# Patient Record
Sex: Female | Born: 1940
Health system: Southern US, Community
[De-identification: ages and names within clinical notes are randomized; demographics above are authoritative.]

## PROBLEM LIST (undated history)

## (undated) DIAGNOSIS — E785 Hyperlipidemia, unspecified: Secondary | ICD-10-CM

## (undated) DIAGNOSIS — F329 Major depressive disorder, single episode, unspecified: Secondary | ICD-10-CM

## (undated) DIAGNOSIS — F32A Depression, unspecified: Secondary | ICD-10-CM

## (undated) DIAGNOSIS — T7840XA Allergy, unspecified, initial encounter: Secondary | ICD-10-CM

## (undated) DIAGNOSIS — I10 Essential (primary) hypertension: Secondary | ICD-10-CM

## (undated) HISTORY — DX: Allergy, unspecified, initial encounter: T78.40XA

## (undated) HISTORY — PX: FRACTURE SURGERY: SHX138

## (undated) HISTORY — PX: TUBAL LIGATION: SHX77

## (undated) HISTORY — PX: DILATION AND CURETTAGE OF UTERUS: SHX78

## (undated) HISTORY — DX: Major depressive disorder, single episode, unspecified: F32.9

## (undated) HISTORY — DX: Depression, unspecified: F32.A

## (undated) HISTORY — PX: OTHER SURGICAL HISTORY: SHX169

---

## 1979-05-22 HISTORY — PX: OTHER SURGICAL HISTORY: SHX169

## 2005-07-06 ENCOUNTER — Ambulatory Visit: Payer: Self-pay | Admitting: Family Medicine

## 2005-07-20 ENCOUNTER — Ambulatory Visit: Payer: Self-pay | Admitting: Family Medicine

## 2005-07-21 HISTORY — PX: GASTROSCOPY: SHX1701

## 2007-03-15 LAB — HM COLONOSCOPY: HM Colonoscopy: NORMAL

## 2007-05-03 ENCOUNTER — Ambulatory Visit (HOSPITAL_COMMUNITY): Payer: Self-pay | Admitting: Psychiatry

## 2007-05-11 ENCOUNTER — Ambulatory Visit (HOSPITAL_COMMUNITY): Payer: Self-pay | Admitting: Psychiatry

## 2008-01-23 ENCOUNTER — Encounter: Payer: Self-pay | Admitting: Family Medicine

## 2008-02-28 ENCOUNTER — Ambulatory Visit (HOSPITAL_COMMUNITY): Payer: Self-pay | Admitting: Psychiatry

## 2008-03-19 ENCOUNTER — Ambulatory Visit (HOSPITAL_COMMUNITY): Payer: Self-pay | Admitting: Psychiatry

## 2008-03-26 ENCOUNTER — Ambulatory Visit (HOSPITAL_COMMUNITY): Payer: Self-pay | Admitting: Psychiatry

## 2008-03-28 ENCOUNTER — Ambulatory Visit (HOSPITAL_COMMUNITY): Payer: Self-pay | Admitting: Psychiatry

## 2008-04-03 ENCOUNTER — Encounter (INDEPENDENT_AMBULATORY_CARE_PROVIDER_SITE_OTHER): Payer: Self-pay | Admitting: *Deleted

## 2008-04-03 ENCOUNTER — Ambulatory Visit: Payer: Self-pay | Admitting: Family Medicine

## 2008-04-03 ENCOUNTER — Telehealth: Payer: Self-pay | Admitting: Family Medicine

## 2008-04-03 DIAGNOSIS — G43009 Migraine without aura, not intractable, without status migrainosus: Secondary | ICD-10-CM | POA: Insufficient documentation

## 2008-04-03 DIAGNOSIS — K5730 Diverticulosis of large intestine without perforation or abscess without bleeding: Secondary | ICD-10-CM | POA: Insufficient documentation

## 2008-04-03 DIAGNOSIS — K449 Diaphragmatic hernia without obstruction or gangrene: Secondary | ICD-10-CM | POA: Insufficient documentation

## 2008-04-03 DIAGNOSIS — E559 Vitamin D deficiency, unspecified: Secondary | ICD-10-CM | POA: Insufficient documentation

## 2008-04-03 DIAGNOSIS — N183 Chronic kidney disease, stage 3 unspecified: Secondary | ICD-10-CM | POA: Insufficient documentation

## 2008-04-03 DIAGNOSIS — M949 Disorder of cartilage, unspecified: Secondary | ICD-10-CM

## 2008-04-03 DIAGNOSIS — R609 Edema, unspecified: Secondary | ICD-10-CM | POA: Insufficient documentation

## 2008-04-03 DIAGNOSIS — E039 Hypothyroidism, unspecified: Secondary | ICD-10-CM | POA: Insufficient documentation

## 2008-04-03 DIAGNOSIS — M1990 Unspecified osteoarthritis, unspecified site: Secondary | ICD-10-CM | POA: Insufficient documentation

## 2008-04-03 DIAGNOSIS — K219 Gastro-esophageal reflux disease without esophagitis: Secondary | ICD-10-CM | POA: Insufficient documentation

## 2008-04-03 DIAGNOSIS — M899 Disorder of bone, unspecified: Secondary | ICD-10-CM | POA: Insufficient documentation

## 2008-04-03 DIAGNOSIS — A6000 Herpesviral infection of urogenital system, unspecified: Secondary | ICD-10-CM | POA: Insufficient documentation

## 2008-04-03 DIAGNOSIS — R110 Nausea: Secondary | ICD-10-CM | POA: Insufficient documentation

## 2008-04-03 DIAGNOSIS — R1013 Epigastric pain: Secondary | ICD-10-CM | POA: Insufficient documentation

## 2008-04-03 DIAGNOSIS — M810 Age-related osteoporosis without current pathological fracture: Secondary | ICD-10-CM | POA: Insufficient documentation

## 2008-04-03 DIAGNOSIS — I10 Essential (primary) hypertension: Secondary | ICD-10-CM | POA: Insufficient documentation

## 2008-04-04 LAB — CONVERTED CEMR LAB
Albumin: 4.4 g/dL (ref 3.5–5.2)
BUN: 11 mg/dL (ref 6–23)
CO2: 19 meq/L (ref 19–32)
Calcium: 9.4 mg/dL (ref 8.4–10.5)
Chloride: 106 meq/L (ref 96–112)
Glucose, Bld: 88 mg/dL (ref 70–99)
Lipase: 35 units/L (ref 0–75)
Potassium: 4.8 meq/L (ref 3.5–5.3)
Sodium: 140 meq/L (ref 135–145)
TSH: 2.252 microintl units/mL (ref 0.350–4.50)
Total Protein: 7.1 g/dL (ref 6.0–8.3)

## 2008-04-05 ENCOUNTER — Ambulatory Visit (HOSPITAL_COMMUNITY): Payer: Self-pay | Admitting: Psychiatry

## 2008-04-09 ENCOUNTER — Encounter: Admission: RE | Admit: 2008-04-09 | Discharge: 2008-04-09 | Payer: Self-pay | Admitting: Family Medicine

## 2008-04-15 ENCOUNTER — Encounter: Admission: RE | Admit: 2008-04-15 | Discharge: 2008-04-15 | Payer: Self-pay | Admitting: Family Medicine

## 2008-04-19 ENCOUNTER — Telehealth: Payer: Self-pay | Admitting: Family Medicine

## 2008-04-19 ENCOUNTER — Ambulatory Visit (HOSPITAL_COMMUNITY): Payer: Self-pay | Admitting: Psychiatry

## 2008-04-30 ENCOUNTER — Ambulatory Visit (HOSPITAL_COMMUNITY): Payer: Self-pay | Admitting: Psychiatry

## 2008-05-08 ENCOUNTER — Ambulatory Visit (HOSPITAL_COMMUNITY): Payer: Self-pay | Admitting: Psychiatry

## 2008-05-22 ENCOUNTER — Ambulatory Visit (HOSPITAL_COMMUNITY): Payer: Self-pay | Admitting: Psychiatry

## 2008-06-05 ENCOUNTER — Ambulatory Visit (HOSPITAL_COMMUNITY): Payer: Self-pay | Admitting: Psychiatry

## 2008-06-07 ENCOUNTER — Ambulatory Visit (HOSPITAL_COMMUNITY): Payer: Self-pay | Admitting: Psychiatry

## 2008-06-26 ENCOUNTER — Ambulatory Visit (HOSPITAL_COMMUNITY): Payer: Self-pay | Admitting: Psychiatry

## 2008-07-10 ENCOUNTER — Ambulatory Visit (HOSPITAL_COMMUNITY): Payer: Self-pay | Admitting: Psychiatry

## 2008-07-25 ENCOUNTER — Ambulatory Visit (HOSPITAL_COMMUNITY): Payer: Self-pay | Admitting: Psychiatry

## 2008-08-01 ENCOUNTER — Ambulatory Visit (HOSPITAL_COMMUNITY): Payer: Self-pay | Admitting: Psychiatry

## 2008-08-08 ENCOUNTER — Ambulatory Visit (HOSPITAL_COMMUNITY): Payer: Self-pay | Admitting: Psychiatry

## 2008-09-02 ENCOUNTER — Ambulatory Visit (HOSPITAL_COMMUNITY): Payer: Self-pay | Admitting: Psychiatry

## 2008-11-20 ENCOUNTER — Ambulatory Visit: Payer: Self-pay | Admitting: Family Medicine

## 2008-11-20 DIAGNOSIS — F341 Dysthymic disorder: Secondary | ICD-10-CM | POA: Insufficient documentation

## 2008-12-10 ENCOUNTER — Ambulatory Visit: Payer: Self-pay | Admitting: Family Medicine

## 2008-12-16 ENCOUNTER — Telehealth: Payer: Self-pay | Admitting: Family Medicine

## 2009-01-01 ENCOUNTER — Ambulatory Visit: Payer: Self-pay | Admitting: Family Medicine

## 2009-01-01 DIAGNOSIS — R252 Cramp and spasm: Secondary | ICD-10-CM | POA: Insufficient documentation

## 2009-01-02 ENCOUNTER — Telehealth (INDEPENDENT_AMBULATORY_CARE_PROVIDER_SITE_OTHER): Payer: Self-pay | Admitting: *Deleted

## 2009-01-02 LAB — CONVERTED CEMR LAB
AST: 19 units/L (ref 0–37)
Albumin: 4.5 g/dL (ref 3.5–5.2)
BUN: 15 mg/dL (ref 6–23)
Calcium: 9.7 mg/dL (ref 8.4–10.5)
Chloride: 109 meq/L (ref 96–112)
Folate: >20 ng/mL
Glucose, Bld: 97 mg/dL (ref 70–99)
HDL: 58 mg/dL (ref >39)
LDL Cholesterol: 150 mg/dL — ABNORMAL HIGH (ref 0–99)
Potassium: 4.5 meq/L (ref 3.5–5.3)
Sodium: 142 meq/L (ref 135–145)
TSH: 2.216 microintl units/mL (ref 0.350–4.500)
Total Protein: 7 g/dL (ref 6.0–8.3)
Triglycerides: 84 mg/dL (ref <150)
Vit D, 25-Hydroxy: 22 ng/mL — ABNORMAL LOW (ref 30–89)

## 2009-01-10 ENCOUNTER — Telehealth (INDEPENDENT_AMBULATORY_CARE_PROVIDER_SITE_OTHER): Payer: Self-pay | Admitting: *Deleted

## 2009-01-13 ENCOUNTER — Telehealth (INDEPENDENT_AMBULATORY_CARE_PROVIDER_SITE_OTHER): Payer: Self-pay | Admitting: *Deleted

## 2009-03-28 ENCOUNTER — Ambulatory Visit: Payer: Self-pay | Admitting: Family Medicine

## 2009-03-28 DIAGNOSIS — G252 Other specified forms of tremor: Secondary | ICD-10-CM

## 2009-03-28 DIAGNOSIS — E785 Hyperlipidemia, unspecified: Secondary | ICD-10-CM | POA: Insufficient documentation

## 2009-03-28 DIAGNOSIS — G250 Essential tremor: Secondary | ICD-10-CM | POA: Insufficient documentation

## 2009-04-01 LAB — CONVERTED CEMR LAB
Cholesterol, target level: 200 mg/dL
Cholesterol: 191 mg/dL (ref 0–200)
HDL goal, serum: 40 mg/dL
LDL Goal: 130 mg/dL
Magnesium: 2.3 mg/dL (ref 1.5–2.5)
Phosphorus: 2.5 mg/dL (ref 2.3–4.6)
Total CHOL/HDL Ratio: 3.4

## 2009-04-09 ENCOUNTER — Telehealth (INDEPENDENT_AMBULATORY_CARE_PROVIDER_SITE_OTHER): Payer: Self-pay | Admitting: *Deleted

## 2009-04-11 ENCOUNTER — Telehealth: Payer: Self-pay | Admitting: Family Medicine

## 2009-04-18 ENCOUNTER — Ambulatory Visit: Payer: Self-pay | Admitting: Family Medicine

## 2009-04-22 ENCOUNTER — Telehealth: Payer: Self-pay | Admitting: Family Medicine

## 2009-05-09 ENCOUNTER — Ambulatory Visit: Payer: Self-pay | Admitting: Family Medicine

## 2009-06-24 ENCOUNTER — Ambulatory Visit: Payer: Self-pay | Admitting: Family Medicine

## 2009-06-25 LAB — CONVERTED CEMR LAB
BUN: 10 mg/dL (ref 6–23)
Calcium: 9.6 mg/dL (ref 8.4–10.5)
Chloride: 106 meq/L (ref 96–112)
Creatinine, Ser: 1.11 mg/dL (ref 0.40–1.20)
Vit D, 25-Hydroxy: 25 ng/mL — ABNORMAL LOW (ref 30–89)

## 2009-08-28 ENCOUNTER — Encounter: Payer: Self-pay | Admitting: Family Medicine

## 2009-09-26 ENCOUNTER — Ambulatory Visit: Payer: Self-pay | Admitting: Family Medicine

## 2009-09-29 LAB — CONVERTED CEMR LAB
ALT: 16 units/L (ref 0–35)
AST: 18 units/L (ref 0–37)
Alkaline Phosphatase: 72 units/L (ref 39–117)
Bilirubin, Direct: 0.1 mg/dL (ref 0.0–0.3)
Cholesterol: 248 mg/dL — ABNORMAL HIGH (ref 0–200)
Indirect Bilirubin: 0.4 mg/dL (ref 0.0–0.9)
Total Protein: 6.5 g/dL (ref 6.0–8.3)
Triglycerides: 88 mg/dL (ref <150)

## 2010-03-19 ENCOUNTER — Encounter: Payer: Self-pay | Admitting: Family Medicine

## 2010-04-24 ENCOUNTER — Ambulatory Visit: Payer: Self-pay | Admitting: Family Medicine

## 2010-04-24 DIAGNOSIS — H8110 Benign paroxysmal vertigo, unspecified ear: Secondary | ICD-10-CM | POA: Insufficient documentation

## 2010-06-22 ENCOUNTER — Telehealth: Payer: Self-pay | Admitting: Family Medicine

## 2010-10-20 NOTE — Progress Notes (Signed)
Summary: Prior auth  Phone Note Call from Patient Call back at [facility] Phone [phone]   Summary of Call: Pt calls and states needs a prior auth on the acyclovir before insurance will pay for it. Please call for a prior auth on this so she can get her med Initial call taken by: Kathlene November LPN,  June 22, 2010 10:27 AM  Follow-up for Phone Call        Valcyclovir is what is on her med list.  Is it Cytogeneticist or acyclorvir.  Acyclovir is generic so usually that is the one they will pay for.  Follow-up by: Nani Gasser MD,  June 22, 2010 10:34 AM  Additional Follow-up for Phone Call Additional follow up Details #1::        Valcyclovir is what she normally gets but insurance doesnt want to cover it. Do you want to send the acyclovir if so i will let her know Additional Follow-up by: Kathlene November LPN,  June 22, 2010 10:41 AM    Additional Follow-up for Phone Call Additional follow up Details #2::    Ok, change med.  Follow-up by: Nani Gasser MD,  June 22, 2010 11:00 AM  Additional Follow-up for Phone Call Additional follow up Details #3:: Details for Additional Follow-up Action Taken: Pt notified of the med change Additional Follow-up by: Kathlene November LPN,  June 22, 2010 11:28 AM  New/Updated Medications: ACYCLOVIR 400 MG TABS (ACYCLOVIR) Take 1 tablet by mouth two times a day Prescriptions: ACYCLOVIR 400 MG TABS (ACYCLOVIR) Take 1 tablet by mouth two times a day  #60 x 11   Entered and Authorized by:   Nani Gasser MD   Signed by:   Nani Gasser MD on 06/22/2010   Method used:   [facility]        [facility] [phone]* (retail)       [address] [place], [place]  [address]       Ph: [phone] or [phone]       Fax: [phone]   RxID:   [id number]

## 2010-10-20 NOTE — Assessment & Plan Note (Signed)
Summary: 3 MO FU HTN, Anxiety, etc   Vital Signs:  Patient profile:   70 year old female Height:      58.75 inches Weight:      144 pounds BMI:     29.44 Pulse rate:   79 / minute BP sitting:   137 / 79  (left arm) Cuff size:   regular  Vitals Entered By: Kathlene November (September 26, 2009 9:16 AM) CC: check-up BP, Hypertension Management   Primary Care Provider:  Nani Gasser MD  CC:  check-up BP and Hypertension Management.  History of Present Illness: check-up BP.  Didn't take med this morning as was fasting.   Was really stressed and depressed at Christmas. Had stopped her seroquel  because was causing nightmares. Feels she is sleeping OK.  Still feels depressed during the day. Doesn't want to try abilify because her daughter didn't do well on it, but can't remember specific side effects.    Feels her treamor has gotten worse and would like to go up on her propranolol.  Says overall during the day not bad but still having symptoms at night. Says chaning to the XR hasnt' really helped her nightime sxs very much.    Hypertension History:      She denies headache, chest pain, palpitations, dyspnea with exertion, orthopnea, PND, peripheral edema, visual symptoms, neurologic problems, syncope, and side effects from treatment.  She notes no problems with any antihypertensive medication side effects.  Denies feeling bad on teh higher dose of propranolol. Marland Kitchen        Positive major cardiovascular risk factors include female age 18 years old or older, hyperlipidemia, and hypertension.  Negative major cardiovascular risk factors include no history of diabetes, negative family history for ischemic heart disease, and non-tobacco-user status.        Further assessment for target organ damage reveals no history of ASHD, stroke/TIA, or peripheral vascular disease.    Current Medications (verified): 1)  Valtrex 1 Gm  Tabs (Valacyclovir Hcl) .... Take 1 Tablet By Mouth Once A Day As Needed 2)   Topamax 100 Mg  Tabs (Topiramate) .... Take 1 Tablet By Mouth Once A Day At Bedtime 3)  Caltrate 600+d 600-400 Mg-Unit  Tabs (Calcium Carbonate-Vitamin D) .... Take 1 Tablet By Mouth Two Times A Day 4)  Zyrtec Allergy 10 Mg  Tabs (Cetirizine Hcl) .... Take Two By Mouth Daily 5)  B-2 Extra High Comp Hose Women   Misc (Elastic Bandages & Supports) .... Use As Directed 6)  Fluoxetine Hcl 40 Mg Caps (Fluoxetine Hcl) .... Take 1 Tablet By Mouth Once A Day 7)  Actonel 35 Mg Tabs (Risedronate Sodium) .... Take Oen Tablet By Mouth Once A Week 8)  Propranolol Hcl Cr 60 Mg Xr24h-Cap (Propranolol Hcl) .... Take 1 Tablet By Mouth Once A Day 9)  Simvastatin 20 Mg Tabs (Simvastatin) .... Take 1 Tablet By Mouth Once A Day At Bedtime  Allergies (verified): 1)  ! Codeine 2)  ! Darvocet 3)  ! Darvon 4)  ! Demerol 5)  ! Doxycycline 6)  ! * Guaifenesin 7)  ! * Metronidazole 8)  ! Morphine 9)  ! Pcn 10)  Seroquel Xr (Quetiapine Fumarate)  Comments:  Nurse/Medical Assistant: The patient's medications and allergies were reviewed with the patient and were updated in the Medication and Allergy Lists. Kathlene November (September 26, 2009 9:16 AM)  Physical Exam  General:  Well-developed,well-nourished,in no acute distress; alert,appropriate and cooperative throughout examination Head:  Normocephalic and atraumatic without obvious abnormalities. No apparent alopecia or balding. Eyes:  Wears glasses.  Lungs:  Normal respiratory effort, chest expands symmetrically. Lungs are clear to auscultation, no crackles or wheezes. Heart:  Normal rate and regular rhythm. S1 and S2 normal without gallop, murmur, click, rub or other extra sounds. Skin:  no rashes.   Psych:  Cognition and judgment appear intact. Alert and cooperative with normal attention span and concentration. No apparent delusions, illusions, hallucinations   Impression & Recommendations:  Problem # 1:  HYPERTENSION, BENIGN (ICD-401.1) Up a little today  but didn't take her medications. Due for cholesterol recheck.  Her updated medication list for this problem includes:    Propranolol Hcl Cr 80 Mg Xr24h-cap (Propranolol hcl) .Marland Kitchen... Take 1 tablet by mouth once a day  BP today: 137/79 Prior BP: 125/75 (06/24/2009)  Prior 10 Yr Risk Heart Disease: 13 % (04/01/2009)  Labs Reviewed: K+: 4.4 (06/24/2009) Creat: : 1.11 (06/24/2009)   Chol: 191 (03/28/2009)   HDL: 57 (03/28/2009)   LDL: 119 (03/28/2009)   TG: 75 (03/28/2009)  Problem # 2:  ANXIETY DEPRESSION (ICD-300.4) Assessment: Deteriorated Prefer not to use abilify because her daughter had side effects.  Will try risperdal since Tier 1 on her plan. Will start with really low dose since fluoxetine can increase the drug concentration of  the risperdal. Fu in one month. Call sooner if has any side effects.    Problem # 3:  FAMILIAL TREMOR (ICD-333.1) Assessment: Deteriorated Wants to try to increase her Propranolol. Feels her tremor is a little worse but has been more stressed and anxious over the Holidays.  If increase dose not helping or doesn't tolearate secondary to BP then will refer to Neurology for further treatment options.   Complete Medication List: 1)  Valtrex 1 Gm Tabs (Valacyclovir hcl) .... Take 1 tablet by mouth once a day as needed 2)  Topamax 100 Mg Tabs (Topiramate) .... Take 1 tablet by mouth once a day at bedtime 3)  Caltrate 600+d 600-400 Mg-unit Tabs (Calcium carbonate-vitamin d) .... Take 1 tablet by mouth two times a day 4)  Zyrtec Allergy 10 Mg Tabs (Cetirizine hcl) .... Take two by mouth daily 5)  B-2 Extra High Comp Hose Women Misc (Elastic bandages & supports) .... Use as directed 6)  Fluoxetine Hcl 40 Mg Caps (Fluoxetine hcl) .... Take 1 tablet by mouth once a day 7)  Actonel 35 Mg Tabs (Risedronate sodium) .... Take oen tablet by mouth once a week 8)  Propranolol Hcl Cr 80 Mg Xr24h-cap (Propranolol hcl) .... Take 1 tablet by mouth once a day 9)  Simvastatin 20 Mg  Tabs (Simvastatin) .... Take 1 tablet by mouth once a day at bedtime 10)  Risperidone 1 Mg Tabs (Risperidone) .... Take 1 tablet by mouth once a day at bedtime  Other Orders: T-Lipid Profile ([id number]) T-Hepatic Function [id number])  Hypertension Assessment/Plan:      The patient's hypertensive risk group is category B: At least one risk factor (excluding diabetes) with no target organ damage.  Her calculated 10 year risk of coronary heart disease is 9 %.  Today's blood pressure is 137/79.  Her blood pressure goal is < 140/90.  Prescriptions: RISPERIDONE 1 MG TABS (RISPERIDONE) Take 1 tablet by mouth once a day at bedtime  #30 x 0   Entered and Authorized by:   Nani Gasser MD   Signed by:   Nani Gasser MD on 09/26/2009   Method used:   [facility]  [place] [phone][facility] (retail)       [address] [place], [place]  [address]       Ph: [phone] or [phone]       Fax: [phone]   RxID:   [id number] PROPRANOLOL HCL CR 80 MG XR24H-CAP (PROPRANOLOL HCL) Take 1 tablet by mouth once a day  #30 x 2   Entered and Authorized by:   Nani Gasser MD   Signed by:   Nani Gasser MD on 09/26/2009   Method used:   [facility]        [facility] [phone]* (retail)       [address] [place], [place]  [address]       Ph: [phone] or [phone]       Fax: [phone]   RxID:   [id number] SIMVASTATIN 20 MG TABS (SIMVASTATIN) Take 1 tablet by mouth once a day at bedtime  #30 x 6   Entered and Authorized by:   Nani Gasser MD   Signed by:   Nani Gasser MD on 09/26/2009   Method used:   Electronically to        [facility] [phone]* (retail)       [address] [place], [place]  [address]       Ph: [phone] or [phone]       Fax: [phone]   RxID:   [id number] ABILIFY 10 MG TABS (ARIPIPRAZOLE) Take 1 tablet by mouth once a day at bedtime  #30 x 1   Entered and Authorized by:    Nani Gasser MD   Signed by:   Nani Gasser MD on 09/26/2009   Method used:   [facility]ronically to        [facility] [phone]* (retail)       [address] [place], [place]  [address]       Ph: [phone] or [phone]       Fax: [phone]   RxID:   [id number]   Flex Sig Next Due:  Not Indicated Hemoccult Next Due:  Not Indicated Bone Density Result Date:  04/09/2008 Bone Density Result:  abnormal Bone Density Next Due: 2 yr

## 2010-10-20 NOTE — Assessment & Plan Note (Signed)
Summary: BPV   Vital Signs:  Patient profile:   70 year old female Height:      58.75 inches Weight:      145 pounds Temp:     98.2 degrees F oral Pulse rate:   71 / minute BP sitting:   145 / 78  (left arm) Cuff size:   regular  Vitals Entered By: Kathlene November (April 24, 2010 3:26 PM) CC: wokeup this am with dizziness, nausea, when lays down feels like room is spinning   Primary Care Provider:  Nani Gasser MD  CC:  wokeup this am with dizziness, nausea, and when lays down feels like room is spinning.  History of Present Illness: wokeup this am with dizziness, nausea, when lays down feels like room is spinning.  did feel a little nauseated yesterday.  No fever.  No URI sxs. She is not sure if spinning is horizontal or vertical.  No vomiting. No worsenign or alleviating sxs.   Current Medications (verified): 1)  Valtrex 1 Gm  Tabs (Valacyclovir Hcl) .... Take 1 Tablet By Mouth Once A Day As Needed 2)  Topamax 100 Mg  Tabs (Topiramate) .... Take 1 Tablet By Mouth Once A Day At Bedtime 3)  Caltrate 600+d 600-400 Mg-Unit  Tabs (Calcium Carbonate-Vitamin D) .... Take 1 Tablet By Mouth Two Times A Day 4)  Zyrtec Allergy 10 Mg  Tabs (Cetirizine Hcl) .... Take Two By Mouth Daily 5)  B-2 Extra High Comp Hose Women   Misc (Elastic Bandages & Supports) .... Use As Directed 6)  Fluoxetine Hcl 40 Mg Caps (Fluoxetine Hcl) .... Take 1 Tablet By Mouth Once A Day 7)  Actonel 35 Mg Tabs (Risedronate Sodium) .... Take Oen Tablet By Mouth Once A Week 8)  Propranolol Hcl Cr 80 Mg Xr24h-Cap (Propranolol Hcl) .... Take 1 Tablet By Mouth Once A Day 9)  Simvastatin 20 Mg Tabs (Simvastatin) .... Take 1 Tablet By Mouth Once A Day At Bedtime 10)  Risperidone 1 Mg Tabs (Risperidone) .... Take 1 Tablet By Mouth Once A Day At Bedtime 11)  Cholestyramine 4 Gm Pack (Cholestyramine) .... One By Mouth Once Daily Mixed Wiht Glass of Water.  Allergies (verified): 1)  ! Codeine 2)  ! Darvocet 3)  !  Darvon 4)  ! Demerol 5)  ! Doxycycline 6)  ! * Guaifenesin 7)  ! * Metronidazole 8)  ! Morphine 9)  ! Pcn 10)  Seroquel Xr (Quetiapine Fumarate)  Comments:  Nurse/Medical Assistant: The patient's medications and allergies were reviewed with the patient and were updated in the Medication and Allergy Lists. Kathlene November (April 24, 2010 3:28 PM)  Physical Exam  General:  Well-developed,well-nourished,in no acute distress; alert,appropriate and cooperative throughout examination Head:  Normocephalic and atraumatic without obvious abnormalities. No apparent alopecia or balding. Eyes:  No corneal or conjunctival inflammation noted. EOMI. Perrla.  Ears:  External ear exam shows no significant lesions or deformities.  Otoscopic examination reveals clear canals, tympanic membranes are intact bilaterally without bulging, retraction, inflammation or discharge. Hearing is grossly normal bilaterally. Nose:  External nasal examination shows no deformity or inflammation. Nasal mucosa are pink and moist without lesions or exudates. Mouth:  Oral mucosa and oropharynx without lesions or exudates.  Teeth in good repair. Neck:  No deformities, masses, or tenderness noted. Lungs:  Normal respiratory effort, chest expands symmetrically. Lungs are clear to auscultation, no crackles or wheezes. Heart:  Normal rate and regular rhythm. S1 and S2 normal without gallop,  murmur, click, rub or other extra sounds. Neurologic:  alert & oriented X3, cranial nerves II-XII intact, and gait normal.  Dix-Hallpike + to the right.    Impression & Recommendations:  Problem # 1:  BENIGN POSITIONAL VERTIGO (ICD-386.11) Discussed dx.  Her updated medication list for this problem includes:    Zyrtec Allergy 10 Mg Tabs (Cetirizine hcl) .Marland Kitchen... Take two by mouth daily    Meclizine Hcl 25 Mg Tabs (Meclizine hcl) .Marland Kitchen... Take 1 tablet by mouth three times a day as needed for dizziness.    Promethazine Hcl 25 Mg Tabs (Promethazine  hcl) .Marland Kitchen... Take 1 tablet by mouth three times a day as needed nausea  Demonstrated maneuvers to self-treat vertigo. Patient to call to be seen if no improvement in 10-14 days, sooner if worse.   Complete Medication List: 1)  Valtrex 1 Gm Tabs (Valacyclovir hcl) .... Take 1 tablet by mouth once a day as needed 2)  Topamax 100 Mg Tabs (Topiramate) .... Take 1 tablet by mouth once a day at bedtime 3)  Caltrate 600+d 600-400 Mg-unit Tabs (Calcium carbonate-vitamin d) .... Take 1 tablet by mouth two times a day 4)  Zyrtec Allergy 10 Mg Tabs (Cetirizine hcl) .... Take two by mouth daily 5)  B-2 Extra High Comp Hose Women Misc (Elastic bandages & supports) .... Use as directed 6)  Fluoxetine Hcl 40 Mg Caps (Fluoxetine hcl) .... Take 1 tablet by mouth once a day 7)  Actonel 35 Mg Tabs (Risedronate sodium) .... Take oen tablet by mouth once a week 8)  Propranolol Hcl Cr 80 Mg Xr24h-cap (Propranolol hcl) .... Take 1 tablet by mouth once a day 9)  Simvastatin 20 Mg Tabs (Simvastatin) .... Take 1 tablet by mouth once a day at bedtime 10)  Risperidone 1 Mg Tabs (Risperidone) .... Take 1 tablet by mouth once a day at bedtime 11)  Cholestyramine 4 Gm Pack (Cholestyramine) .... One by mouth once daily mixed wiht glass of water. 12)  Meclizine Hcl 25 Mg Tabs (Meclizine hcl) .... Take 1 tablet by mouth three times a day as needed for dizziness. 13)  Promethazine Hcl 25 Mg Tabs (Promethazine hcl) .... Take 1 tablet by mouth three times a day as needed nausea  Patient Instructions: 1)  Can take meclizine for the vertigo and take the phenergan for nausea.  2)  do the exercises 10 times 3-4 x a day to improve your symptoms.  3)  If still having vertigo in 3 weeks then please follow-up.  Prescriptions: PROPRANOLOL HCL CR 80 MG XR24H-CAP (PROPRANOLOL HCL) Take 1 tablet by mouth once a day  #30 x 2   Entered and Authorized by:   Nani Gasser MD   Signed by:   Nani Gasser MD on 04/24/2010   Method  used:   [facility]ronically to        [facility] [phone]* (retail)       [address] [place], [place]  [address]       Ph: [phone] or [phone]       Fax: [phone]   RxID:   [id number] PROMETHAZINE HCL 25 MG TABS (PROMETHAZINE HCL) Take 1 tablet by mouth three times a day as needed nausea  #10 x 0   Entered and Authorized by:   Nani Gasser MD   Signed by:   Nani Gasser MD on 04/24/2010   Method used:   [facility]ronically to  [facility]  [place] [phone]* (retail)       [address] [place], [place]  [address]       Ph: [phone] or [phone]       Fax: [phone]   RxID:   [id number] MECLIZINE HCL 25 MG TABS (MECLIZINE HCL) Take 1 tablet by mouth three times a day as needed for dizziness.  #20 x 0   Entered and Authorized by:   Nani Gasser MD   Signed by:   Nani Gasser MD on 04/24/2010   Method used:   Electronically to        [facility] [phone]* (retail)       [address] [place], [place]  [address]       Ph: [phone] or [phone]       Fax: [phone]   RxID:   [id number]

## 2010-10-20 NOTE — Letter (Signed)
Summary: Patient Med & Problem List/[facility]  Patient Med & Problem List/[facility]   Imported By: Lanelle Bal 04/03/2010 12:39:48  _____________________________________________________________________  External Attachment:    Type:   Image     Comment:   External Document

## 2010-11-12 ENCOUNTER — Encounter: Payer: Self-pay | Admitting: Family Medicine

## 2010-12-08 NOTE — Letter (Signed)
Summary: [facility] [employer] [place]   Imported By: Maryln Gottron 12/02/2010 09:17:20  _____________________________________________________________________  External Attachment:    Type:   Image     Comment:   External Document

## 2010-12-22 ENCOUNTER — Encounter: Payer: Self-pay | Admitting: Family Medicine

## 2010-12-24 ENCOUNTER — Ambulatory Visit: Payer: Self-pay | Admitting: Family Medicine

## 2011-01-10 ENCOUNTER — Other Ambulatory Visit: Payer: Self-pay | Admitting: Family Medicine

## 2011-07-10 ENCOUNTER — Other Ambulatory Visit: Payer: Self-pay | Admitting: Family Medicine

## 2011-08-06 ENCOUNTER — Other Ambulatory Visit: Payer: Self-pay | Admitting: Family Medicine

## 2011-08-23 DIAGNOSIS — M25569 Pain in unspecified knee: Secondary | ICD-10-CM | POA: Insufficient documentation

## 2011-08-23 DIAGNOSIS — F419 Anxiety disorder, unspecified: Secondary | ICD-10-CM | POA: Insufficient documentation

## 2011-09-23 DIAGNOSIS — B009 Herpesviral infection, unspecified: Secondary | ICD-10-CM | POA: Insufficient documentation

## 2011-12-13 DIAGNOSIS — Z9049 Acquired absence of other specified parts of digestive tract: Secondary | ICD-10-CM | POA: Insufficient documentation

## 2011-12-13 DIAGNOSIS — R61 Generalized hyperhidrosis: Secondary | ICD-10-CM | POA: Insufficient documentation

## 2012-09-17 ENCOUNTER — Other Ambulatory Visit: Payer: Self-pay | Admitting: Family Medicine

## 2012-09-18 ENCOUNTER — Other Ambulatory Visit: Payer: Self-pay | Admitting: Family Medicine

## 2013-01-19 ENCOUNTER — Other Ambulatory Visit: Payer: Self-pay | Admitting: Family Medicine

## 2014-07-11 ENCOUNTER — Encounter: Payer: Self-pay | Admitting: Emergency Medicine

## 2014-07-11 ENCOUNTER — Emergency Department
Admission: EM | Admit: 2014-07-11 | Discharge: 2014-07-11 | Disposition: A | Discharge status: Home / Self Care | Payer: Medicare Other | Source: Home / Self Care | Attending: Emergency Medicine | Admitting: Emergency Medicine

## 2014-07-11 ENCOUNTER — Emergency Department (INDEPENDENT_AMBULATORY_CARE_PROVIDER_SITE_OTHER): Payer: Medicare Other

## 2014-07-11 DIAGNOSIS — M479 Spondylosis, unspecified: Secondary | ICD-10-CM

## 2014-07-11 DIAGNOSIS — R05 Cough: Secondary | ICD-10-CM

## 2014-07-11 DIAGNOSIS — R059 Cough, unspecified: Secondary | ICD-10-CM

## 2014-07-11 DIAGNOSIS — J208 Acute bronchitis due to other specified organisms: Secondary | ICD-10-CM

## 2014-07-11 HISTORY — DX: Hyperlipidemia, unspecified: E78.5

## 2014-07-11 HISTORY — DX: Essential (primary) hypertension: I10

## 2014-07-11 MED ORDER — AZITHROMYCIN 250 MG PO TABS: ORAL_TABLET | ORAL | Status: DC

## 2014-07-11 NOTE — ED Notes (Signed)
Reports 4 day history of congestion, cough, body aches and head ache; denies fever. Has not had Flu shot this season.

## 2014-07-11 NOTE — Discharge Instructions (Signed)

## 2014-07-11 NOTE — ED Provider Notes (Signed)
CSN: [id number]     Arrival date & time 07/11/14  1459 History   First MD Initiated Contact with Patient 07/11/14 1543     Chief Complaint  Patient presents with  . Cough  . Nasal Congestion  . Headache  . Sore Throat   (Consider location/radiation/quality/duration/timing/severity/associated sxs/prior Treatment) Patient is a 73 y.o. female presenting with cough, headaches, and pharyngitis. The history is provided by the patient. No language interpreter was used.  Cough Cough characteristics:  Productive Sputum characteristics:  Green Severity:  Moderate Onset quality:  Gradual Duration:  4 days Timing:  Constant Progression:  Worsening Chronicity:  New Smoker: no   Context: upper respiratory infection   Relieved by:  Nothing Worsened by:  Nothing tried Associated symptoms: headaches   Risk factors: no recent infection   Headache Associated symptoms: cough   Sore Throat Associated symptoms include headaches.    Past Medical History  Diagnosis Date  . Allergy   . Depression     long standing  . Hyperlipidemia   . Hypertension    Past Surgical History  Procedure Laterality Date  . Carpel tunnel release, both hands  1980's  . Trigger finger release, left finger    . Dilation and curettage of uterus    . Gastroscopy  11-06  . Tubal ligation      bilateral   Family History  Problem Relation Age of Onset  . Diabetes Mother   . Hyperlipidemia Mother   . Heart disease Mother 3521  . Diabetes Father   . Hyperlipidemia Father   . Tremor Father     hand- started in his 3150's  . Tremor Cousin    History  Substance Use Topics  . Smoking status: Never Smoker   . Smokeless tobacco: Not on file  . Alcohol Use: No   OB History   Grav Para Term Preterm Abortions TAB SAB Ect Mult Living                 Review of Systems  Respiratory: Positive for cough.   Neurological: Positive for headaches.  All other systems reviewed and are negative.   Allergies  Codeine;  Doxycycline; Guaifenesin; Meperidine hcl; Metronidazole; Morphine; and Penicillins  Home Medications   Prior to Admission medications   Medication Sig Start Date End Date Taking? Authorizing Provider  atorvastatin (LIPITOR) 40 MG tablet Take 40 mg by mouth daily.   Yes Historical Provider, MD  SUMAtriptan (IMITREX) 50 MG tablet Take 50 mg by mouth every 2 (two) hours as needed for migraine or headache. May repeat in 2 hours if headache persists or recurs.   Yes Historical Provider, MD  acyclovir (ZOVIRAX) 400 MG tablet TAKE 1 TABLET BY MOUTH TWO TIMES A DAY 09/18/12   Agapito Gamesatherine D Metheney, MD  Calcium Carbonate-Vitamin D (CALTRATE 600+D) 600-400 MG-UNIT per tablet Take 1 tablet by mouth 2 (two) times daily.      Historical Provider, MD  cetirizine (ZYRTEC) 10 MG tablet Take 10 mg by mouth 2 (two) times daily.      Historical Provider, MD  cholestyramine Lanetta Inch(QUESTRAN) 4 G packet Take by mouth.      Historical Provider, MD  FLUoxetine (PROZAC) 40 MG capsule Take 40 mg by mouth daily.      Historical Provider, MD  meclizine (ANTIVERT) 25 MG tablet Take 25 mg by mouth 3 (three) times daily as needed. For dizziness     Historical Provider, MD  NON FORMULARY B-2 Extra high comp hose women (elastic  bandages and support)     Historical Provider, MD  promethazine (PHENERGAN) 25 MG tablet Take 25 mg by mouth 3 (three) times daily as needed. For nausea     Historical Provider, MD  propranolol (INDERAL LA) 80 MG 24 hr capsule Take 80 mg by mouth daily.      Historical Provider, MD  risedronate (ACTONEL) 35 MG tablet Take 35 mg by mouth every 7 (seven) days. with water on empty stomach, nothing by mouth or lie down for next 30 minutes.     Historical Provider, MD  risperiDONE (RISPERDAL) 1 MG tablet Take 1 mg by mouth at bedtime.      Historical Provider, MD  sertraline (ZOLOFT) 100 MG tablet TAKE 1 TABLET EVERY DAY 07/10/11   Agapito Gamesatherine D Metheney, MD  simvastatin (ZOCOR) 20 MG tablet Take 20 mg by mouth at  bedtime.      Historical Provider, MD  topiramate (TOPAMAX) 100 MG tablet TAKE 1 TABLET AT BEDTIME 01/10/11   Agapito Gamesatherine D Metheney, MD   BP 142/83  Pulse 89  Temp(Src) 98.1 F (36.7 C) (Oral)  Resp 16  Ht 5\' 2"  (1.575 m)  Wt 159 lb (72.122 kg)  BMI 29.07 kg/m2  SpO2 97% Physical Exam  Nursing note and vitals reviewed. Constitutional: She is oriented to person, place, and time. She appears well-developed and well-nourished.  HENT:  Head: Normocephalic and atraumatic.  Eyes: Conjunctivae and EOM are normal. Pupils are equal, round, and reactive to light.  Neck: Normal range of motion.  Cardiovascular: Normal rate and regular rhythm.   Pulmonary/Chest: Effort normal and breath sounds normal. She has no wheezes. She exhibits no tenderness.  Harsh wet sounding cough  Abdominal: She exhibits no distension.  Musculoskeletal: Normal range of motion.  Neurological: She is alert and oriented to person, place, and time.  Skin: Skin is warm.  Psychiatric: She has a normal mood and affect.    ED Course  Procedures (including critical care time) Labs Review Labs Reviewed - No data to display  Imaging Review Dg Chest 2 View  07/11/2014   CLINICAL DATA:  Cough and congestion for 2 days.  EXAM: CHEST  2 VIEW  COMPARISON:  04/15/2008  FINDINGS: The lungs appear clear.  Cardiac and mediastinal contours normal.  No pleural effusion identified.  Mild thoracic spondylosis.  Bony demineralization noted.  IMPRESSION: 1. No acute findings. 2. Mild thoracic spondylosis. 3. Bony demineralization.   Electronically Signed   By: Herbie BaltimoreWalt  Liebkemann M.D.   On: 07/11/2014 16:12     MDM no pneumonia or fluid on chest xray.   Pt reports she usually takes zithromax when she gets like this   1. Cough   AVS Zithromax See Dr. Linford ArnoldMetheney for recheck in 1 week.   Pt counseled on sign and symptoms of worsening illness    Nancy AreasLeslie K Sofia, PA-C 07/11/14 1703

## 2014-07-12 NOTE — ED Provider Notes (Signed)
Medical history/examination/treatment/procedure(s) were performed by non-physician provider and as supervising physician I was immediately available for consultation/collaboration.  David Massey, MD 07/12/14 1246 

## 2014-07-17 ENCOUNTER — Encounter: Payer: Self-pay | Admitting: Emergency Medicine

## 2014-07-17 ENCOUNTER — Emergency Department (INDEPENDENT_AMBULATORY_CARE_PROVIDER_SITE_OTHER)
Admission: EM | Admit: 2014-07-17 | Discharge: 2014-07-17 | Disposition: A | Discharge status: Home / Self Care | Payer: Medicare Other | Source: Home / Self Care | Attending: Family Medicine | Admitting: Family Medicine

## 2014-07-17 DIAGNOSIS — R0982 Postnasal drip: Secondary | ICD-10-CM

## 2014-07-17 DIAGNOSIS — R05 Cough: Secondary | ICD-10-CM

## 2014-07-17 DIAGNOSIS — R058 Other specified cough: Secondary | ICD-10-CM

## 2014-07-17 MED ORDER — BENZONATATE 100 MG PO CAPS: 100.0000 mg | ORAL_CAPSULE | Freq: Three times a day (TID) | ORAL | Status: DC | PRN

## 2014-07-17 MED ORDER — IPRATROPIUM BROMIDE 0.06 % NA SOLN: 2.0000 | Freq: Four times a day (QID) | NASAL | Status: DC

## 2014-07-17 NOTE — Discharge Instructions (Signed)
Thank you for coming in today. Take Tessalon Perles as needed for cough. Use Atrovent nasal spray. Take Tylenol for pain or fever. Call or go to the emergency room if you get worse, have trouble breathing, have chest pains, or palpitations.     Cough, Adult  A cough is a reflex that helps clear your throat and airways. It can help heal the body or may be a reaction to an irritated airway. A cough may only last 2 or 3 weeks (acute) or may last more than 8 weeks (chronic).  CAUSES Acute cough:  Viral or bacterial infections. Chronic cough:  Infections.  Allergies.  Asthma.  Post-nasal drip.  Smoking.  Heartburn or acid reflux.  Some medicines.  Chronic lung problems (COPD).  Cancer. SYMPTOMS   Cough.  Fever.  Chest pain.  Increased breathing rate.  High-pitched whistling sound when breathing (wheezing).  Colored mucus that you cough up (sputum). TREATMENT   A bacterial cough may be treated with antibiotic medicine.  A viral cough must run its course and will not respond to antibiotics.  Your caregiver may recommend other treatments if you have a chronic cough. HOME CARE INSTRUCTIONS   Only take over-the-counter or prescription medicines for pain, discomfort, or fever as directed by your caregiver. Use cough suppressants only as directed by your caregiver.  Use a cold steam vaporizer or humidifier in your bedroom or home to help loosen secretions.  Sleep in a semi-upright position if your cough is worse at night.  Rest as needed.  Stop smoking if you smoke. SEEK IMMEDIATE MEDICAL CARE IF:   You have pus in your sputum.  Your cough starts to worsen.  You cannot control your cough with suppressants and are losing sleep.  You begin coughing up blood.  You have difficulty breathing.  You develop pain which is getting worse or is uncontrolled with medicine.  You have a fever. MAKE SURE YOU:   Understand these instructions.  Will watch your  condition.  Will get help right away if you are not doing well or get worse. Document Released: 03/05/2011 Document Revised: 11/29/2011 Document Reviewed: 03/05/2011 [facility] Patient Information 2015 [place], [place]. This information is not intended to replace advice given to you by your health care provider. Make sure you discuss any questions you have with your health care provider.

## 2014-07-17 NOTE — ED Provider Notes (Signed)
Nancy CasaBarbara F Wilson is a 73 y.o. female who presents to Urgent Care today for cough. Patient has cough sore throat and mild nausea. She's been symptomatic now for 10 days. She was seen on the second for the same complaint were a chest x-ray was normal. She was given azithromycin as this has worked in the past. She notes that her symptoms persist. No fevers or chills or trouble breathing.   Past Medical History  Diagnosis Date  . Allergy   . Depression     long standing  . Hyperlipidemia   . Hypertension    History  Substance Use Topics  . Smoking status: Never Smoker   . Smokeless tobacco: Not on file  . Alcohol Use: No   ROS as above Medications: No current facility-administered medications for this encounter.   Current Outpatient Prescriptions  Medication Sig Dispense Refill  . acyclovir (ZOVIRAX) 400 MG tablet TAKE 1 TABLET BY MOUTH TWO TIMES A DAY  60 tablet  0  . atorvastatin (LIPITOR) 40 MG tablet Take 40 mg by mouth daily.      . benzonatate (TESSALON) 100 MG capsule Take 1 capsule (100 mg total) by mouth 3 (three) times daily as needed for cough.  30 capsule  0  . Calcium Carbonate-Vitamin D (CALTRATE 600+D) 600-400 MG-UNIT per tablet Take 1 tablet by mouth 2 (two) times daily.        . cetirizine (ZYRTEC) 10 MG tablet Take 10 mg by mouth 2 (two) times daily.        . cholestyramine (QUESTRAN) 4 G packet Take by mouth.        Marland Kitchen. FLUoxetine (PROZAC) 40 MG capsule Take 40 mg by mouth daily.        Marland Kitchen. ipratropium (ATROVENT) 0.06 % nasal spray Place 2 sprays into both nostrils 4 (four) times daily.  15 mL  1  . meclizine (ANTIVERT) 25 MG tablet Take 25 mg by mouth 3 (three) times daily as needed. For dizziness       . NON FORMULARY B-2 Extra high comp hose women (elastic bandages and support)       . promethazine (PHENERGAN) 25 MG tablet Take 25 mg by mouth 3 (three) times daily as needed. For nausea       . propranolol (INDERAL LA) 80 MG 24 hr capsule Take 80 mg by mouth daily.         . risedronate (ACTONEL) 35 MG tablet Take 35 mg by mouth every 7 (seven) days. with water on empty stomach, nothing by mouth or lie down for next 30 minutes.       . risperiDONE (RISPERDAL) 1 MG tablet Take 1 mg by mouth at bedtime.        . sertraline (ZOLOFT) 100 MG tablet TAKE 1 TABLET EVERY DAY  30 tablet  2  . simvastatin (ZOCOR) 20 MG tablet Take 20 mg by mouth at bedtime.        . SUMAtriptan (IMITREX) 50 MG tablet Take 50 mg by mouth every 2 (two) hours as needed for migraine or headache. May repeat in 2 hours if headache persists or recurs.      . topiramate (TOPAMAX) 100 MG tablet TAKE 1 TABLET AT BEDTIME  30 tablet  1    Exam:  BP 124/81  Pulse 81  Temp(Src) 98.3 F (36.8 C) (Oral)  Ht 5' (1.524 m)  Wt 166 lb (75.297 kg)  BMI 32.42 kg/m2  SpO2 95% Gen: Well NAD HEENT: EOMI,  MMM posterior pharynx with cobblestoning. Normal tympanic membranes and nasal turbinates bilaterally. Lungs: Normal work of breathing. CTABL Heart: RRR no MRG Abd: NABS, Soft. Nondistended, Nontender Exts: Brisk capillary refill, warm and well perfused.   No results found for this or any previous visit (from the past 24 hour(s)). No results found.  Assessment and Plan: 73 y.o. female with postviral cough and postnasal drip. Treatment with Atrovent. Treat cough with Tessalon as patient is allergic to both codeine and morphine. Follow-up with primary care provider as needed.  Discussed warning signs or symptoms. Please see discharge instructions. Patient expresses understanding.     Rodolph BongEvan S Clyda Smyth, MD 07/17/14 [phone]

## 2014-07-17 NOTE — ED Notes (Signed)
Cough, sore throat, slight nausea x 10 days finished z-pak on Monday, not better

## 2014-08-01 ENCOUNTER — Emergency Department
Admission: EM | Admit: 2014-08-01 | Discharge: 2014-08-01 | Disposition: A | Discharge status: Home / Self Care | Payer: Medicare Other | Source: Home / Self Care | Attending: Family Medicine | Admitting: Family Medicine

## 2014-08-01 ENCOUNTER — Encounter: Payer: Self-pay | Admitting: Emergency Medicine

## 2014-08-01 ENCOUNTER — Emergency Department: Payer: Medicare Other

## 2014-08-01 DIAGNOSIS — R252 Cramp and spasm: Secondary | ICD-10-CM

## 2014-08-01 DIAGNOSIS — M722 Plantar fascial fibromatosis: Secondary | ICD-10-CM

## 2014-08-01 DIAGNOSIS — R609 Edema, unspecified: Secondary | ICD-10-CM

## 2014-08-01 DIAGNOSIS — M79605 Pain in left leg: Secondary | ICD-10-CM

## 2014-08-01 DIAGNOSIS — I8393 Asymptomatic varicose veins of bilateral lower extremities: Secondary | ICD-10-CM

## 2014-08-01 DIAGNOSIS — M79604 Pain in right leg: Secondary | ICD-10-CM

## 2014-08-01 MED ORDER — METAXALONE 800 MG PO TABS: ORAL_TABLET | ORAL | Status: DC

## 2014-08-01 NOTE — ED Notes (Signed)
Bi lateral foot pain, swelling, Right leg positive for blood clot, cramping at night, painful especially when walking.5/10 when sitting goes to 8 when walking

## 2014-08-01 NOTE — ED Provider Notes (Signed)
CSN: [id number]     Arrival date & time 08/01/14  1401 History   First MD Initiated Contact with Patient 08/01/14 1418     Chief Complaint  Patient presents with  . Leg Pain      HPI Comments: Patient states that she has had a long history of varicose veins.  On May 16, 2014, she noted increased pain in her right lower leg, and she proceeded to an ER for evaluation.  Lower extremity US was negative for DVT, and she was diagnosed with superficial venous thrombosis. She has never used support hose for her condition. Over the past 2 to 3 days, she has developed pain in both heels, worse when standing.  She recalls no trauma, and no increase in physical activities such as increased walking.  She also complains of 2 to 3 year history of nocturnal leg cramps. She feels well otherwise.  No chest pain or shortness of breath.  Patient is a 73 y.o. female presenting with leg pain. The history is provided by the patient.  Leg Pain Location:  Leg and foot Time since incident:  3 days Leg location:  L upper leg, R upper leg, L lower leg and R lower leg Foot location:  L foot and R foot Pain details:    Quality:  Aching   Radiates to:  Does not radiate   Severity:  Mild   Onset quality:  Gradual   Duration:  3 days   Timing:  Constant   Progression:  Unchanged Chronicity:  New Relieved by: rest and elevation. Worsened by:  Bearing weight Ineffective treatments:  None tried Associated symptoms: swelling   Associated symptoms: no back pain, no fever, no muscle weakness, no numbness and no tingling   Risk factors: obesity     Past Medical History  Diagnosis Date  . Allergy   . Depression     long standing  . Hyperlipidemia   . Hypertension    Past Surgical History  Procedure Laterality Date  . Carpel tunnel release, both hands  1980's  . Trigger finger release, left finger    . Dilation and curettage of uterus    . Gastroscopy  11-06  . Tubal ligation      bilateral   Family  History  Problem Relation Age of Onset  . Diabetes Mother   . Hyperlipidemia Mother   . Heart disease Mother [id number]  . Diabetes Father   . Hyperlipidemia Father   . Tremor Father     hand- started in his 5950's  . Tremor Cousin    History  Substance Use Topics  . Smoking status: Never Smoker   . Smokeless tobacco: Not on file  . Alcohol Use: No   OB History    No data available     Review of Systems  Constitutional: Negative for fever.  HENT: Negative.   Eyes: Negative.   Respiratory: Negative for shortness of breath and wheezing.   Cardiovascular: Negative for chest pain.  Gastrointestinal: Negative.   Genitourinary: Negative.   Musculoskeletal: Negative for back pain.  Skin: Negative.   All other systems reviewed and are negative.   Allergies  Codeine; Doxycycline; Guaifenesin; Meperidine hcl; Metronidazole; Morphine; and Penicillins  Home Medications   Prior to Admission medications   Medication Sig Start Date End Date Taking? Authorizing Provider  acyclovir (ZOVIRAX) 400 MG tablet TAKE 1 TABLET BY MOUTH TWO TIMES A DAY 09/18/12   Agapito Gamesatherine D Metheney, MD  atorvastatin (LIPITOR) 40 MG tablet  Take 40 mg by mouth daily.    Historical Provider, MD  benzonatate (TESSALON) 100 MG capsule Take 1 capsule (100 mg total) by mouth 3 (three) times daily as needed for cough. 07/17/14   Rodolph Bong, MD  Calcium Carbonate-Vitamin D (CALTRATE 600+D) 600-400 MG-UNIT per tablet Take 1 tablet by mouth 2 (two) times daily.      Historical Provider, MD  cetirizine (ZYRTEC) 10 MG tablet Take 10 mg by mouth 2 (two) times daily.      Historical Provider, MD  cholestyramine Lanetta Inch) 4 G packet Take by mouth.      Historical Provider, MD  FLUoxetine (PROZAC) 40 MG capsule Take 40 mg by mouth daily.      Historical Provider, MD  ipratropium (ATROVENT) 0.06 % nasal spray Place 2 sprays into both nostrils 4 (four) times daily. 07/17/14   Rodolph Bong, MD  meclizine (ANTIVERT) 25 MG tablet Take  25 mg by mouth 3 (three) times daily as needed. For dizziness     Historical Provider, MD  metaxalone (SKELAXIN) 800 MG tablet Take one tab by mouth at bedtime as needed for leg cramps 08/01/14   Lattie Haw, MD  NON FORMULARY B-2 Extra high comp hose women (elastic bandages and support)     Historical Provider, MD  promethazine (PHENERGAN) 25 MG tablet Take 25 mg by mouth 3 (three) times daily as needed. For nausea     Historical Provider, MD  propranolol (INDERAL LA) 80 MG 24 hr capsule Take 80 mg by mouth daily.      Historical Provider, MD  risedronate (ACTONEL) 35 MG tablet Take 35 mg by mouth every 7 (seven) days. with water on empty stomach, nothing by mouth or lie down for next 30 minutes.     Historical Provider, MD  risperiDONE (RISPERDAL) 1 MG tablet Take 1 mg by mouth at bedtime.      Historical Provider, MD  sertraline (ZOLOFT) 100 MG tablet TAKE 1 TABLET EVERY DAY 07/10/11   Agapito Games, MD  simvastatin (ZOCOR) 20 MG tablet Take 20 mg by mouth at bedtime.      Historical Provider, MD  SUMAtriptan (IMITREX) 50 MG tablet Take 50 mg by mouth every 2 (two) hours as needed for migraine or headache. May repeat in 2 hours if headache persists or recurs.    Historical Provider, MD  topiramate (TOPAMAX) 100 MG tablet TAKE 1 TABLET AT BEDTIME 01/10/11   Agapito Games, MD   BP 163/80 mmHg  Pulse 89  Temp(Src) 98.3 F (36.8 C) (Oral)  Ht 5' (1.524 m)  Wt 163 lb (73.936 kg)  BMI 31.83 kg/m2  SpO2 96% Physical Exam Nursing notes and Vital Signs reviewed. Appearance:  Patient appears stated age, and in no acute distress.  Patient is obese (BMI 31.8) Eyes:  Pupils are equal, round, and reactive to light and accomodation.  Extraocular movement is intact.  Conjunctivae are not inflamed   Neck:  Supple.  No adenopathy Lungs:  Clear to auscultation.  Breath sounds are equal.  Heart:  Regular rate and rhythm without murmurs, rubs, or gallops.  Abdomen:  Nontender without masses  or hepatosplenomegaly.  Bowel sounds are present.  No CVA or flank tenderness.  Extremities:  Trace edema.  Diffuse superficial varicosities both legs, somewhat worse on the right.  Varicosities are not tender to palpation.  No erythema or warmth.  Posterior calves are tender to palpation.  Homan's test negative. Both heels have distinct tenderness over  plantar surfaces. Skin:  No rash present. Pedal pulses present  ED Course  Procedures none  Labs Reviewed  BASIC METABOLIC PANEL    Imaging Review [place] Venous Img Lower Bilateral  08/01/2014   CLINICAL DATA:  Two day history of bilateral lower extremity pain  EXAM: BILATERAL LOWER EXTREMITY VENOUS DUPLEX ULTRASOUND  TECHNIQUE: Gray-scale sonography with graded compression, as well as color Doppler and duplex ultrasound were performed to evaluate the lower extremity deep venous systems from the level of the common femoral vein and including the common femoral, femoral, profunda femoral, popliteal and calf veins including the posterior tibial, peroneal and gastrocnemius veins when visible. The superficial great saphenous vein was also interrogated. Spectral Doppler was utilized to evaluate flow at rest and with distal augmentation maneuvers in the common femoral, femoral and popliteal veins.  COMPARISON:  None.  FINDINGS: RIGHT LOWER EXTREMITY  Common Femoral Vein: No evidence of thrombus. Normal compressibility, respiratory phasicity and response to augmentation.  Saphenofemoral Junction: No evidence of thrombus. Normal compressibility and flow on color Doppler imaging.  Profunda Femoral Vein: No evidence of thrombus. Normal compressibility and flow on color Doppler imaging.  Femoral Vein: No evidence of thrombus. Normal compressibility, respiratory phasicity and response to augmentation.  Popliteal Vein: No evidence of thrombus. Normal compressibility, respiratory phasicity and response to augmentation.  Calf Veins: No evidence of thrombus. Normal  compressibility and flow on color Doppler imaging.  Superficial Great Saphenous Vein: No evidence of thrombus. Normal compressibility and flow on color Doppler imaging.  Venous Reflux:  None.  Other Findings:  None.  LEFT LOWER EXTREMITY  Common Femoral Vein: No evidence of thrombus. Normal compressibility, respiratory phasicity and response to augmentation.  Saphenofemoral Junction: No evidence of thrombus. Normal compressibility and flow on color Doppler imaging.  Profunda Femoral Vein: No evidence of thrombus. Normal compressibility and flow on color Doppler imaging.  Femoral Vein: No evidence of thrombus. Normal compressibility, respiratory phasicity and response to augmentation.  Popliteal Vein: No evidence of thrombus. Normal compressibility, respiratory phasicity and response to augmentation.  Calf Veins: No evidence of thrombus. Normal compressibility and flow on color Doppler imaging.  Superficial Great Saphenous Vein: No evidence of thrombus. Normal compressibility and flow on color Doppler imaging.  Venous Reflux:  None.  Other Findings:  None.  IMPRESSION: No evidence of deep venous thrombosis in either lower extremity.   Electronically Signed   By: Bretta BangWilliam  Woodruff M.D.   On: 08/01/2014 15:51     MDM   1. Superficial varicosities, bilateral   2. Leg pain, bilateral;  No evidence DVT today.   3. Plantar fasciitis, bilateral   4. LEG CRAMPS   5. Edema    Check electrolytes with BMP Rx for TED hose. Trial of Skelaxin at bedtime for leg pain. Obtain "Tuli" heel cups.  Begin foot and leg exercises for plantar fasciitis.  Apply ice pack several times daily. Wear support hose daytime. Followup with Dr. Rodney Langtonhomas Thek[facility]kand[facility] ([facility] if not improving about two weeks.     Lattie HawStephen A Jamaira Sherk, MD 08/06/14 [phone]

## 2014-08-01 NOTE — Discharge Instructions (Signed)
Obtain "Tuli" heel cups.  Begin foot and leg exercises for plantar fasciitis.  Apply ice pack several times daily. Wear support hose daytime.   Compression Stockings Compression stockings are elastic stockings that "compress" your legs. This helps to increase blood flow, decrease swelling, and reduces the chance of getting blood clots in your lower legs. Compression stockings are used:  After surgery.  If you have a history of poor circulation.  If you are prone to blood clots.  If you have varicose veins.  If you sit or are bedridden for long periods of time. WEARING COMPRESSION STOCKINGS  Your compression stockings should be worn as instructed by your caregiver.  Wearing the correct stocking size is important. Your caregiver can help measure and fit you to the correct size.  When wearing your stockings, do not allow the stockings to bunch up. This is especially important around your toes or behind your knees. Keep the stockings as smooth as possible.  Do not roll the stockings downward and leave them rolled down. This can form a restrictive band around your legs and can decrease blood flow.  The stockings should be removed once a day for 1 hour or as instructed by your caregiver. When the stockings are taken off, inspect your legs and feet. Look for:  Open sores.  Red spots.  Puffy areas (swelling).  Anything that does not seem normal. IMPORTANT INFORMATION ABOUT COMPRESSION STOCKINGS  The compression stockings should be clean, dry, and in good condition before you put them on.  Do not put lotion on your legs or feet. This makes it harder to put the stockings on.  Change your stockings immediately if they become wet or soiled.  Do not wear stockings that are ripped or torn.  You may hand-wash or put your stockings in the washing machine. Use cold or warm water with mild detergent. Do not bleach your stockings. They may be air-dried or dried in the dryer on low  heat.  If you have pain or have a feeling of "pins and needles" in your feet or legs, you may be wearing stockings that are too tight. Call your caregiver right away. SEEK IMMEDIATE MEDICAL CARE IF:   You have numbness or tingling in your lower legs that does not get better quickly after the stockings are removed.  Your toes or feet become cold and blue.  You develop open sores or have red spots on your legs that do not go away. MAKE SURE YOU:   Understand these instructions.  Will watch your condition.  Will get help right away if you are not doing well or get worse. Document Released: 07/04/2009 Document Revised: 11/29/2011 Document Reviewed: 07/04/2009 [facility] Patient Information 2015 [place]Care, [place]. This information is not intended to replace advice given to you by your health care provider. Make sure you discuss any questions you have with your health care provider.   Plantar Fasciitis Plantar fasciitis is a common condition that causes foot pain. It is soreness (inflammation) of the band of tough fibrous tissue on the bottom of the foot that runs from the heel bone (calcaneus) to the ball of the foot. The cause of this soreness may be from excessive standing, poor fitting shoes, running on hard surfaces, being overweight, having an abnormal walk, or overuse (this is common in runners) of the painful foot or feet. It is also common in aerobic exercise dancers and ballet dancers. SYMPTOMS  Most people with plantar fasciitis complain of:  Severe pain in  the morning on the bottom of their foot especially when taking the first steps out of bed. This pain recedes after a few minutes of walking.  Severe pain is experienced also during walking following a long period of inactivity.  Pain is worse when walking barefoot or up stairs DIAGNOSIS   Your caregiver will diagnose this condition by examining and feeling your foot.  Special tests such as X-rays of your foot, are usually not  needed. PREVENTION   Consult a sports medicine professional before beginning a new exercise program.  Walking programs offer a good workout. With walking there is a lower chance of overuse injuries common to runners. There is less impact and less jarring of the joints.  Begin all new exercise programs slowly. If problems or pain develop, decrease the amount of time or distance until you are at a comfortable level.  Wear good shoes and replace them regularly.  Stretch your foot and the heel cords at the back of the ankle (Achilles tendon) both before and after exercise.  Run or exercise on even surfaces that are not hard. For example, asphalt is better than pavement.  Do not run barefoot on hard surfaces.  If using a treadmill, vary the incline.  Do not continue to workout if you have foot or joint problems. Seek professional help if they do not improve. HOME CARE INSTRUCTIONS   Avoid activities that cause you pain until you recover.  Use ice or cold packs on the problem or painful areas after working out.  Only take over-the-counter or prescription medicines for pain, discomfort, or fever as directed by your caregiver.  Soft shoe inserts or athletic shoes with air or gel sole cushions may be helpful.  If problems continue or become more severe, consult a sports medicine caregiver or your own health care provider. Cortisone is a potent anti-inflammatory medication that may be injected into the painful area. You can discuss this treatment with your caregiver. MAKE SURE YOU:   Understand these instructions.  Will watch your condition.  Will get help right away if you are not doing well or get worse. Document Released: 06/01/2001 Document Revised: 11/29/2011 Document Reviewed: 07/31/2008 [facility] Patient Information 2015 [place]ExitCare, [place]LLC. This information is not intended to replace advice given to you by your health care provider. Make sure you discuss any questions you have with  your health care provider.

## 2014-08-02 LAB — BASIC METABOLIC PANEL
BUN: 11 mg/dL (ref 6–23)
CO2: 30 mEq/L (ref 19–32)
Calcium: 9.7 mg/dL (ref 8.4–10.5)
Chloride: 102 mEq/L (ref 96–112)
Creat: 0.88 mg/dL (ref 0.50–1.10)
Glucose, Bld: 86 mg/dL (ref 70–99)
POTASSIUM: 4.6 meq/L (ref 3.5–5.3)
SODIUM: 142 meq/L (ref 135–145)

## 2014-08-09 ENCOUNTER — Ambulatory Visit (INDEPENDENT_AMBULATORY_CARE_PROVIDER_SITE_OTHER): Payer: Medicare Other

## 2014-08-09 ENCOUNTER — Ambulatory Visit (INDEPENDENT_AMBULATORY_CARE_PROVIDER_SITE_OTHER): Payer: Medicare Other | Admitting: Sports Medicine

## 2014-08-09 ENCOUNTER — Encounter: Payer: Self-pay | Admitting: Sports Medicine

## 2014-08-09 VITALS — BP 130/76 | HR 89 | Ht 60.0 in | Wt 168.0 lb

## 2014-08-09 DIAGNOSIS — M19071 Primary osteoarthritis, right ankle and foot: Secondary | ICD-10-CM

## 2014-08-09 DIAGNOSIS — M7731 Calcaneal spur, right foot: Secondary | ICD-10-CM

## 2014-08-09 DIAGNOSIS — M722 Plantar fascial fibromatosis: Secondary | ICD-10-CM

## 2014-08-09 DIAGNOSIS — M25572 Pain in left ankle and joints of left foot: Secondary | ICD-10-CM

## 2014-08-09 DIAGNOSIS — M19072 Primary osteoarthritis, left ankle and foot: Secondary | ICD-10-CM

## 2014-08-09 NOTE — Progress Notes (Signed)
Subjective:    I'm seeing this patient as a consultation for: Dr. Cathren HarshBeese, Dr. Linford ArnoldMetheney   CC: Bilateral foot pain  HPI: This is an extremely pleasant 73 year old female with a long history of pain at both ankle joints and on the plantar aspect of both calcaneus bones. Pain is moderate, persistent, not improved with oral anti-inflammatories. She has not been to our practice since mid 2012.   Past medical history, Surgical history, Family history not pertinant except as noted below, Social history, Allergies, and medications have been entered into the medical record, reviewed, and no changes needed.   Review of Systems: No headache, visual changes, nausea, vomiting, diarrhea, constipation, dizziness, abdominal pain, skin rash, fevers, chills, night sweats, weight loss, swollen lymph nodes, body aches, joint swelling, muscle aches, chest pain, shortness of breath, mood changes, visual or auditory hallucinations.   Objective:   General: Well Developed, well nourished, and in no acute distress.  Neuro/Psych: Alert and oriented x3, extra-ocular muscles intact, able to move all 4 extremities, sensation grossly intact. Skin: Warm and dry, no rashes noted.  Respiratory: Not using accessory muscles, speaking in full sentences, trachea midline.  Cardiovascular: Pulses palpable, no extremity edema. Abdomen: Does not appear distended. Bilateral feet: No visible erythema or swelling. Range of motion is full in all directions. Strength is 5/5 in all directions. No hallux valgus. Pes cavus No abnormal callus noted. No pain over the navicular prominence, or base of fifth metatarsal. Tender to palpation of the calcaneal insertion of plantar fascia. No pain at the Achilles insertion. No pain over the calcaneal bursa. No pain of the retrocalcaneal bursa. No tenderness to palpation over the tarsals, metatarsals, or phalanges. No hallux rigidus or limitus. No tenderness palpation over interphalangeal  joints. No pain with compression of the metatarsal heads. Neurovascularly intact distally. Bilateral Ankles: No visible erythema or swelling. Range of motion is full in all directions. Strength is 5/5 in all directions. Stable lateral and medial ligaments; squeeze test and kleiger test unremarkable; Talar dome nontender; tender to palpation at the lateral talocrural joint No pain at base of 5th MT; No tenderness over cuboid; No tenderness over N spot or navicular prominence No tenderness on posterior aspects of lateral and medial malleolus No sign of peroneal tendon subluxations; Negative tarsal tunnel tinel's Able to walk 4 steps.  Procedure: Real-time Ultrasound Guided Injection of left plantar fascia Device: GE Logiq E  Verbal informed consent obtained.  Time-out conducted.  Noted no overlying erythema, induration, or other signs of local infection.  Skin prepped in a sterile fashion.  Local anesthesia: Topical Ethyl chloride.  With sterile technique and under real time ultrasound guidance:  1 mL kenalog 40, 2 mL lidocaine injected easily at the calcaneal origin of the plantar fascia. Completed without difficulty  Pain immediately resolved suggesting accurate placement of the medication.  Advised to call if fevers/chills, erythema, induration, drainage, or persistent bleeding.  Images permanently stored and available for review in the ultrasound unit.  Impression: Technically successful ultrasound guided injection.  Procedure: Real-time Ultrasound Guided Injection of left talocrural joint Device: GE Logiq E  Verbal informed consent obtained.  Time-out conducted.  Noted no overlying erythema, induration, or other signs of local infection.  Skin prepped in a sterile fashion.  Local anesthesia: Topical Ethyl chloride.  With sterile technique and under real time ultrasound guidance:  1 mL kenalog 40, 2 mL lidocaine injected easily into the talocrural joint Completed without  difficulty  Pain immediately resolved suggesting accurate placement  of the medication.  Advised to call if fevers/chills, erythema, induration, drainage, or persistent bleeding.  Images permanently stored and available for review in the ultrasound unit.  Impression: Technically successful ultrasound guided injection.   Procedure: Real-time Ultrasound Guided Injection of right plantar fascia Device: GE Logiq E  Verbal informed consent obtained.  Time-out conducted.  Noted no overlying erythema, induration, or other signs of local infection.  Skin prepped in a sterile fashion.  Local anesthesia: Topical Ethyl chloride.  With sterile technique and under real time ultrasound guidance:  1 mL kenalog 40, 2 mL lidocaine injected easily at the calcaneal origin of the plantar fascia. Completed without difficulty  Pain immediately resolved suggesting accurate placement of the medication.  Advised to call if fevers/chills, erythema, induration, drainage, or persistent bleeding.  Images permanently stored and available for review in the ultrasound unit.  Impression: Technically successful ultrasound guided injection.  Procedure: Real-time Ultrasound Guided Injection of right talocrural joint Device: GE Logiq E  Verbal informed consent obtained.  Time-out conducted.  Noted no overlying erythema, induration, or other signs of local infection.  Skin prepped in a sterile fashion.  Local anesthesia: Topical Ethyl chloride.  With sterile technique and under real time ultrasound guidance:  1 mL kenalog 40, 2 mL lidocaine injected easily into the talocrural joint Completed without difficulty  Pain immediately resolved suggesting accurate placement of the medication.  Advised to call if fevers/chills, erythema, induration, drainage, or persistent bleeding.  Images permanently stored and available for review in the ultrasound unit.  Impression: Technically successful ultrasound guided  injection.   Impression and Recommendations:   This case required medical decision making of moderate complexity.

## 2014-08-09 NOTE — Assessment & Plan Note (Signed)
Talocrural joint injection on both sides. Ankle x-rays. Return to see me for custom orthotics.

## 2014-08-09 NOTE — Assessment & Plan Note (Signed)
Bilateral injection. Home rehabilitation exercises. Return for custom orthotics.

## 2014-08-13 ENCOUNTER — Encounter: Payer: Self-pay | Admitting: Family Medicine

## 2014-08-13 ENCOUNTER — Ambulatory Visit (INDEPENDENT_AMBULATORY_CARE_PROVIDER_SITE_OTHER): Payer: Medicare Other | Admitting: Family Medicine

## 2014-08-13 VITALS — BP 157/75 | HR 76 | Temp 98.7°F | Ht 60.0 in | Wt 168.0 lb

## 2014-08-13 DIAGNOSIS — R51 Headache: Secondary | ICD-10-CM

## 2014-08-13 DIAGNOSIS — I10 Essential (primary) hypertension: Secondary | ICD-10-CM

## 2014-08-13 DIAGNOSIS — E559 Vitamin D deficiency, unspecified: Secondary | ICD-10-CM

## 2014-08-13 DIAGNOSIS — R519 Headache, unspecified: Secondary | ICD-10-CM

## 2014-08-13 DIAGNOSIS — R251 Tremor, unspecified: Secondary | ICD-10-CM

## 2014-08-13 DIAGNOSIS — E039 Hypothyroidism, unspecified: Secondary | ICD-10-CM

## 2014-08-13 DIAGNOSIS — B354 Tinea corporis: Secondary | ICD-10-CM

## 2014-08-13 DIAGNOSIS — G252 Other specified forms of tremor: Secondary | ICD-10-CM

## 2014-08-13 DIAGNOSIS — G250 Essential tremor: Secondary | ICD-10-CM

## 2014-08-13 DIAGNOSIS — R296 Repeated falls: Secondary | ICD-10-CM

## 2014-08-13 DIAGNOSIS — Z23 Encounter for immunization: Secondary | ICD-10-CM

## 2014-08-13 DIAGNOSIS — F341 Dysthymic disorder: Secondary | ICD-10-CM

## 2014-08-13 MED ORDER — PROPRANOLOL HCL ER 120 MG PO CP24: 120.0000 mg | ORAL_CAPSULE | Freq: Every day | ORAL | Status: DC

## 2014-08-13 MED ORDER — NYSTATIN 100000 UNIT/GM EX POWD: CUTANEOUS | Status: DC

## 2014-08-13 MED ORDER — BUPROPION HCL ER (XL) 300 MG PO TB24: 300.0000 mg | ORAL_TABLET | Freq: Every day | ORAL | Status: DC

## 2014-08-13 NOTE — Assessment & Plan Note (Signed)
Well-controlled current regimen but she would like to add the Wellbutrin back. She was on 300 mg daily. New prescription sent to the pharmacy. She would also like to consider switching from Celexa back to fluoxetine. She feels like she has gained weight since being on the citalopram. There is an interaction between the citalopram and the atorvastatin which is most likely why she was switched to citalopram from fluoxetine. The she was very happy with the fluoxetine.

## 2014-08-13 NOTE — Assessment & Plan Note (Signed)
She is not currently taking medication for third thyroid. But this was on her old problem list. We will try to get up-to-date blood work from July to see if they checked her levels recently. She's also complained about some weight gain so I wonder if this could be contributing.

## 2014-08-13 NOTE — Patient Instructions (Signed)
Sent a new prescription for Wellbutrin to help with mood and energy during the daytime. Where going to work on setting up for scan of her head for your headaches that she been getting. Sent new prescription for antifungal powder to use under the breast area and in the groin creases. Please let us know if it's not improving over the next 2 weeks.

## 2014-08-13 NOTE — Assessment & Plan Note (Signed)
She does take a vitamin D supplement. I'm not sure if she is on replacement therapy or maintenance therapy at this point. Again we'll have to review this in her blood work that she had done from July.

## 2014-08-13 NOTE — Assessment & Plan Note (Signed)
Uncontrolled. Will increase her propranolol today. There consider that the brow could be causing some problems with energy level fatigue and headaches. If she feels worse on the increased dose then we may want to decrease it significantly and add something else. Follow-up in 4-6 weeks.

## 2014-08-13 NOTE — Progress Notes (Signed)
Subjective:    Patient ID: Nancy Wilson, female    DOB: 31-May-1941, 73 y.o.   MRN: [id number]  HPI here to reestablish care today. She was last seen in our office about 3 years ago.  Vitamin D deficiency-she does take a vitamin D supplement. Not sure when  Last checked.   Hyperlipidemia-she's currently taking atorvastatin.  Labs were in July and per patient were normal.   Anxiety/depression-she is no longer on sertraline she is currently on Celexa 40 mg. Takes it night and helps her sleep and with her mood. The only thing that she doesn't like about is that she feels it may have caused some weight gain.. She feels like she needs more energy during the daytime.  She takes care of her daughter with Jeral Pinch syndrome. Was on Welbutrin abut thought it was causing weight gain so stopped it but would like to restart it. It does help her energy level.   Hypertension-currently on propranolol. She notes that her blood pressure has actually been high for the last for 5 days. She has been checking it at home. She denies any recent changes in diet or salt intake etc.  Tremor-currently on propranolol.  BP has been elevatd for the last 4 days.  Denies any salt in her diet.  sleepin gwell.    Has been having daily headaches for about a year. Has been losing her balance more.  No snoring.   Rash under both breast. She says it has been there since the summer. She was given some prescription creams but they really don't seem to be helping. She did use baby powder for a little while and that did seem to help some.  Review of Systems  Constitutional: Positive for unexpected weight change.  Neurological: Positive for headaches.  Hematological: Bruises/bleeds easily.  Psychiatric/Behavioral: Positive for dysphoric mood. The patient is nervous/anxious.     BP 157/75 mmHg  Pulse 76  Temp(Src) 98.7 F (37.1 C)  Ht 5' (1.524 m)  Wt 168 lb (76.204 kg)  BMI 32.81 kg/m2  SpO2 97%    Allergies  Allergen  Reactions  . Codeine   . Doxycycline   . Guaifenesin   . Meperidine Hcl   . Metronidazole   . Morphine   . Penicillins     Past Medical History  Diagnosis Date  . Allergy   . Depression     long standing  . Hyperlipidemia   . Hypertension     Past Surgical History  Procedure Laterality Date  . Carpel tunnel release, both hands  1980's  . Trigger finger release, left finger    . Dilation and curettage of uterus    . Gastroscopy  11-06  . Tubal ligation      bilateral    History   Social History  . Marital Status: Married    Spouse Name: roger    Number of Children: N/A  . Years of Education: N/A   Occupational History  . Not on file.   Social History Main Topics  . Smoking status: Never Smoker   . Smokeless tobacco: Not on file  . Alcohol Use: No  . Drug Use: No  . Sexual Activity: Not on file   Other Topics Concern  . Not on file   Social History Narrative    Family History  Problem Relation Age of Onset  . Diabetes Mother   . Hyperlipidemia Mother   . Heart disease Mother 78  . Diabetes Father   .  Hyperlipidemia Father   . Tremor Father     hand- started in his 8050's  . Tremor Cousin     Outpatient Encounter Prescriptions as of 08/13/2014  Medication Sig  . atorvastatin (LIPITOR) 40 MG tablet Take 40 mg by mouth daily.  . cholecalciferol (VITAMIN D) 1000 UNITS tablet Take 2,000 Units by mouth daily.  . citalopram (CELEXA) 40 MG tablet Take 40 mg by mouth daily.  Marland Kitchen. nystatin (MYCOSTATIN/NYSTOP) 100000 UNIT/GM POWD Apply BID x 2-3 weeks.  . propranolol ER (INDERAL LA) 120 MG 24 hr capsule Take 1 capsule (120 mg total) by mouth daily.  . valACYclovir (VALTREX) 1000 MG tablet Take 1,000 mg by mouth 2 (two) times daily.  Marland Kitchen. buPROPion (WELLBUTRIN XL) 300 MG 24 hr tablet Take 1 tablet (300 mg total) by mouth daily.  . [DISCONTINUED] acyclovir (ZOVIRAX) 400 MG tablet TAKE 1 TABLET BY MOUTH TWO TIMES A DAY  . [DISCONTINUED] benzonatate (TESSALON) 100 MG  capsule Take 1 capsule (100 mg total) by mouth 3 (three) times daily as needed for cough.  . [DISCONTINUED] Calcium Carbonate-Vitamin D (CALTRATE 600+D) 600-400 MG-UNIT per tablet Take 1 tablet by mouth 2 (two) times daily.    . [DISCONTINUED] cetirizine (ZYRTEC) 10 MG tablet Take 10 mg by mouth 2 (two) times daily.    . [DISCONTINUED] cholestyramine (QUESTRAN) 4 G packet Take by mouth.    . [DISCONTINUED] FLUoxetine (PROZAC) 40 MG capsule Take 40 mg by mouth daily.    . [DISCONTINUED] ipratropium (ATROVENT) 0.06 % nasal spray Place 2 sprays into both nostrils 4 (four) times daily.  . [DISCONTINUED] meclizine (ANTIVERT) 25 MG tablet Take 25 mg by mouth 3 (three) times daily as needed. For dizziness   . [DISCONTINUED] metaxalone (SKELAXIN) 800 MG tablet Take one tab by mouth at bedtime as needed for leg cramps  . [DISCONTINUED] NON FORMULARY B-2 Extra high comp hose women (elastic bandages and support)   . [DISCONTINUED] promethazine (PHENERGAN) 25 MG tablet Take 25 mg by mouth 3 (three) times daily as needed. For nausea   . [DISCONTINUED] propranolol (INDERAL LA) 80 MG 24 hr capsule Take 80 mg by mouth daily.    . [DISCONTINUED] risedronate (ACTONEL) 35 MG tablet Take 35 mg by mouth every 7 (seven) days. with water on empty stomach, nothing by mouth or lie down for next 30 minutes.   . [DISCONTINUED] risperiDONE (RISPERDAL) 1 MG tablet Take 1 mg by mouth at bedtime.    . [DISCONTINUED] sertraline (ZOLOFT) 100 MG tablet TAKE 1 TABLET EVERY DAY  . [DISCONTINUED] simvastatin (ZOCOR) 20 MG tablet Take 20 mg by mouth at bedtime.    . [DISCONTINUED] SUMAtriptan (IMITREX) 50 MG tablet Take 50 mg by mouth every 2 (two) hours as needed for migraine or headache. May repeat in 2 hours if headache persists or recurs.  . [DISCONTINUED] topiramate (TOPAMAX) 100 MG tablet TAKE 1 TABLET AT BEDTIME          Objective:   Physical Exam  Constitutional: She is oriented to person, place, and time. She appears  well-developed and well-nourished.  HENT:  Head: Normocephalic and atraumatic.  Eyes: Conjunctivae and EOM are normal. Pupils are equal, round, and reactive to light.  Neck: Neck supple. No thyromegaly present.  Cardiovascular: Normal rate, regular rhythm and normal heart sounds.   Pulmonary/Chest: Effort normal and breath sounds normal.  Lymphadenopathy:    She has no cervical adenopathy.  Neurological: She is alert and oriented to person, place, and time. No cranial nerve  deficit.  Skin: Skin is warm and dry. Rash noted.  She does have a pink confluent rash under both breasts. No open wounds or drainage.  Psychiatric: She has a normal mood and affect. Her behavior is normal.          Assessment & Plan:   Chronic daily headaches for one year. Unclear etiology. She says they actually started before she was switched to citalopram and no other medications or new. She's never had this problem before. She does have a history of migraines but says these are completely different type of headaches. She is not sure weight what may be triggering them. She has been falling a little bit more frequently has been off balance. I would like to move forward with an MRI of the brain. 3 unusual to develop chronic headaches and a 73 year old especially if unlikely to be a medication side effect. Still consider propranolol could be a factor since it does cross the blood brain barrier. Though she has been on it for years.  Tinea under breast - will treat with nystatin powder. If not significantly improved in 2-3 weeks and please let me know.  Given flu vaccine today.

## 2014-08-13 NOTE — Assessment & Plan Note (Signed)
Well controlled on propranolol. 

## 2014-08-20 ENCOUNTER — Telehealth: Payer: Self-pay | Admitting: *Deleted

## 2014-08-20 NOTE — Telephone Encounter (Signed)
MRI approval [id number] expires 10/04/14. Radiology notified. Corliss SkainsJamie Loella Hickle, CMA

## 2014-08-24 ENCOUNTER — Ambulatory Visit (HOSPITAL_BASED_OUTPATIENT_CLINIC_OR_DEPARTMENT_OTHER)
Admission: RE | Admit: 2014-08-24 | Discharge: 2014-08-24 | Disposition: A | Discharge status: Home / Self Care | Payer: Medicare Other | Source: Ambulatory Visit | Attending: Family Medicine | Admitting: Family Medicine

## 2014-08-24 DIAGNOSIS — R519 Headache, unspecified: Secondary | ICD-10-CM

## 2014-08-24 DIAGNOSIS — R296 Repeated falls: Secondary | ICD-10-CM | POA: Diagnosis not present

## 2014-08-24 DIAGNOSIS — R2689 Other abnormalities of gait and mobility: Secondary | ICD-10-CM | POA: Insufficient documentation

## 2014-08-24 DIAGNOSIS — R251 Tremor, unspecified: Secondary | ICD-10-CM | POA: Diagnosis not present

## 2014-08-24 DIAGNOSIS — R51 Headache: Secondary | ICD-10-CM | POA: Diagnosis not present

## 2014-08-24 MED ORDER — GADOBENATE DIMEGLUMINE 529 MG/ML IV SOLN: 15.0000 mL | Freq: Once | INTRAVENOUS | Status: AC | PRN

## 2014-08-29 ENCOUNTER — Other Ambulatory Visit: Payer: Self-pay | Admitting: Family Medicine

## 2014-08-29 DIAGNOSIS — R9389 Abnormal findings on diagnostic imaging of other specified body structures: Secondary | ICD-10-CM

## 2014-09-04 ENCOUNTER — Telehealth: Payer: Self-pay | Admitting: *Deleted

## 2014-09-04 NOTE — Telephone Encounter (Signed)
CT approval [id number] valid for 45 calendar days. Radiology notified.

## 2014-09-09 ENCOUNTER — Other Ambulatory Visit: Payer: Medicare Other

## 2014-09-10 ENCOUNTER — Ambulatory Visit (INDEPENDENT_AMBULATORY_CARE_PROVIDER_SITE_OTHER): Payer: Medicare Other

## 2014-09-10 DIAGNOSIS — R938 Abnormal findings on diagnostic imaging of other specified body structures: Secondary | ICD-10-CM

## 2014-09-10 DIAGNOSIS — R9389 Abnormal findings on diagnostic imaging of other specified body structures: Secondary | ICD-10-CM

## 2014-09-12 ENCOUNTER — Emergency Department
Admission: EM | Admit: 2014-09-12 | Discharge: 2014-09-12 | Disposition: A | Discharge status: Home / Self Care | Payer: Medicare Other | Source: Home / Self Care | Attending: Emergency Medicine | Admitting: Emergency Medicine

## 2014-09-12 DIAGNOSIS — J029 Acute pharyngitis, unspecified: Secondary | ICD-10-CM

## 2014-09-12 DIAGNOSIS — J0100 Acute maxillary sinusitis, unspecified: Secondary | ICD-10-CM

## 2014-09-12 MED ORDER — AZITHROMYCIN 250 MG PO TABS: ORAL_TABLET | ORAL | Status: DC

## 2014-09-12 MED ORDER — FLUTICASONE PROPIONATE 50 MCG/ACT NA SUSP: NASAL | Status: DC

## 2014-09-12 NOTE — ED Notes (Signed)
C/o facial pain and pressure, sinus drainage, sore throat, red-watery eyes x 1 day

## 2014-09-13 NOTE — ED Provider Notes (Signed)
CSN: [id number]     Arrival date & time 09/12/14  1052 History   First MD Initiated Contact with Patient 09/12/14 1058     Chief Complaint  Patient presents with  . Facial Pain   (Consider location/radiation/quality/duration/timing/severity/associated sxs/prior Treatment) HPI SINUSITIS  Onset: 3-4 days Facial/sinus pressure with discolored nasal mucus.    Severity: moderate Tried OTC meds without significant relief.  Symptoms:  + Fever  + URI prodrome with nasal congestion + Minimal swollen neck glands + mild Sinus Headache + mild ear pressure  No Allergy symptoms No significant Sore Throat No eye symptoms     No significant Cough No chest pain No shortness of breath  No wheezing  No Abdominal Pain No Nausea No Vomiting No diarrhea  No Myalgias No focal neurologic symptoms No syncope No Rash  No Urinary symptoms          Past Medical History  Diagnosis Date  . Allergy   . Depression     long standing  . Hyperlipidemia   . Hypertension    Past Surgical History  Procedure Laterality Date  . Carpel tunnel release, both hands  1980's  . Trigger finger release, left finger    . Dilation and curettage of uterus    . Gastroscopy  11-06  . Tubal ligation      bilateral   Family History  Problem Relation Age of Onset  . Diabetes Mother   . Hyperlipidemia Mother   . Heart disease Mother 3521  . Diabetes Father   . Hyperlipidemia Father   . Tremor Father     hand- started in his 2650's  . Tremor Cousin   . Hypertension Mother   . Hypertension Father    History  Substance Use Topics  . Smoking status: Never Smoker   . Smokeless tobacco: Not on file  . Alcohol Use: No   OB History    No data available     Review of Systems  All other systems reviewed and are negative.   Allergies  Codeine; Doxycycline; Guaifenesin; Meperidine hcl; Metronidazole; Morphine; and Penicillins  Home Medications   Prior to Admission medications    Medication Sig Start Date End Date Taking? Authorizing Provider  atorvastatin (LIPITOR) 40 MG tablet Take 40 mg by mouth daily.   Yes Historical Provider, MD  buPROPion (WELLBUTRIN XL) 300 MG 24 hr tablet Take 1 tablet (300 mg total) by mouth daily. 08/13/14  Yes Agapito Gamesatherine D Metheney, MD  cholecalciferol (VITAMIN D) 1000 UNITS tablet Take 2,000 Units by mouth daily.   Yes Historical Provider, MD  citalopram (CELEXA) 40 MG tablet Take 40 mg by mouth daily. 06/09/14  Yes Historical Provider, MD  nystatin (MYCOSTATIN/NYSTOP) 100000 UNIT/GM POWD Apply BID x 2-3 weeks. 08/13/14  Yes Agapito Gamesatherine D Metheney, MD  propranolol ER (INDERAL LA) 120 MG 24 hr capsule Take 1 capsule (120 mg total) by mouth daily. 08/13/14  Yes Agapito Gamesatherine D Metheney, MD  valACYclovir (VALTREX) 1000 MG tablet Take 1,000 mg by mouth 2 (two) times daily.   Yes Historical Provider, MD  azithromycin (ZITHROMAX Z-PAK) 250 MG tablet Take 2 tablets on day one, then 1 tablet daily on days 2 through 5 09/12/14   Lajean Manesavid Massey, MD  fluticasone [facility](FLONASE) 50 MCG/ACT nasal spray 1 or 2 sprays each nostril twice a day 09/12/14   Lajean Manesavid Massey, MD   BP 170/84 mmHg  Pulse 88  Temp(Src) 98 F (36.7 C) (Oral)  Wt 160 lb (72.576 kg)  SpO2 98%  Physical Exam  Constitutional: She is oriented to person, place, and time. She appears well-developed and well-nourished. No distress.  HENT:  Head: Normocephalic and atraumatic.  Right Ear: Tympanic membrane, external ear and ear canal normal.  Left Ear: Tympanic membrane, external ear and ear canal normal.  Nose: Mucosal edema and rhinorrhea present. Right sinus exhibits maxillary sinus tenderness. Left sinus exhibits maxillary sinus tenderness.  Mouth/Throat: Oropharynx is clear and moist. No oral lesions. No oropharyngeal exudate.  Posterior pharynx, mildly red  Eyes: Right eye exhibits no discharge. Left eye exhibits no discharge. No scleral icterus.  Neck: Neck supple.  Cardiovascular: Normal rate,  regular rhythm and normal heart sounds.   Pulmonary/Chest: Effort normal and breath sounds normal. She has no wheezes. She has no rales.  Lymphadenopathy:    She has no cervical adenopathy.  Neurological: She is alert and oriented to person, place, and time.  Skin: Skin is warm and dry.  Nursing note and vitals reviewed.   ED Course  Procedures (including critical care time) Labs Review   MDM   1. Acute maxillary sinusitis, recurrence not specified   2. Acute pharyngitis, unspecified pharyngitis type    Zpak flonase  Follow-up with your primary care doctor in 5-7 days if not improving, or sooner if symptoms become worse. Precautions discussed. Red flags discussed. Questions invited and answered. Patient voiced understanding and agreement.     Lajean Manesavid Massey, MD 09/13/14 1341

## 2014-09-17 ENCOUNTER — Other Ambulatory Visit: Payer: Self-pay | Admitting: *Deleted

## 2014-09-17 DIAGNOSIS — N183 Chronic kidney disease, stage 3 unspecified: Secondary | ICD-10-CM

## 2014-09-17 DIAGNOSIS — R51 Headache: Principal | ICD-10-CM

## 2014-09-17 DIAGNOSIS — R519 Headache, unspecified: Secondary | ICD-10-CM

## 2014-09-18 ENCOUNTER — Ambulatory Visit (INDEPENDENT_AMBULATORY_CARE_PROVIDER_SITE_OTHER): Payer: Medicare Other | Admitting: Sports Medicine

## 2014-09-18 ENCOUNTER — Encounter: Payer: Self-pay | Admitting: Sports Medicine

## 2014-09-18 VITALS — BP 148/85 | HR 93 | Ht 60.0 in | Wt 159.0 lb

## 2014-09-18 DIAGNOSIS — M19072 Primary osteoarthritis, left ankle and foot: Secondary | ICD-10-CM

## 2014-09-18 DIAGNOSIS — M19071 Primary osteoarthritis, right ankle and foot: Secondary | ICD-10-CM

## 2014-09-18 DIAGNOSIS — M722 Plantar fascial fibromatosis: Secondary | ICD-10-CM

## 2014-09-18 LAB — CBC WITH DIFFERENTIAL/PLATELET
BASOS PCT: 0 % (ref 0–1)
Basophils Absolute: 0 10*3/uL (ref 0.0–0.1)
EOS ABS: 0.1 10*3/uL (ref 0.0–0.7)
EOS PCT: 2 % (ref 0–5)
HEMATOCRIT: 43.7 % (ref 36.0–46.0)
Hemoglobin: 14.2 g/dL (ref 12.0–15.0)
Lymphocytes Relative: 40 % (ref 12–46)
Lymphs Abs: 2.1 10*3/uL (ref 0.7–4.0)
MCH: 31.6 pg (ref 26.0–34.0)
MCHC: 32.5 g/dL (ref 30.0–36.0)
MCV: 97.3 fL (ref 78.0–100.0)
MPV: 9 fL (ref 8.6–12.4)
Monocytes Absolute: 0.6 10*3/uL (ref 0.1–1.0)
Monocytes Relative: 11 % (ref 3–12)
NEUTROS ABS: 2.5 10*3/uL (ref 1.7–7.7)
Neutrophils Relative %: 47 % (ref 43–77)
PLATELETS: 282 10*3/uL (ref 150–400)
RBC: 4.49 MIL/uL (ref 3.87–5.11)
RDW: 13.9 % (ref 11.5–15.5)
WBC: 5.3 10*3/uL (ref 4.0–10.5)

## 2014-09-18 LAB — COMPLETE METABOLIC PANEL WITH GFR
ALBUMIN: 4.1 g/dL (ref 3.5–5.2)
ALT: 30 U/L (ref 0–35)
AST: 25 U/L (ref 0–37)
Alkaline Phosphatase: 73 U/L (ref 39–117)
BUN: 18 mg/dL (ref 6–23)
CHLORIDE: 106 meq/L (ref 96–112)
CO2: 25 mEq/L (ref 19–32)
CREATININE: 1.17 mg/dL — AB (ref 0.50–1.10)
Calcium: 9.5 mg/dL (ref 8.4–10.5)
GFR, EST NON AFRICAN AMERICAN: 47 mL/min — AB
GFR, Est African American: 54 mL/min — ABNORMAL LOW
GLUCOSE: 94 mg/dL (ref 70–99)
POTASSIUM: 4.5 meq/L (ref 3.5–5.3)
SODIUM: 139 meq/L (ref 135–145)
Total Bilirubin: 0.6 mg/dL (ref 0.2–1.2)
Total Protein: 6.5 g/dL (ref 6.0–8.3)

## 2014-09-18 NOTE — Assessment & Plan Note (Signed)
Continues to be resolved. Custom orthotics as above.  Return in one month.

## 2014-09-18 NOTE — Progress Notes (Signed)

## 2014-09-18 NOTE — Assessment & Plan Note (Signed)
Custom orthotics as above. Doing well after injection.

## 2014-09-19 LAB — PATHOLOGIST SMEAR REVIEW

## 2014-09-24 ENCOUNTER — Ambulatory Visit (INDEPENDENT_AMBULATORY_CARE_PROVIDER_SITE_OTHER): Payer: Commercial Managed Care - HMO | Admitting: Family Medicine

## 2014-09-24 ENCOUNTER — Encounter: Payer: Self-pay | Admitting: Family Medicine

## 2014-09-24 VITALS — BP 138/82 | HR 85 | Ht 60.0 in | Wt 160.0 lb

## 2014-09-24 DIAGNOSIS — R05 Cough: Secondary | ICD-10-CM

## 2014-09-24 DIAGNOSIS — I8390 Asymptomatic varicose veins of unspecified lower extremity: Secondary | ICD-10-CM

## 2014-09-24 DIAGNOSIS — N183 Chronic kidney disease, stage 3 unspecified: Secondary | ICD-10-CM

## 2014-09-24 DIAGNOSIS — I10 Essential (primary) hypertension: Secondary | ICD-10-CM

## 2014-09-24 DIAGNOSIS — R059 Cough, unspecified: Secondary | ICD-10-CM

## 2014-09-24 DIAGNOSIS — R9389 Abnormal findings on diagnostic imaging of other specified body structures: Secondary | ICD-10-CM

## 2014-09-24 DIAGNOSIS — I868 Varicose veins of other specified sites: Secondary | ICD-10-CM

## 2014-09-24 DIAGNOSIS — R938 Abnormal findings on diagnostic imaging of other specified body structures: Secondary | ICD-10-CM

## 2014-09-24 MED ORDER — FLUOXETINE HCL 20 MG PO TABS: 20.0000 mg | ORAL_TABLET | Freq: Every day | ORAL | Status: DC

## 2014-09-24 MED ORDER — PROPRANOLOL HCL ER 80 MG PO CP24: 80.0000 mg | ORAL_CAPSULE | Freq: Every day | ORAL | Status: DC

## 2014-09-24 MED ORDER — AMBULATORY NON FORMULARY MEDICATION: Status: DC

## 2014-09-24 MED ORDER — BENZONATATE 200 MG PO CAPS: 200.0000 mg | ORAL_CAPSULE | Freq: Three times a day (TID) | ORAL | Status: DC | PRN

## 2014-09-24 NOTE — Progress Notes (Signed)
   Subjective:    Patient ID: Nancy Wilson, female    DOB: 23-May-1941, 74 y.o.   MRN: [id number]  HPI F/U mood. She is off the citalopram and her headaches are not any better.  She is just on Wellbutrin. She wants to go back on th fluoxetine.   Chronic headaches-MRI only showed some typical vascular change. No brain lesions etc. she does occasionally get her headaches originating from the occiput near her neck. Though occasionally they're just frontal in nature. MRI did should did show an abnormality of the C3 vertebra  Needs compression stocking. Hx fo DVT and varicosities. Would like a rx.    She had bronchitis about 2 weeks ago. She got bette but stil has a cough at night . More dry and ticking.  No fevers, chills or sweats. She's she says she is not taking any cough or cold medications. Her husband has had a cough as well.  Follow-up blood pressure-When I last saw her we did increase her propranolol to help with her tremor and her blood pressure. Unfortunately she says it made her feel worse and almost jerky. Review of Systems     Objective:   Physical Exam  Constitutional: She is oriented to person, place, and time. She appears well-developed and well-nourished.  HENT:  Head: Normocephalic and atraumatic.  Cardiovascular: Normal rate, regular rhythm and normal heart sounds.   Pulmonary/Chest: Effort normal and breath sounds normal.  Neurological: She is alert and oriented to person, place, and time.  Skin: Skin is warm and dry.  Fairly large workup is main of the lower extremities is bilateral including the lower legs and thighs.  Psychiatric: She has a normal mood and affect. Her behavior is normal.          Assessment & Plan:  Cough- likely post infection. Lungs are clear today. Gave reassurance. If she's not improving over the next 2 days and please let me know. Will call in tessalon perles. She would like ot have something for cough.   Varicosities of lower chin  these-she would like upper scription for compression stockings. Prescription provided for moderate grade, 20 mmHg compression stockings. She prefers to have them below the knee.  Hypertension-repeat blood pressure was a little bit better today. We'll into you to monitor. She did not tolerate the higher dose of propranolol so we would need to add another medication for better control of her blood pressure stays elevated.  Abnormal C3 vertebrae-it looks like there some type of process that has taken over the bone marrow. I would like to refer her to orthopedist to have a biopsy performed for further evaluation. i'M a  little leery about waiting 3 months for repeat scan.

## 2014-09-26 ENCOUNTER — Telehealth: Payer: Self-pay

## 2014-09-26 NOTE — Telephone Encounter (Signed)
Patient called wanting tessalon cough medication. I advised patient this was already sent in.

## 2014-10-03 ENCOUNTER — Telehealth: Payer: Self-pay

## 2014-10-03 NOTE — Telephone Encounter (Signed)
Nancy Wilson called and states she would like to see if we could switch her referral to Pill Micah NoelStephan G MD Orthopedic Surgeon Address: [address], [place], [place] [address] [phone].

## 2014-10-16 ENCOUNTER — Ambulatory Visit: Payer: Medicare Other | Admitting: Sports Medicine

## 2014-10-31 ENCOUNTER — Telehealth: Payer: Self-pay | Admitting: Emergency Medicine

## 2014-10-31 NOTE — Telephone Encounter (Signed)
Patient calls to report some BP elevations over past 36 hours: 136/98; 155/105; 144/91. Wants to know if you want to change her antihypertensive medications and/or doses? Called her and she is having a headache, taking her imitrex. I went over red flags and told her Dr.Metheney would review this and be in touch tomorrow, 11-01-14; in meantime she should go to Urgent Care or ER if concerns escalate.

## 2014-10-31 NOTE — Telephone Encounter (Signed)
BP was perfect a month ago.  SO if only high for 3 days lets monitor throught the weekend. Pain, lack of sleep and salt intake, and use of Advile or Aleve, bar and stress can all contribute to elevatee BP.  If still running higher by Monday then we can make a change.

## 2014-11-01 DIAGNOSIS — R0981 Nasal congestion: Secondary | ICD-10-CM | POA: Diagnosis not present

## 2014-11-01 DIAGNOSIS — R0602 Shortness of breath: Secondary | ICD-10-CM | POA: Diagnosis not present

## 2014-11-01 DIAGNOSIS — F329 Major depressive disorder, single episode, unspecified: Secondary | ICD-10-CM | POA: Diagnosis not present

## 2014-11-01 DIAGNOSIS — I63522 Cerebral infarction due to unspecified occlusion or stenosis of left anterior cerebral artery: Secondary | ICD-10-CM | POA: Diagnosis not present

## 2014-11-01 DIAGNOSIS — Z881 Allergy status to other antibiotic agents status: Secondary | ICD-10-CM | POA: Diagnosis not present

## 2014-11-01 DIAGNOSIS — R51 Headache: Secondary | ICD-10-CM | POA: Diagnosis not present

## 2014-11-01 DIAGNOSIS — E785 Hyperlipidemia, unspecified: Secondary | ICD-10-CM | POA: Diagnosis not present

## 2014-11-01 DIAGNOSIS — I10 Essential (primary) hypertension: Secondary | ICD-10-CM | POA: Diagnosis not present

## 2014-11-01 DIAGNOSIS — F419 Anxiety disorder, unspecified: Secondary | ICD-10-CM | POA: Diagnosis not present

## 2014-11-01 DIAGNOSIS — Z86718 Personal history of other venous thrombosis and embolism: Secondary | ICD-10-CM | POA: Diagnosis not present

## 2014-11-01 DIAGNOSIS — Z885 Allergy status to narcotic agent status: Secondary | ICD-10-CM | POA: Diagnosis not present

## 2014-11-01 NOTE — Telephone Encounter (Signed)
lvm informing pt of Dr. Shelah LewandowskyMetheney's recommendations. Advised that she rtn call to either myself or Marylene Landngela for f/u.Loralee PacasBarkley, Jakirah Zaun [place]

## 2014-11-14 DIAGNOSIS — Z0000 Encounter for general adult medical examination without abnormal findings: Secondary | ICD-10-CM | POA: Diagnosis not present

## 2014-11-14 DIAGNOSIS — M542 Cervicalgia: Secondary | ICD-10-CM | POA: Diagnosis not present

## 2014-11-18 ENCOUNTER — Telehealth: Payer: Self-pay | Admitting: *Deleted

## 2014-11-18 DIAGNOSIS — R9389 Abnormal findings on diagnostic imaging of other specified body structures: Secondary | ICD-10-CM

## 2014-11-18 NOTE — Telephone Encounter (Signed)
Called and informed pt that Dr. Linford ArnoldMetheney feels that it would be ok for her to move forward with the ortho's recommendations. She stated that he never checked her neck and would like to get a second opi

## 2014-11-26 ENCOUNTER — Ambulatory Visit (INDEPENDENT_AMBULATORY_CARE_PROVIDER_SITE_OTHER): Payer: Commercial Managed Care - HMO | Admitting: Family Medicine

## 2014-11-26 ENCOUNTER — Encounter: Payer: Self-pay | Admitting: Family Medicine

## 2014-11-26 ENCOUNTER — Telehealth: Payer: Self-pay | Admitting: Family Medicine

## 2014-11-26 VITALS — BP 149/79 | HR 110 | Ht 60.0 in | Wt 154.0 lb

## 2014-11-26 DIAGNOSIS — I10 Essential (primary) hypertension: Secondary | ICD-10-CM | POA: Diagnosis not present

## 2014-11-26 DIAGNOSIS — G43009 Migraine without aura, not intractable, without status migrainosus: Secondary | ICD-10-CM | POA: Diagnosis not present

## 2014-11-26 DIAGNOSIS — N183 Chronic kidney disease, stage 3 (moderate): Secondary | ICD-10-CM | POA: Diagnosis not present

## 2014-11-26 DIAGNOSIS — L570 Actinic keratosis: Secondary | ICD-10-CM

## 2014-11-26 DIAGNOSIS — Z79899 Other long term (current) drug therapy: Secondary | ICD-10-CM | POA: Diagnosis not present

## 2014-11-26 MED ORDER — AMLODIPINE BESYLATE 5 MG PO TABS: 5.0000 mg | ORAL_TABLET | Freq: Every day | ORAL | Status: DC

## 2014-11-26 MED ORDER — OMEPRAZOLE 40 MG PO CPDR: 40.0000 mg | DELAYED_RELEASE_CAPSULE | Freq: Every day | ORAL | Status: DC

## 2014-11-26 NOTE — Progress Notes (Signed)
   Subjective:    Patient ID: Nancy Wilson, female    DOB: [birth year], 74 y.o.   MRN: [id number]  HPI Hypertension- Pt denies chest pain, SOB, dizziness, or heart palpitations.  Taking meds as directed w/o problems.  Denies medication side effects.  Home BPs running 150s at home since went to the ED about 2 weeks ago for high BP.   Migraine - her insurance won't pay for the Imitrex. She will need something else instead.  Follow-up abnormal C2 on MRI/CT scan. I have referred her to the orthopedist for further evaluation. He evidently felt like the changes were benign and not worrisome. Just has some achiness in her neck. The report had recommended a repeat MRI in 3 months which would be in about 2 weeks.    Review of Systems     Objective:   Physical Exam  Constitutional: She is oriented to person, place, and time. She appears well-developed and well-nourished.  HENT:  Head: Normocephalic and atraumatic.  Cardiovascular: Normal rate, regular rhythm and normal heart sounds.   Pulmonary/Chest: Effort normal and breath sounds normal.  Neurological: She is alert and oriented to person, place, and time.  Skin: Skin is warm and dry.  Psychiatric: She has a normal mood and affect. Her behavior is normal.          Assessment & Plan:  HTN - uncontrolled. Will add amlodipine 5mg  Qd ot her regimen. F/U in 1 month to recheck . We'll monitor for lightheadedness dizziness or lower Schimke swelling.  Migraine - will switch to a butalbital/Tylenol compound. They're likely not covering Imitrex based on her age.  Abnormal C2 on MRI and CT-we'll try to get orthopedist notes to see what they recommended.

## 2014-11-26 NOTE — Telephone Encounter (Signed)
Patient called advised she was just seen today and wants to know if she can have a referral to see a dermatologist in [place] off [address]. I adv pt that I will send a phone note. Thanks

## 2014-11-27 LAB — BASIC METABOLIC PANEL
BUN: 17 mg/dL (ref 6–23)
CALCIUM: 9.4 mg/dL (ref 8.4–10.5)
CHLORIDE: 106 meq/L (ref 96–112)
CO2: 20 meq/L (ref 19–32)
Creat: 1.1 mg/dL (ref 0.50–1.10)
Glucose, Bld: 90 mg/dL (ref 70–99)
POTASSIUM: 4.7 meq/L (ref 3.5–5.3)
Sodium: 141 mEq/L (ref 135–145)

## 2014-11-27 LAB — TSH: TSH: 1.727 u[IU]/mL (ref 0.350–4.500)

## 2014-11-27 MED ORDER — BUTALBITAL-APAP-CAFFEINE 50-325-40 MG PO CAPS: 1.0000 | ORAL_CAPSULE | ORAL | Status: DC | PRN

## 2014-11-27 NOTE — Progress Notes (Signed)
Quick Note:  All labs are normal. ______ 

## 2014-11-28 ENCOUNTER — Other Ambulatory Visit: Payer: Self-pay | Admitting: Family Medicine

## 2014-11-28 ENCOUNTER — Other Ambulatory Visit: Payer: Self-pay

## 2014-11-28 MED ORDER — ATORVASTATIN CALCIUM 40 MG PO TABS: 40.0000 mg | ORAL_TABLET | Freq: Every day | ORAL | Status: DC

## 2014-11-28 MED ORDER — PROPRANOLOL HCL ER 80 MG PO CP24: 80.0000 mg | ORAL_CAPSULE | Freq: Every day | ORAL | Status: DC

## 2014-12-17 ENCOUNTER — Telehealth: Payer: Self-pay | Admitting: *Deleted

## 2014-12-17 ENCOUNTER — Encounter: Payer: Self-pay | Admitting: Family Medicine

## 2014-12-17 ENCOUNTER — Ambulatory Visit (INDEPENDENT_AMBULATORY_CARE_PROVIDER_SITE_OTHER): Payer: Commercial Managed Care - HMO | Admitting: Family Medicine

## 2014-12-17 VITALS — BP 130/82 | HR 79 | Ht 60.0 in | Wt 153.0 lb

## 2014-12-17 DIAGNOSIS — M8580 Other specified disorders of bone density and structure, unspecified site: Secondary | ICD-10-CM

## 2014-12-17 DIAGNOSIS — Z1231 Encounter for screening mammogram for malignant neoplasm of breast: Secondary | ICD-10-CM | POA: Diagnosis not present

## 2014-12-17 DIAGNOSIS — E039 Hypothyroidism, unspecified: Secondary | ICD-10-CM

## 2014-12-17 DIAGNOSIS — I10 Essential (primary) hypertension: Secondary | ICD-10-CM

## 2014-12-17 MED ORDER — PROPRANOLOL HCL ER 80 MG PO CP24: 80.0000 mg | ORAL_CAPSULE | Freq: Every day | ORAL | Status: DC

## 2014-12-17 MED ORDER — OMEPRAZOLE 40 MG PO CPDR: 40.0000 mg | DELAYED_RELEASE_CAPSULE | Freq: Every day | ORAL | Status: DC

## 2014-12-17 MED ORDER — BUPROPION HCL ER (XL) 300 MG PO TB24: 300.0000 mg | ORAL_TABLET | Freq: Every day | ORAL | Status: DC

## 2014-12-17 MED ORDER — AMLODIPINE BESYLATE 5 MG PO TABS: 5.0000 mg | ORAL_TABLET | Freq: Every day | ORAL | Status: DC

## 2014-12-17 MED ORDER — SUMATRIPTAN SUCCINATE 100 MG PO TABS: 100.0000 mg | ORAL_TABLET | ORAL | Status: DC | PRN

## 2014-12-17 NOTE — Telephone Encounter (Signed)
OK, new script sent to mail-order

## 2014-12-17 NOTE — Telephone Encounter (Signed)
Pt called & wanted you to know that she contacted her ins company and they informed her that they will pay for the imitrex.

## 2014-12-17 NOTE — Progress Notes (Signed)
   Subjective:    Patient ID: Nancy Wilson, female    DOB: May 26, 1941, 74 y.o.   MRN: [id number]  HPI Hypertension- She started the amlodpine about 4 weeks ago. She has been tolerateing it well. No LE swelling. Pt denies chest pain, SOB, dizziness, or heart palpitations.  Taking meds as directed w/o problems.  Denies medication side effects.  Home BPs running in the 120-130s and mostly 80s on the bottom.    Review of Systems     Objective:   Physical Exam  Constitutional: She is oriented to person, place, and time. She appears well-developed and well-nourished.  HENT:  Head: Normocephalic and atraumatic.  Cardiovascular: Normal rate, regular rhythm and normal heart sounds.   Pulmonary/Chest: Effort normal and breath sounds normal.  Neurological: She is alert and oriented to person, place, and time.  Skin: Skin is warm and dry.  Psychiatric: She has a normal mood and affect. Her behavior is normal.          Assessment & Plan:  HTN - well controlled at home.  Looks great on repeat here.  Continue current regimen. F/U in 4 months.    Osteopenia-due to repeat bone density. Also due for screening mammogram. Both were ordered today.  Hypothyroidism-TSH checked 3 weeks ago looked fantastic.

## 2014-12-19 ENCOUNTER — Telehealth: Payer: Self-pay | Admitting: Family Medicine

## 2014-12-19 NOTE — Telephone Encounter (Signed)
Yes, that is correct 

## 2014-12-19 NOTE — Telephone Encounter (Signed)
Received directions verification for Sumatriptan 100mg  tablet Rx. Spoke with Mindi JunkerMarsha, Pharmacist, and verbalized the following directions: Directions verification: 1 tablet as needed at onset of migraine, may repeat in 2 hours if migraine persist. Maximum of 2 tabs daily.   No further questions.

## 2014-12-25 ENCOUNTER — Telehealth: Payer: Self-pay | Admitting: *Deleted

## 2014-12-25 ENCOUNTER — Ambulatory Visit (INDEPENDENT_AMBULATORY_CARE_PROVIDER_SITE_OTHER): Payer: Commercial Managed Care - HMO

## 2014-12-25 DIAGNOSIS — M810 Age-related osteoporosis without current pathological fracture: Secondary | ICD-10-CM

## 2014-12-25 DIAGNOSIS — Z1231 Encounter for screening mammogram for malignant neoplasm of breast: Secondary | ICD-10-CM | POA: Diagnosis not present

## 2014-12-25 DIAGNOSIS — Z13820 Encounter for screening for osteoporosis: Secondary | ICD-10-CM | POA: Diagnosis not present

## 2014-12-25 DIAGNOSIS — R937 Abnormal findings on diagnostic imaging of other parts of musculoskeletal system: Secondary | ICD-10-CM

## 2014-12-25 DIAGNOSIS — Z780 Asymptomatic menopausal state: Secondary | ICD-10-CM | POA: Diagnosis not present

## 2014-12-25 DIAGNOSIS — M8580 Other specified disorders of bone density and structure, unspecified site: Secondary | ICD-10-CM

## 2014-12-25 NOTE — Telephone Encounter (Signed)
Pt called today wanting to know if you had talked to the radiologist regarding the need for a cervical mri.  I looked back at her ct & saw the recommendation but she said that you had some concerns about that.  I told her that you were out of the office until Tues and she was ok with waiting until then.

## 2014-12-30 NOTE — Telephone Encounter (Signed)
I think we should go ahead and schedule the MRI.

## 2014-12-31 ENCOUNTER — Other Ambulatory Visit: Payer: Self-pay | Admitting: *Deleted

## 2014-12-31 ENCOUNTER — Other Ambulatory Visit: Payer: Self-pay | Admitting: Family Medicine

## 2014-12-31 DIAGNOSIS — R937 Abnormal findings on diagnostic imaging of other parts of musculoskeletal system: Secondary | ICD-10-CM

## 2014-12-31 MED ORDER — ALENDRONATE SODIUM 70 MG PO TABS: 70.0000 mg | ORAL_TABLET | ORAL | Status: DC

## 2014-12-31 NOTE — Telephone Encounter (Signed)
LMOM notifying pt to go through with the MRI.  We will work on the [place].

## 2015-01-07 DIAGNOSIS — L820 Inflamed seborrheic keratosis: Secondary | ICD-10-CM | POA: Diagnosis not present

## 2015-01-07 DIAGNOSIS — L719 Rosacea, unspecified: Secondary | ICD-10-CM | POA: Diagnosis not present

## 2015-01-13 ENCOUNTER — Telehealth: Payer: Self-pay

## 2015-01-13 ENCOUNTER — Other Ambulatory Visit: Payer: Commercial Managed Care - HMO

## 2015-01-13 DIAGNOSIS — I10 Essential (primary) hypertension: Secondary | ICD-10-CM

## 2015-01-15 DIAGNOSIS — I10 Essential (primary) hypertension: Secondary | ICD-10-CM | POA: Diagnosis not present

## 2015-01-16 LAB — BASIC METABOLIC PANEL WITH GFR
BUN: 18 mg/dL (ref 6–23)
CALCIUM: 9.5 mg/dL (ref 8.4–10.5)
CHLORIDE: 105 meq/L (ref 96–112)
CO2: 26 meq/L (ref 19–32)
CREATININE: 1.03 mg/dL (ref 0.50–1.10)
GFR, EST AFRICAN AMERICAN: 62 mL/min
GFR, EST NON AFRICAN AMERICAN: 54 mL/min — AB
GLUCOSE: 111 mg/dL — AB (ref 70–99)
POTASSIUM: 4.9 meq/L (ref 3.5–5.3)
Sodium: 141 mEq/L (ref 135–145)

## 2015-01-16 NOTE — Telephone Encounter (Signed)
Quick Note:  All labs are normal. ______ 

## 2015-01-18 ENCOUNTER — Ambulatory Visit (HOSPITAL_BASED_OUTPATIENT_CLINIC_OR_DEPARTMENT_OTHER)
Admission: RE | Admit: 2015-01-18 | Discharge: 2015-01-18 | Disposition: A | Discharge status: Home / Self Care | Payer: Commercial Managed Care - HMO | Source: Ambulatory Visit | Attending: Family Medicine | Admitting: Family Medicine

## 2015-01-18 DIAGNOSIS — M5020 Other cervical disc displacement, unspecified cervical region: Secondary | ICD-10-CM | POA: Diagnosis not present

## 2015-01-18 DIAGNOSIS — R937 Abnormal findings on diagnostic imaging of other parts of musculoskeletal system: Secondary | ICD-10-CM | POA: Insufficient documentation

## 2015-01-18 MED ORDER — GADOBENATE DIMEGLUMINE 529 MG/ML IV SOLN: 14.0000 mL | Freq: Once | INTRAVENOUS | Status: AC | PRN

## 2015-01-30 ENCOUNTER — Other Ambulatory Visit: Payer: Self-pay | Admitting: Family Medicine

## 2015-01-30 NOTE — Telephone Encounter (Signed)
Received a refill request from [facility] for Acyclovir 400mg  and Promethazine 25mg . Called Pt to see if she is still taking these Rx's, she states she takes them as needed. Advised since it has been a while for either Rx to be written I would route to PCP for approval/denial. Verbalized understanding.

## 2015-01-31 MED ORDER — ACYCLOVIR 400 MG PO TABS: 400.0000 mg | ORAL_TABLET | Freq: Every day | ORAL | Status: DC

## 2015-01-31 MED ORDER — PROMETHAZINE HCL 25 MG PO TABS: 25.0000 mg | ORAL_TABLET | Freq: Three times a day (TID) | ORAL | Status: DC | PRN

## 2015-02-04 ENCOUNTER — Other Ambulatory Visit: Payer: Self-pay | Admitting: Family Medicine

## 2015-02-04 ENCOUNTER — Telehealth: Payer: Self-pay | Admitting: Family Medicine

## 2015-02-04 MED ORDER — PROPRANOLOL HCL 40 MG PO TABS: 40.0000 mg | ORAL_TABLET | Freq: Two times a day (BID) | ORAL | Status: DC

## 2015-02-04 NOTE — Progress Notes (Signed)
Changed Propranolol ER to inderal 40mg  BID for cost savings per recommendation by pharmacy. Sent to [facility].

## 2015-02-04 NOTE — Telephone Encounter (Addendum)
Received fax for prior authorization on Promethazine HCL sent through cover my meds and received an approval good until 09/20/2015. -CF

## 2015-02-12 ENCOUNTER — Other Ambulatory Visit: Payer: Self-pay | Admitting: Family Medicine

## 2015-02-13 ENCOUNTER — Other Ambulatory Visit: Payer: Self-pay | Admitting: Family Medicine

## 2015-02-14 ENCOUNTER — Other Ambulatory Visit: Payer: Self-pay

## 2015-02-14 MED ORDER — BUPROPION HCL ER (XL) 300 MG PO TB24: 300.0000 mg | ORAL_TABLET | Freq: Every day | ORAL | Status: DC

## 2015-02-19 ENCOUNTER — Other Ambulatory Visit: Payer: Self-pay | Admitting: Family Medicine

## 2015-02-19 MED ORDER — ACYCLOVIR 400 MG PO TABS: 400.0000 mg | ORAL_TABLET | Freq: Every day | ORAL | Status: DC

## 2015-03-11 ENCOUNTER — Other Ambulatory Visit: Payer: Self-pay | Admitting: Family Medicine

## 2015-03-16 ENCOUNTER — Other Ambulatory Visit: Payer: Self-pay | Admitting: Family Medicine

## 2015-04-18 ENCOUNTER — Encounter: Payer: Self-pay | Admitting: Family Medicine

## 2015-04-18 ENCOUNTER — Ambulatory Visit (INDEPENDENT_AMBULATORY_CARE_PROVIDER_SITE_OTHER): Payer: Commercial Managed Care - HMO | Admitting: Family Medicine

## 2015-04-18 VITALS — BP 148/80 | HR 82 | Ht 60.0 in | Wt 151.0 lb

## 2015-04-18 DIAGNOSIS — R937 Abnormal findings on diagnostic imaging of other parts of musculoskeletal system: Secondary | ICD-10-CM

## 2015-04-18 DIAGNOSIS — Z23 Encounter for immunization: Secondary | ICD-10-CM | POA: Diagnosis not present

## 2015-04-18 DIAGNOSIS — F341 Dysthymic disorder: Secondary | ICD-10-CM

## 2015-04-18 DIAGNOSIS — E785 Hyperlipidemia, unspecified: Secondary | ICD-10-CM | POA: Diagnosis not present

## 2015-04-18 MED ORDER — AMBULATORY NON FORMULARY MEDICATION: Status: DC

## 2015-04-18 MED ORDER — PROPRANOLOL HCL 40 MG PO TABS: 40.0000 mg | ORAL_TABLET | Freq: Two times a day (BID) | ORAL | Status: DC

## 2015-04-18 MED ORDER — ATORVASTATIN CALCIUM 40 MG PO TABS: 40.0000 mg | ORAL_TABLET | Freq: Every day | ORAL | Status: DC

## 2015-04-18 MED ORDER — ACYCLOVIR 400 MG PO TABS: 400.0000 mg | ORAL_TABLET | Freq: Every day | ORAL | Status: DC

## 2015-04-18 MED ORDER — BUPROPION HCL ER (XL) 300 MG PO TB24: 300.0000 mg | ORAL_TABLET | Freq: Every day | ORAL | Status: DC

## 2015-04-18 NOTE — Progress Notes (Signed)
   Subjective:    Patient ID: Nancy Wilson, female    DOB: 1940/12/25, 74 y.o.   MRN: [id number]  HPI Hypertension- Pt denies chest pain, SOB, dizziness, or heart palpitations.  Taking meds as directed w/o problems.  Denies medication side effects.   Home BPs running 125-136/78-88.    Abnormal C3 vertebrae. She's been completely asymptomatic. No neck pain or fevers or chills. No abnormal weight loss.  Anxiety /depression Wants to see if we can change to wellbutrin. Price has gone up.  She really likes it though because it does give her some energy and really doesn't want to discontinue it.. She takes the citalopram more for mood. We did check into insurance coverage to see if the regular release Wellbutrin was cheaper but it is the same tier 3 as the extended release version.   Review of Systems     Objective:   Physical Exam  Constitutional: She is oriented to person, place, and time. She appears well-developed and well-nourished.  HENT:  Head: Normocephalic and atraumatic.  Cardiovascular: Normal rate, regular rhythm and normal heart sounds.   Pulmonary/Chest: Effort normal and breath sounds normal.  Neurological: She is alert and oriented to person, place, and time.  Skin: Skin is warm and dry.  Psychiatric: She has a normal mood and affect. Her behavior is normal.          Assessment & Plan:  Hypertension-well controlled based on home blood pressures but a little bit elevated here..    Abnormal C3 vertebra-due for repeat scan at the end of October early November, for 6 month follow-up. We can do this with CT or MRI. I would prefer MRI to reduce radiation exposure.. I do strongly recommend that she get repeat scanning since we did not decide to do a bone scan. Calcium and alkaline phosphatase levels have been normal so I think Paget's is less likely.  Anxiety/depression-continue citalopram. If she uses mail order further Wellbutrin she can get a 90 day supply for $72.  Expanders really not a good replacement for this but we could always wean it and just use the citalopram by itself. She really didn't want to do that. So we can send it to mail order.  Due for tetanus shot.  Given rx since covered under Medicare Part D. Dur for shingles as well.

## 2015-04-29 ENCOUNTER — Ambulatory Visit: Payer: Commercial Managed Care - HMO | Admitting: Family Medicine

## 2015-05-02 DIAGNOSIS — Z23 Encounter for immunization: Secondary | ICD-10-CM | POA: Diagnosis not present

## 2015-05-02 DIAGNOSIS — E785 Hyperlipidemia, unspecified: Secondary | ICD-10-CM | POA: Diagnosis not present

## 2015-05-03 LAB — COMPLETE METABOLIC PANEL WITH GFR
ALK PHOS: 90 U/L (ref 33–130)
ALT: 14 U/L (ref 6–29)
AST: 20 U/L (ref 10–35)
Albumin: 4 g/dL (ref 3.6–5.1)
BILIRUBIN TOTAL: 0.6 mg/dL (ref 0.2–1.2)
BUN: 15 mg/dL (ref 7–25)
CO2: 24 mmol/L (ref 20–31)
CREATININE: 1.18 mg/dL — AB (ref 0.60–0.93)
Calcium: 9.3 mg/dL (ref 8.6–10.4)
Chloride: 107 mmol/L (ref 98–110)
GFR, EST NON AFRICAN AMERICAN: 46 mL/min — AB (ref >=60)
GFR, Est African American: 53 mL/min — ABNORMAL LOW (ref >=60)
GLUCOSE: 100 mg/dL — AB (ref 65–99)
Potassium: 4.9 mmol/L (ref 3.5–5.3)
Sodium: 144 mmol/L (ref 135–146)
TOTAL PROTEIN: 6.4 g/dL (ref 6.1–8.1)

## 2015-05-03 LAB — LIPID PANEL
CHOL/HDL RATIO: 3.8 ratio (ref <=5.0)
Cholesterol: 228 mg/dL — ABNORMAL HIGH (ref 125–200)
HDL: 60 mg/dL (ref >=46)
LDL CALC: 147 mg/dL — AB (ref <130)
TRIGLYCERIDES: 106 mg/dL (ref <150)
VLDL: 21 mg/dL (ref <30)

## 2015-05-08 ENCOUNTER — Ambulatory Visit: Payer: Commercial Managed Care - HMO | Admitting: Family Medicine

## 2015-07-07 ENCOUNTER — Encounter: Payer: Self-pay | Admitting: *Deleted

## 2015-07-07 ENCOUNTER — Emergency Department (INDEPENDENT_AMBULATORY_CARE_PROVIDER_SITE_OTHER): Payer: Commercial Managed Care - HMO

## 2015-07-07 ENCOUNTER — Emergency Department
Admission: EM | Admit: 2015-07-07 | Discharge: 2015-07-07 | Disposition: A | Discharge status: Home / Self Care | Payer: Commercial Managed Care - HMO | Source: Home / Self Care | Attending: Family Medicine | Admitting: Family Medicine

## 2015-07-07 DIAGNOSIS — M545 Low back pain, unspecified: Secondary | ICD-10-CM

## 2015-07-07 DIAGNOSIS — M47896 Other spondylosis, lumbar region: Secondary | ICD-10-CM | POA: Diagnosis not present

## 2015-07-07 DIAGNOSIS — R10A1 Flank pain, right side: Secondary | ICD-10-CM

## 2015-07-07 DIAGNOSIS — M25551 Pain in right hip: Secondary | ICD-10-CM

## 2015-07-07 DIAGNOSIS — R109 Unspecified abdominal pain: Secondary | ICD-10-CM

## 2015-07-07 DIAGNOSIS — M47816 Spondylosis without myelopathy or radiculopathy, lumbar region: Secondary | ICD-10-CM | POA: Diagnosis not present

## 2015-07-07 LAB — POCT URINALYSIS DIP (MANUAL ENTRY)
Bilirubin, UA: NEGATIVE
Blood, UA: NEGATIVE
Glucose, UA: NEGATIVE
Ketones, POC UA: NEGATIVE
Nitrite, UA: NEGATIVE
Protein Ur, POC: NEGATIVE
Spec Grav, UA: 1.01 (ref 1.005–1.03)
Urobilinogen, UA: 0.2 (ref 0–1)
pH, UA: 7 (ref 5–8)

## 2015-07-07 MED ORDER — HYDROCODONE-ACETAMINOPHEN 5-325 MG PO TABS: 1.0000 | ORAL_TABLET | Freq: Four times a day (QID) | ORAL | Status: DC | PRN

## 2015-07-07 NOTE — ED Notes (Signed)
Pt c/o left lower back pain without injury x 2 weeks. Pain is worse with bending over. Denies urinary symptoms.

## 2015-07-07 NOTE — Discharge Instructions (Signed)
Norco (hydrocodone-acetaminophen) is a narcotic pain medication, do not combine these medications with others containing tylenol. While taking, do not drink alcohol, drive, or perform any other activities that requires focus while taking these medications.

## 2015-07-07 NOTE — ED Provider Notes (Signed)
CSN: [id number]     Arrival date & time 07/07/15  1053 History   First MD Initiated Contact with Patient 07/07/15 1118     Chief Complaint  Patient presents with  . Back Pain   (Consider location/radiation/quality/duration/timing/severity/associated sxs/prior Treatment) HPI Pt is a 74yo female [facility] to [facility] with c/o gradually worsening Right lower back pain and Right flank pain for 2 weeks. Pain is aching and sharp. Worse in certain positions including prolonged sitting. States she woke up with the pain.  She has been taking 800mg  Ibuprofen with temporary relief.  Denies known injury including fall or heavy lifting. Denies change in bowel or bladder habits. Denies back surgeries. Pain does not radiate into leg.   Past Medical History  Diagnosis Date  . Allergy   . Depression     long standing  . Hyperlipidemia   . Hypertension    Past Surgical History  Procedure Laterality Date  . Carpel tunnel release, both hands  1980's  . Trigger finger release, left finger    . Dilation and curettage of uterus    . Gastroscopy  11-06  . Tubal ligation      bilateral   Family History  Problem Relation Age of Onset  . Diabetes Mother   . Hyperlipidemia Mother   . Heart disease Mother 7221  . Diabetes Father   . Hyperlipidemia Father   . Tremor Father     hand- started in his 3550's  . Tremor Cousin   . Hypertension Mother   . Hypertension Father    Social History  Substance Use Topics  . Smoking status: Never Smoker   . Smokeless tobacco: None  . Alcohol Use: No   OB History    No data available     Review of Systems  Constitutional: Negative for fever and chills.  Gastrointestinal: Negative for nausea, vomiting and abdominal pain.  Genitourinary: Positive for flank pain (Right). Negative for dysuria, urgency, frequency, hematuria, decreased urine volume and pelvic pain.  Musculoskeletal: Positive for myalgias, back pain and arthralgias.  Skin: Negative for rash and wound.     Allergies  Codeine; Doxycycline; Guaifenesin; Meperidine hcl; Metronidazole; Morphine; Penicillins; and Propoxyphene  Home Medications   Prior to Admission medications   Medication Sig Start Date End Date Taking? Authorizing Provider  acyclovir (ZOVIRAX) 400 MG tablet Take 1 tablet (400 mg total) by mouth at bedtime. 04/18/15   Agapito Gamesatherine D Metheney, MD  AMBULATORY NON FORMULARY MEDICATION Medication Name: Tdap and Shingles vaccine IM once. 04/18/15   Agapito Gamesatherine D Metheney, MD  AMBULATORY NON FORMULARY MEDICATION Medication Name: moderate grade compression stocking, panty hose style . Around 20mm Hg bilateral. 04/18/15   Agapito Gamesatherine D Metheney, MD  AMBULATORY NON FORMULARY MEDICATION Support hose - size appropriate - Diagnosis Edema 04/18/15   Agapito Gamesatherine D Metheney, MD  amLODipine (NORVASC) 5 MG tablet Take 1 tablet (5 mg total) by mouth daily. 12/17/14   Agapito Gamesatherine D Metheney, MD  atorvastatin (LIPITOR) 40 MG tablet Take 1 tablet (40 mg total) by mouth daily. 04/18/15   Agapito Gamesatherine D Metheney, MD  buPROPion (WELLBUTRIN XL) 300 MG 24 hr tablet Take 1 tablet (300 mg total) by mouth daily. 04/18/15   Agapito Gamesatherine D Metheney, MD  cholecalciferol (VITAMIN D) 1000 UNITS tablet Take 2,000 Units by mouth daily.    Historical Provider, MD  citalopram (CELEXA) 40 MG tablet Take 40 mg by mouth daily. 12/07/14   Historical Provider, MD  HYDROcodone-acetaminophen (NORCO/VICODIN) 5-325 MG tablet Take 1 tablet by  mouth every 6 (six) hours as needed for moderate pain or severe pain. 07/07/15   Junius Finner, PA-C  omeprazole (PRILOSEC) 40 MG capsule Take 1 capsule (40 mg total) by mouth daily. 12/17/14   Agapito Games, MD  promethazine (PHENERGAN) 25 MG tablet Take 1 tablet (25 mg total) by mouth every 8 (eight) hours as needed for nausea or vomiting. 01/31/15   Agapito Games, MD  propranolol (INDERAL) 40 MG tablet Take 1 tablet (40 mg total) by mouth 2 (two) times daily. 04/18/15   Agapito Games, MD   SUMAtriptan (IMITREX) 100 MG tablet Take 1 tablet (100 mg total) by mouth every 2 (two) hours as needed for migraine. May repeat in 2 hours if headache persists or recurs. 12/17/14   Agapito Games, MD   Meds Ordered and Administered this Visit  Medications - No data to display  BP 161/97 mmHg  Pulse 86  Temp(Src) 98.6 F (37 C) (Oral)  Resp 16  Wt 153 lb (69.4 kg)  SpO2 99% No data found.   Physical Exam  Constitutional: She is oriented to person, place, and time. She appears well-developed and well-nourished.  HENT:  Head: Normocephalic and atraumatic.  Eyes: EOM are normal.  Neck: Normal range of motion.  Cardiovascular: Normal rate.   Pulmonary/Chest: Effort normal.  Abdominal: Soft. She exhibits no distension and no mass. There is tenderness. There is no rebound and no guarding.  Mild Right flank tenderness  Musculoskeletal: Normal range of motion. She exhibits tenderness.  No midline spinal tenderness. Tenderness to Right lumbar muscles, right upper buttock, Right flank and Right hip.  Full ROM upper and lower extremities bilaterally. Normal gait  Neurological: She is alert and oriented to person, place, and time.  Skin: Skin is warm and dry. No rash noted. No erythema.  Psychiatric: She has a normal mood and affect. Her behavior is normal.  Nursing note and vitals reviewed.   ED Course  Procedures (including critical care time)  Labs Review Labs Reviewed  POCT URINALYSIS DIP (MANUAL ENTRY) - Abnormal; Notable for the following:    Leukocytes, UA small (1+) (*)    All other components within normal limits    Imaging Review Dg Lumbar Spine Complete  07/07/2015  CLINICAL DATA:  Low back pain and right hip pain for 2 weeks. No known injury. EXAM: LUMBAR SPINE - COMPLETE 4+ VIEW COMPARISON:  CT scan of the abdomen dated 04/15/2008 FINDINGS: There is no fracture or subluxation or disc space narrowing. There is slight facet arthritis at L4-5 and L5-S1. Small  chronic calcified disc bulge at L5-S1. IMPRESSION: Slight degenerative facet arthritis in the lower lumbar spine. No acute abnormality. Electronically Signed   By: Francene Boyers M.D.   On: 07/07/2015 12:41   Dg Hip Unilat With Pelvis 2-3 Views Right  07/07/2015  CLINICAL DATA:  Low back pain and right hip pain for 2 weeks. EXAM: DG HIP (WITH OR WITHOUT PELVIS) 2-3V RIGHT COMPARISON:  None. FINDINGS: There is no evidence of hip fracture or dislocation. Slight degenerative changes of the superior lateral aspect of the acetabuli, more on the left than the right. IMPRESSION: No acute abnormality. Electronically Signed   By: Francene Boyers M.D.   On: 07/07/2015 12:37        MDM   1. Right-sided low back pain without sciatica   2. Right flank pain     Pt c/o Right lower back and flank pain. Tenderness to Right lumbar muscles and  Right hip.  No rash or ecchymosis. No red flag symptoms or known injury.  Plain films: slight degenerative facet arthritis in lower lumbar spine, otherwise normal films. Offered pt to try Norco for severe pain. Encouraged to eat something when taking to reduce risk of nausea and vomiting. Pt may continue ibuprofen. F/u with PCP in 1 week if not improving. Pt states she has f/u on Nov 4 for headaches but advised pt if back pain not improving, she may want to consider scheduling a separate f/u visit for her back pain. Patient verbalized understanding and agreement with treatment plan.  Junius Finner, PA-C 07/07/15 1329

## 2015-07-16 ENCOUNTER — Telehealth: Payer: Self-pay | Admitting: Family Medicine

## 2015-07-16 DIAGNOSIS — L570 Actinic keratosis: Secondary | ICD-10-CM

## 2015-07-16 NOTE — Telephone Encounter (Signed)
Pt called and states she is a patient at [facility] in [place] and her referral has expired and she needs a new referral put in and it submitted to [employer]. Please put in Comments [facility]

## 2015-07-16 NOTE — Telephone Encounter (Signed)
Referral sent 

## 2015-07-25 ENCOUNTER — Ambulatory Visit: Payer: Commercial Managed Care - HMO | Admitting: Family Medicine

## 2015-08-18 ENCOUNTER — Ambulatory Visit: Payer: Commercial Managed Care - HMO | Admitting: Family Medicine

## 2015-08-21 ENCOUNTER — Ambulatory Visit: Payer: Commercial Managed Care - HMO | Admitting: Family Medicine

## 2015-10-22 ENCOUNTER — Telehealth: Payer: Self-pay | Admitting: Family Medicine

## 2015-10-22 DIAGNOSIS — R9389 Abnormal findings on diagnostic imaging of other specified body structures: Secondary | ICD-10-CM

## 2015-10-22 NOTE — Telephone Encounter (Signed)
Referral placed.

## 2015-10-22 NOTE — Telephone Encounter (Signed)
Needs a referral put in to see Dr. Corinna Capra -- Ortho on Monday

## 2015-11-17 ENCOUNTER — Other Ambulatory Visit: Payer: Self-pay | Admitting: *Deleted

## 2015-11-17 DIAGNOSIS — M65331 Trigger finger, right middle finger: Secondary | ICD-10-CM | POA: Diagnosis not present

## 2015-11-17 DIAGNOSIS — M25512 Pain in left shoulder: Secondary | ICD-10-CM | POA: Diagnosis not present

## 2015-11-17 DIAGNOSIS — E119 Type 2 diabetes mellitus without complications: Secondary | ICD-10-CM

## 2015-11-17 DIAGNOSIS — Z0100 Encounter for examination of eyes and vision without abnormal findings: Principal | ICD-10-CM

## 2015-11-17 DIAGNOSIS — L570 Actinic keratosis: Secondary | ICD-10-CM

## 2015-11-17 DIAGNOSIS — M79641 Pain in right hand: Secondary | ICD-10-CM | POA: Diagnosis not present

## 2015-12-01 DIAGNOSIS — M65331 Trigger finger, right middle finger: Secondary | ICD-10-CM | POA: Insufficient documentation

## 2015-12-04 DIAGNOSIS — G43909 Migraine, unspecified, not intractable, without status migrainosus: Secondary | ICD-10-CM | POA: Diagnosis not present

## 2015-12-04 DIAGNOSIS — K219 Gastro-esophageal reflux disease without esophagitis: Secondary | ICD-10-CM | POA: Diagnosis not present

## 2015-12-04 DIAGNOSIS — F329 Major depressive disorder, single episode, unspecified: Secondary | ICD-10-CM | POA: Diagnosis not present

## 2015-12-04 DIAGNOSIS — M65331 Trigger finger, right middle finger: Secondary | ICD-10-CM | POA: Diagnosis not present

## 2015-12-04 DIAGNOSIS — E785 Hyperlipidemia, unspecified: Secondary | ICD-10-CM | POA: Diagnosis not present

## 2015-12-04 DIAGNOSIS — Z888 Allergy status to other drugs, medicaments and biological substances status: Secondary | ICD-10-CM | POA: Diagnosis not present

## 2015-12-04 DIAGNOSIS — I10 Essential (primary) hypertension: Secondary | ICD-10-CM | POA: Diagnosis not present

## 2015-12-04 DIAGNOSIS — F419 Anxiety disorder, unspecified: Secondary | ICD-10-CM | POA: Diagnosis not present

## 2015-12-04 DIAGNOSIS — M1990 Unspecified osteoarthritis, unspecified site: Secondary | ICD-10-CM | POA: Diagnosis not present

## 2015-12-18 ENCOUNTER — Ambulatory Visit: Payer: Commercial Managed Care - HMO | Admitting: Family Medicine

## 2015-12-19 ENCOUNTER — Ambulatory Visit: Payer: Commercial Managed Care - HMO | Admitting: Family Medicine

## 2015-12-29 ENCOUNTER — Ambulatory Visit: Payer: Commercial Managed Care - HMO | Admitting: Family Medicine

## 2016-01-19 ENCOUNTER — Telehealth: Payer: Self-pay

## 2016-01-19 DIAGNOSIS — H538 Other visual disturbances: Secondary | ICD-10-CM

## 2016-01-19 NOTE — Telephone Encounter (Signed)
Patient would like a 2 nd opinion about her blurred vision.

## 2016-01-31 ENCOUNTER — Other Ambulatory Visit: Payer: Self-pay | Admitting: Family Medicine

## 2016-02-03 ENCOUNTER — Telehealth: Payer: Self-pay | Admitting: Family Medicine

## 2016-02-03 NOTE — Telephone Encounter (Signed)
I called and spoke to patient to let her know she would need to make an appointment to get her medication refills. Patient stated she would call back and schedule after she looks at her calendar. - CF

## 2016-02-25 ENCOUNTER — Other Ambulatory Visit: Payer: Self-pay | Admitting: Family Medicine

## 2016-02-25 DIAGNOSIS — Z1231 Encounter for screening mammogram for malignant neoplasm of breast: Secondary | ICD-10-CM

## 2016-03-03 ENCOUNTER — Telehealth: Payer: Self-pay | Admitting: Family Medicine

## 2016-03-03 DIAGNOSIS — H2513 Age-related nuclear cataract, bilateral: Secondary | ICD-10-CM | POA: Diagnosis not present

## 2016-03-03 DIAGNOSIS — H02834 Dermatochalasis of left upper eyelid: Secondary | ICD-10-CM | POA: Diagnosis not present

## 2016-03-03 DIAGNOSIS — Z135 Encounter for screening for eye and ear disorders: Secondary | ICD-10-CM | POA: Diagnosis not present

## 2016-03-03 DIAGNOSIS — H52203 Unspecified astigmatism, bilateral: Secondary | ICD-10-CM | POA: Diagnosis not present

## 2016-03-03 DIAGNOSIS — H02831 Dermatochalasis of right upper eyelid: Secondary | ICD-10-CM | POA: Diagnosis not present

## 2016-03-03 NOTE — Telephone Encounter (Signed)
Patient is needing a referral for [facility] fax# [phone]/ Phone [phone]

## 2016-03-04 NOTE — Telephone Encounter (Signed)
Called and informed pt that her referral was approved thru 05/15/16. She stated that her appt is on 05/19/16 . I spoke with Arline AspCindy and informed her of this and she will contact [employer] to get this approved for that date. I told pt that when she has her surgery Dr. Lonia SkinnerHagerman's office with get that prior auth thru her ins and if she should need any f/u visits to call and we would send a new referral for this. Pt voiced understanding.Laureen Ochs.Yashica Sterbenz, Viann Shoveonya Lynetta

## 2016-03-04 NOTE — Telephone Encounter (Signed)
Ok to place referral.

## 2016-03-10 ENCOUNTER — Ambulatory Visit: Payer: Commercial Managed Care - HMO

## 2016-03-17 ENCOUNTER — Ambulatory Visit: Payer: Commercial Managed Care - HMO

## 2016-03-31 ENCOUNTER — Ambulatory Visit: Payer: Commercial Managed Care - HMO

## 2016-04-07 ENCOUNTER — Ambulatory Visit: Payer: Commercial Managed Care - HMO

## 2016-04-14 ENCOUNTER — Ambulatory Visit (INDEPENDENT_AMBULATORY_CARE_PROVIDER_SITE_OTHER): Payer: Commercial Managed Care - HMO

## 2016-04-14 DIAGNOSIS — Z1231 Encounter for screening mammogram for malignant neoplasm of breast: Secondary | ICD-10-CM | POA: Diagnosis not present

## 2016-05-19 DIAGNOSIS — H25813 Combined forms of age-related cataract, bilateral: Secondary | ICD-10-CM | POA: Diagnosis not present

## 2016-05-19 DIAGNOSIS — H52203 Unspecified astigmatism, bilateral: Secondary | ICD-10-CM | POA: Diagnosis not present

## 2016-05-19 DIAGNOSIS — H02831 Dermatochalasis of right upper eyelid: Secondary | ICD-10-CM | POA: Diagnosis not present

## 2016-05-19 DIAGNOSIS — H02834 Dermatochalasis of left upper eyelid: Secondary | ICD-10-CM | POA: Diagnosis not present

## 2016-05-20 ENCOUNTER — Encounter: Payer: Self-pay | Admitting: Family Medicine

## 2016-05-20 DIAGNOSIS — H43812 Vitreous degeneration, left eye: Secondary | ICD-10-CM | POA: Insufficient documentation

## 2016-05-28 ENCOUNTER — Telehealth: Payer: Self-pay | Admitting: Family Medicine

## 2016-05-28 DIAGNOSIS — M7732 Calcaneal spur, left foot: Secondary | ICD-10-CM

## 2016-05-28 DIAGNOSIS — L989 Disorder of the skin and subcutaneous tissue, unspecified: Secondary | ICD-10-CM

## 2016-05-28 NOTE — Telephone Encounter (Signed)
Referrals placed 

## 2016-05-28 NOTE — Telephone Encounter (Signed)
Ok to  place both referrals. 

## 2016-05-28 NOTE — Telephone Encounter (Signed)
Pt called.  She wants referral to podiatrist/[facility] - she has an appointment scheduled for Sept 28th. She also wants a referral to Dermatologist/Robert Migliardi for places on her face.

## 2016-06-15 DIAGNOSIS — L719 Rosacea, unspecified: Secondary | ICD-10-CM | POA: Diagnosis not present

## 2016-06-17 DIAGNOSIS — M7752 Other enthesopathy of left foot: Secondary | ICD-10-CM | POA: Diagnosis not present

## 2016-06-17 DIAGNOSIS — M24571 Contracture, right ankle: Secondary | ICD-10-CM | POA: Diagnosis not present

## 2016-06-17 DIAGNOSIS — M24572 Contracture, left ankle: Secondary | ICD-10-CM | POA: Diagnosis not present

## 2016-06-17 DIAGNOSIS — M722 Plantar fascial fibromatosis: Secondary | ICD-10-CM | POA: Diagnosis not present

## 2016-06-17 DIAGNOSIS — M7732 Calcaneal spur, left foot: Secondary | ICD-10-CM | POA: Diagnosis not present

## 2016-07-30 DIAGNOSIS — Z23 Encounter for immunization: Secondary | ICD-10-CM | POA: Diagnosis not present

## 2016-08-02 ENCOUNTER — Telehealth: Payer: Self-pay

## 2016-08-02 DIAGNOSIS — M79672 Pain in left foot: Secondary | ICD-10-CM

## 2016-08-02 DIAGNOSIS — M79671 Pain in right foot: Secondary | ICD-10-CM

## 2016-08-02 NOTE — Telephone Encounter (Signed)
Patient requests a referral to Podiatry.

## 2016-08-23 DIAGNOSIS — I10 Essential (primary) hypertension: Secondary | ICD-10-CM | POA: Diagnosis not present

## 2016-08-23 DIAGNOSIS — M2041 Other hammer toe(s) (acquired), right foot: Secondary | ICD-10-CM | POA: Diagnosis not present

## 2016-08-23 DIAGNOSIS — M24571 Contracture, right ankle: Secondary | ICD-10-CM | POA: Diagnosis not present

## 2016-08-23 DIAGNOSIS — M7742 Metatarsalgia, left foot: Secondary | ICD-10-CM | POA: Diagnosis not present

## 2016-08-23 DIAGNOSIS — M7741 Metatarsalgia, right foot: Secondary | ICD-10-CM | POA: Diagnosis not present

## 2016-08-23 DIAGNOSIS — M24572 Contracture, left ankle: Secondary | ICD-10-CM | POA: Diagnosis not present

## 2016-08-23 DIAGNOSIS — M2042 Other hammer toe(s) (acquired), left foot: Secondary | ICD-10-CM | POA: Diagnosis not present

## 2016-10-08 ENCOUNTER — Other Ambulatory Visit: Payer: Self-pay

## 2016-10-08 MED ORDER — ACYCLOVIR 400 MG PO TABS: 400.0000 mg | ORAL_TABLET | Freq: Every day | ORAL | 0 refills | Status: DC

## 2016-10-21 ENCOUNTER — Ambulatory Visit: Payer: Commercial Managed Care - HMO | Admitting: Family Medicine

## 2016-11-09 ENCOUNTER — Ambulatory Visit: Payer: Commercial Managed Care - HMO | Admitting: Family Medicine

## 2016-11-09 ENCOUNTER — Ambulatory Visit: Payer: Commercial Managed Care - HMO

## 2016-11-18 ENCOUNTER — Other Ambulatory Visit: Payer: Self-pay

## 2016-11-18 MED ORDER — PROPRANOLOL HCL 40 MG PO TABS: 40.0000 mg | ORAL_TABLET | Freq: Two times a day (BID) | ORAL | 0 refills | Status: DC

## 2016-11-18 MED ORDER — ACYCLOVIR 400 MG PO TABS: 400.0000 mg | ORAL_TABLET | Freq: Every day | ORAL | 0 refills | Status: DC

## 2016-11-23 ENCOUNTER — Ambulatory Visit: Payer: Commercial Managed Care - HMO | Admitting: Family Medicine

## 2016-11-25 ENCOUNTER — Ambulatory Visit: Payer: Commercial Managed Care - HMO | Admitting: Family Medicine

## 2016-11-26 ENCOUNTER — Ambulatory Visit: Payer: Commercial Managed Care - HMO | Admitting: Family Medicine

## 2016-12-02 ENCOUNTER — Ambulatory Visit (INDEPENDENT_AMBULATORY_CARE_PROVIDER_SITE_OTHER): Payer: Medicare HMO | Admitting: Family Medicine

## 2016-12-02 ENCOUNTER — Encounter: Payer: Self-pay | Admitting: Family Medicine

## 2016-12-02 VITALS — BP 160/82 | HR 79 | Ht 60.0 in | Wt 154.0 lb

## 2016-12-02 DIAGNOSIS — I10 Essential (primary) hypertension: Secondary | ICD-10-CM

## 2016-12-02 DIAGNOSIS — F341 Dysthymic disorder: Secondary | ICD-10-CM | POA: Diagnosis not present

## 2016-12-02 DIAGNOSIS — G43009 Migraine without aura, not intractable, without status migrainosus: Secondary | ICD-10-CM

## 2016-12-02 DIAGNOSIS — R252 Cramp and spasm: Secondary | ICD-10-CM | POA: Diagnosis not present

## 2016-12-02 MED ORDER — ELETRIPTAN HYDROBROMIDE 20 MG PO TABS: 20.0000 mg | ORAL_TABLET | ORAL | 1 refills | Status: DC | PRN

## 2016-12-02 MED ORDER — AMLODIPINE BESYLATE 10 MG PO TABS: 10.0000 mg | ORAL_TABLET | Freq: Every day | ORAL | 1 refills | Status: DC

## 2016-12-02 NOTE — Patient Instructions (Signed)
I am increasing your amlodipine to 10mg .

## 2016-12-02 NOTE — Progress Notes (Signed)
Subjective:    CC: Headaches  HPI:   Nancy Wilson comes in today complaining of more frequent headaches. She says that she's noticed that her blood pressures also been running a little higher. She says some days were at higher it will tend to trigger a headache. Sometimes the headaches can be severe to the point that she actually vomits. She also has a history of migraines.  Hypertension- Pt denies chest pain, SOB, dizziness, or heart palpitations.  Taking meds as directed w/o problems.  Denies medication side effects.    Anxiety/depression-check she feels like she is doing really well on the Wellbutrin and would like to continue it at this time.  She also complains of excess muscle cramping. It tends to affect her hands as well as her legs. She's is been going on for probably a year or more. She said she tried taking over-the-counter potassium and actually aggravated her symptoms.  Past medical history, Surgical history, Family history not pertinant except as noted below, Social history, Allergies, and medications have been entered into the medical record, reviewed, and corrections made.   Review of Systems: No fevers, chills, night sweats, weight loss, chest pain, or shortness of breath.   Objective:    General: Well Developed, well nourished, and in no acute distress.  Neuro: Alert and oriented x3, extra-ocular muscles intact, sensation grossly intact.  HEENT: Normocephalic, atraumatic  Skin: Warm and dry, no rashes. Cardiac: Regular rate and rhythm, no murmurs rubs or gallops, no lower extremity edema.  Respiratory: Clear to auscultation bilaterally. Not using accessory muscles, speaking in full sentences.   Impression and Recommendations:   Migraine headaches-I do feel like some of her headaches have been triggered by her elevated blood pressures. We discussed working diligently to lower her blood pressure and get that under better control first. She also reports that the Imitrex does  not work well for her so we will try Relpax instead.  Muscle cramping - will check thyroid, CK and magnesium and electrolytes.    HTN - uncontrolled.  Increase the amlodipine to 10 mg.   Anxiety/Depression - continue with Wellbutrin doing well. Up in 6 months.

## 2016-12-03 LAB — COMPLETE METABOLIC PANEL WITH GFR
ALT: 16 U/L (ref 6–29)
AST: 21 U/L (ref 10–35)
Albumin: 4.3 g/dL (ref 3.6–5.1)
Alkaline Phosphatase: 92 U/L (ref 33–130)
BUN: 19 mg/dL (ref 7–25)
CALCIUM: 9.5 mg/dL (ref 8.6–10.4)
CHLORIDE: 103 mmol/L (ref 98–110)
CO2: 22 mmol/L (ref 20–31)
Creat: 1.08 mg/dL — ABNORMAL HIGH (ref 0.60–0.93)
GFR, EST NON AFRICAN AMERICAN: 50 mL/min — AB (ref >=60)
GFR, Est African American: 58 mL/min — ABNORMAL LOW (ref >=60)
Glucose, Bld: 92 mg/dL (ref 65–99)
POTASSIUM: 4.7 mmol/L (ref 3.5–5.3)
SODIUM: 140 mmol/L (ref 135–146)
Total Bilirubin: 0.6 mg/dL (ref 0.2–1.2)
Total Protein: 6.7 g/dL (ref 6.1–8.1)

## 2016-12-03 LAB — LIPID PANEL W/REFLEX DIRECT LDL
CHOL/HDL RATIO: 6.5 ratio — AB (ref <5.0)
CHOLESTEROL: 337 mg/dL — AB (ref <200)
HDL: 52 mg/dL (ref >50)
LDL-Cholesterol: 254 mg/dL — ABNORMAL HIGH
NON-HDL CHOLESTEROL (CALC): 285 mg/dL — AB (ref <130)
TRIGLYCERIDES: 148 mg/dL (ref <150)

## 2016-12-03 LAB — CK: CK TOTAL: 87 U/L (ref 7–177)

## 2016-12-03 LAB — TSH: TSH: 2.24 mIU/L

## 2016-12-03 LAB — MAGNESIUM: Magnesium: 2.2 mg/dL (ref 1.5–2.5)

## 2016-12-06 ENCOUNTER — Other Ambulatory Visit: Payer: Self-pay | Admitting: Family Medicine

## 2016-12-06 ENCOUNTER — Telehealth: Payer: Self-pay

## 2016-12-06 NOTE — Telephone Encounter (Signed)
Ok for 90 tab with 3 RF

## 2016-12-06 NOTE — Telephone Encounter (Signed)
Pt stated that she has started taking her lipitor again.  She needs her new script sent through [facility].  Last ordered 04/18/15.  Please advise.

## 2016-12-07 ENCOUNTER — Other Ambulatory Visit: Payer: Self-pay

## 2016-12-07 MED ORDER — ATORVASTATIN CALCIUM 40 MG PO TABS: 40.0000 mg | ORAL_TABLET | Freq: Every day | ORAL | 3 refills | Status: DC

## 2016-12-07 NOTE — Telephone Encounter (Signed)
Sent!

## 2017-03-07 ENCOUNTER — Other Ambulatory Visit: Payer: Self-pay

## 2017-03-07 MED ORDER — ACYCLOVIR 400 MG PO TABS: 400.0000 mg | ORAL_TABLET | Freq: Every day | ORAL | 0 refills | Status: DC

## 2017-03-07 MED ORDER — PROPRANOLOL HCL 40 MG PO TABS: 40.0000 mg | ORAL_TABLET | Freq: Two times a day (BID) | ORAL | 0 refills | Status: DC

## 2017-03-22 ENCOUNTER — Other Ambulatory Visit: Payer: Self-pay | Admitting: Family Medicine

## 2017-04-18 DIAGNOSIS — G250 Essential tremor: Secondary | ICD-10-CM | POA: Insufficient documentation

## 2017-04-24 ENCOUNTER — Emergency Department (INDEPENDENT_AMBULATORY_CARE_PROVIDER_SITE_OTHER): Payer: Medicare HMO

## 2017-04-24 ENCOUNTER — Encounter: Payer: Self-pay | Admitting: Emergency Medicine

## 2017-04-24 ENCOUNTER — Emergency Department
Admission: EM | Admit: 2017-04-24 | Discharge: 2017-04-24 | Disposition: A | Discharge status: Home / Self Care | Payer: Medicare HMO | Source: Home / Self Care | Attending: Family Medicine | Admitting: Family Medicine

## 2017-04-24 DIAGNOSIS — M7989 Other specified soft tissue disorders: Secondary | ICD-10-CM

## 2017-04-24 DIAGNOSIS — M79661 Pain in right lower leg: Secondary | ICD-10-CM

## 2017-04-24 NOTE — ED Provider Notes (Signed)
[facility]    CSN: [id number] Arrival date & time: 04/24/17  1359     History   Chief Complaint Chief Complaint  Patient presents with  . Joint Swelling    HPI Nancy Wilson is a 76 y.o. female.   Patient complains of pain/swelling in her right lower leg and ankle for about one week.  She recalls no injury or recent change in activities.   The history is provided by the patient.  Leg Pain  Location:  Leg and ankle Time since incident:  1 week Injury: no   Leg location:  R lower leg Ankle location:  R ankle Pain details:    Quality:  Aching   Radiates to:  Does not radiate   Severity:  Mild   Onset quality:  Gradual   Duration:  1 week   Timing:  Constant   Progression:  Unchanged Chronicity:  New Prior injury to area:  No Relieved by:  None tried Worsened by:  Exercise and bearing weight Ineffective treatments:  None tried Associated symptoms: decreased ROM and swelling   Associated symptoms: no back pain, no fatigue, no fever, no itching, no muscle weakness, no numbness, no stiffness and no tingling     Past Medical History:  Diagnosis Date  . Allergy   . Depression    long standing  . Hyperlipidemia   . Hypertension     Patient Active Problem List   Diagnosis Date Noted  . Posterior vitreous detachment of left eye 05/20/2016  . Bilateral plantar fasciitis 08/09/2014  . Osteoarthritis of both ankles 08/09/2014  . BENIGN POSITIONAL VERTIGO 04/24/2010  . HYPERLIPIDEMIA 03/28/2009  . FAMILIAL TREMOR 03/28/2009  . ANXIETY DEPRESSION 11/20/2008  . HERPES GENITALIS 04/03/2008  . Hypothyroidism 04/03/2008  . Vitamin D deficiency 04/03/2008  . HYPERTENSION, BENIGN 04/03/2008  . GERD 04/03/2008  . HIATAL HERNIA 04/03/2008  . DIVERTICULOSIS OF COLON 04/03/2008  . CHRONIC KIDNEY DISEASE STAGE III (MODERATE) 04/03/2008  . OSTEOARTHRITIS 04/03/2008  . OSTEOPENIA 04/03/2008  . EDEMA 04/03/2008  . Migraine headache without aura 04/03/2008     Past Surgical History:  Procedure Laterality Date  . carpel tunnel release, both hands  1980's  . DILATION AND CURETTAGE OF UTERUS    . GASTROSCOPY  11-06  . trigger finger release, left finger    . TUBAL LIGATION     bilateral    OB History    No data available       Home Medications    Prior to Admission medications   Medication Sig Start Date End Date Taking? Authorizing Provider  acyclovir (ZOVIRAX) 400 MG tablet Take 1 tablet (400 mg total) by mouth at bedtime. 03/07/17   Agapito Games, MD  amLODipine (NORVASC) 10 MG tablet Take 1 tablet (10 mg total) by mouth daily. 12/02/16   Agapito Games, MD  atorvastatin (LIPITOR) 40 MG tablet Take 1 tablet (40 mg total) by mouth daily. 12/07/16   Agapito Games, MD  cholecalciferol (VITAMIN D) 1000 UNITS tablet Take 2,000 Units by mouth daily.    [provider]  eletriptan (RELPAX) 20 MG tablet Take 1 tablet (20 mg total) by mouth as needed for migraine or headache. May repeat in 2 hours if headache persists or recurs. 12/02/16   Agapito Games, MD  promethazine (PHENERGAN) 25 MG tablet Take 1 tablet (25 mg total) by mouth every 8 (eight) hours as needed for nausea or vomiting. 01/31/15   Agapito Games, MD  propranolol (INDERAL) 40 MG tablet Take 1 tablet (40 mg total) by mouth 2 (two) times daily. 03/07/17   Agapito GamesMetheney, Catherine D, MD  propranolol (INDERAL) 40 MG tablet Take 1 tablet (40 mg total) by mouth 2 (two) times daily. Due for follow up visit 03/22/17   Agapito GamesMetheney, Catherine D, MD    Family History Family History  Problem Relation Age of Onset  . Diabetes Mother   . Hyperlipidemia Mother   . Heart disease Mother 2221  . Hypertension Mother   . Diabetes Father   . Hyperlipidemia Father   . Tremor Father        hand- started in his 8050's  . Hypertension Father   . Tremor Cousin     Social History Social History  Substance Use Topics  . Smoking status: Never Smoker  . Smokeless  tobacco: Never Used  . Alcohol use No     Allergies   Codeine; Doxycycline; Guaifenesin; Meperidine hcl; Metronidazole; Morphine; Penicillins; and Propoxyphene   Review of Systems Review of Systems  Constitutional: Negative for fatigue and fever.  Respiratory: Negative for cough, chest tightness, shortness of breath, wheezing and stridor.   Musculoskeletal: Negative for back pain and stiffness.  Skin: Negative for itching.  All other systems reviewed and are negative.    Physical Exam Triage Vital Signs ED Triage Vitals  Enc Vitals Group     BP 04/24/17 1428 136/80     Pulse Rate 04/24/17 1428 76     Resp --      Temp 04/24/17 1428 98.4 F (36.9 C)     Temp Source 04/24/17 1428 Oral     SpO2 04/24/17 1428 98 %     Weight 04/24/17 1429 160 lb (72.6 kg)     Height --      Head Circumference --      Peak Flow --      Pain Score 04/24/17 1429 0     Pain Loc --      Pain Edu? --      Excl. in GC? --    No data found.   Updated Vital Signs BP 136/80 (BP Location: Right Arm)   Pulse 76   Temp 98.4 F (36.9 C) (Oral)   Wt 160 lb (72.6 kg)   SpO2 98%   BMI 31.25 kg/m   Visual Acuity Right Eye Distance:   Left Eye Distance:   Bilateral Distance:    Right Eye Near:   Left Eye Near:    Bilateral Near:     Physical Exam  Constitutional: She appears well-developed and well-nourished. No distress.  HENT:  Head: Normocephalic.  Nose: Nose normal.  Eyes: Pupils are equal, round, and reactive to light. Conjunctivae are normal.  Neck: Neck supple.  Cardiovascular: Normal heart sounds.   Pulses:      Dorsalis pedis pulses are 2+ on the right side.  Pulmonary/Chest: Breath sounds normal.  Abdominal: There is no tenderness.  Musculoskeletal:       Legs: Distinct tenderness to palpation right lower leg bilateral anterior compartments.  Pain elicited during resisted dorsiflexion of right foot while palpating anterior leg.  No posterior calf tenderness.    Lymphadenopathy:    She has no cervical adenopathy.  Neurological: She is alert.  Skin: Skin is warm and dry. Capillary refill takes 2 to 3 seconds. No rash noted.  Nursing note and vitals reviewed.    UC Treatments / Results  Labs (all labs ordered are listed, but only abnormal  results are displayed) Labs Reviewed - No data to display  EKG  EKG Interpretation None       Radiology Dg Tibia/fibula Right  Result Date: 04/24/2017 CLINICAL DATA:  Right lower leg swelling.  No known injury. EXAM: RIGHT TIBIA AND FIBULA - 2 VIEW COMPARISON:  None. FINDINGS: There is no evidence of fracture or other focal bone lesions. Soft tissues are unremarkable. IMPRESSION: Negative. Electronically Signed   By: Bary RichardStan  Maynard M.D.   On: 04/24/2017 15:52    Procedures Procedures (including critical care time)  Medications Ordered in UC Medications - No data to display   Initial Impression / Assessment and Plan / UC Course  I have reviewed the triage vital signs and the nursing notes.  Pertinent labs & imaging results that were available during my care of the patient were reviewed by me and considered in my medical decision making (see chart for details).    Suspect shin splints. Recommend well-fitting shoe arch supports.  Begin range of motion and stretching exercises as tolerated. Followup with Dr. Rodney Langtonhomas Thekkekandam or Dr. Clementeen GrahamEvan Corey ([facility]) if not improving about two       Final Clinical Impressions(s) / UC Diagnoses   Final diagnoses:  Pain and swelling of right lower leg    New Prescriptions New Prescriptions   No medications on file     Lattie HawBeese, Courtland Coppa A, MD 05/06/17 1556

## 2017-04-24 NOTE — Discharge Instructions (Signed)
Recommend well-fitting shoe arch supports.  Begin range of motion and stretching exercises as tolerated.

## 2017-04-24 NOTE — ED Triage Notes (Signed)
Pt c/o right leg and ankle swelling x1 week. States she saw her pcp for this last week and she decreased her amlodipine to see if this was related. Pt states swelling has worsened and only in right leg./

## 2017-05-30 DIAGNOSIS — N3281 Overactive bladder: Secondary | ICD-10-CM | POA: Insufficient documentation

## 2017-06-01 ENCOUNTER — Other Ambulatory Visit: Payer: Self-pay | Admitting: Family Medicine

## 2017-09-26 ENCOUNTER — Emergency Department
Admission: EM | Admit: 2017-09-26 | Discharge: 2017-09-26 | Disposition: A | Discharge status: Home / Self Care | Payer: Medicare HMO | Source: Home / Self Care | Attending: Family Medicine | Admitting: Family Medicine

## 2017-09-26 ENCOUNTER — Other Ambulatory Visit: Payer: Self-pay

## 2017-09-26 ENCOUNTER — Emergency Department (INDEPENDENT_AMBULATORY_CARE_PROVIDER_SITE_OTHER): Payer: Medicare HMO

## 2017-09-26 ENCOUNTER — Encounter: Payer: Self-pay | Admitting: *Deleted

## 2017-09-26 DIAGNOSIS — M25532 Pain in left wrist: Secondary | ICD-10-CM

## 2017-09-26 DIAGNOSIS — S52532A Colles' fracture of left radius, initial encounter for closed fracture: Secondary | ICD-10-CM | POA: Diagnosis not present

## 2017-09-26 NOTE — ED Provider Notes (Signed)
[facility]    CSN: [id number] Arrival date & time: 09/26/17  1337     History   Chief Complaint Chief Complaint  Patient presents with  . Wrist Pain    HPI Nancy Wilson is a 77 y.o. female.   HPI Nancy Wilson is a 77 y.o. female presenting to UC with c/o Left wrist pain and swelling that started last night after she tripped on some cement, falling on outstretched hands.  Pain is aching and sore, 7/10, worse with trying to grasp objects.  Pain is managed by OTC Tylenol.  She is also c/o Left elbow bruise but minimal pain. No other injuries from the fall.  She is not on blood thinners.  She is Right hand dominant.  He of fracture in same wrist in 2016.  Per pt it needed to be reduced but no hardware was needed to keep it in place. No lasting side effects from prior fracture.    Past Medical History:  Diagnosis Date  . Allergy   . Depression    long standing  . Hyperlipidemia   . Hypertension     Patient Active Problem List   Diagnosis Date Noted  . Posterior vitreous detachment of left eye 05/20/2016  . Bilateral plantar fasciitis 08/09/2014  . Osteoarthritis of both ankles 08/09/2014  . BENIGN POSITIONAL VERTIGO 04/24/2010  . HYPERLIPIDEMIA 03/28/2009  . FAMILIAL TREMOR 03/28/2009  . ANXIETY DEPRESSION 11/20/2008  . HERPES GENITALIS 04/03/2008  . Hypothyroidism 04/03/2008  . Vitamin D deficiency 04/03/2008  . HYPERTENSION, BENIGN 04/03/2008  . GERD 04/03/2008  . HIATAL HERNIA 04/03/2008  . DIVERTICULOSIS OF COLON 04/03/2008  . CHRONIC KIDNEY DISEASE STAGE III (MODERATE) 04/03/2008  . OSTEOARTHRITIS 04/03/2008  . OSTEOPENIA 04/03/2008  . EDEMA 04/03/2008  . Migraine headache without aura 04/03/2008    Past Surgical History:  Procedure Laterality Date  . carpel tunnel release, both hands  1980's  . DILATION AND CURETTAGE OF UTERUS    . GASTROSCOPY  11-06  . trigger finger release, left finger    . TUBAL LIGATION     bilateral    OB History     No data available       Home Medications    Prior to Admission medications   Medication Sig Start Date End Date Taking? Authorizing Provider  acyclovir (ZOVIRAX) 400 MG tablet TAKE 1 TABLET (400 MG TOTAL) BY MOUTH AT BEDTIME. 06/01/17   Agapito Games, MD  amLODipine (NORVASC) 10 MG tablet Take 1 tablet (10 mg total) by mouth daily. 12/02/16   Agapito Games, MD  atorvastatin (LIPITOR) 40 MG tablet Take 1 tablet (40 mg total) by mouth daily. 12/07/16   Agapito Games, MD  cholecalciferol (VITAMIN D) 1000 UNITS tablet Take 2,000 Units by mouth daily.    [provider]  eletriptan (RELPAX) 20 MG tablet Take 1 tablet (20 mg total) by mouth as needed for migraine or headache. May repeat in 2 hours if headache persists or recurs. 12/02/16   Agapito Games, MD  promethazine (PHENERGAN) 25 MG tablet Take 1 tablet (25 mg total) by mouth every 8 (eight) hours as needed for nausea or vomiting. 01/31/15   Agapito Games, MD  propranolol (INDERAL) 40 MG tablet Take 1 tablet (40 mg total) by mouth 2 (two) times daily. Due for follow up visit 03/22/17   Agapito Games, MD    Family History Family History  Problem Relation Age of Onset  . Diabetes  Mother   . Hyperlipidemia Mother   . Heart disease Mother 6721  . Hypertension Mother   . Diabetes Father   . Hyperlipidemia Father   . Tremor Father        hand- started in his 5550's  . Hypertension Father   . Tremor Cousin     Social History Social History   Tobacco Use  . Smoking status: Never Smoker  . Smokeless tobacco: Never Used  Substance Use Topics  . Alcohol use: No    Alcohol/week: 0.0 oz  . Drug use: No     Allergies   Codeine; Doxycycline; Guaifenesin; Meperidine hcl; Metronidazole; Morphine; Penicillins; and Propoxyphene   Review of Systems Review of Systems  Musculoskeletal: Positive for arthralgias, joint swelling and myalgias. Negative for back pain, gait problem, neck pain  and neck stiffness.  Skin: Positive for color change. Negative for rash and wound.  Neurological: Positive for weakness. Negative for numbness.     Physical Exam Triage Vital Signs ED Triage Vitals [09/26/17 1422]  Enc Vitals Group     BP (!) 143/84     Pulse Rate 73     Resp 14     Temp      Temp src      SpO2 98 %     Weight 155 lb (70.3 kg)     Height      Head Circumference      Peak Flow      Pain Score 7     Pain Loc      Pain Edu?      Excl. in GC?    No data found.  Updated Vital Signs BP (!) 143/84 (BP Location: Right Arm)   Pulse 73   Resp 14   Wt 155 lb (70.3 kg)   SpO2 98%   BMI 30.27 kg/m   Visual Acuity Right Eye Distance:   Left Eye Distance:   Bilateral Distance:    Right Eye Near:   Left Eye Near:    Bilateral Near:     Physical Exam  Constitutional: She is oriented to person, place, and time. She appears well-developed and well-nourished. No distress.  HENT:  Head: Normocephalic and atraumatic.  Eyes: EOM are normal.  Neck: Normal range of motion.  Cardiovascular: Normal rate.  Pulmonary/Chest: Effort normal.  Musculoskeletal: Normal range of motion. She exhibits edema and tenderness.  Left wrist and hand: mild edema. Full ROM, tenderness along radial aspect of wrist. No snuffbox tenderness. 4/5 grip strength compared to Right. Left shoulder and elbow: no bony tenderness. Full ROM.   Neurological: She is alert and oriented to person, place, and time.  Skin: Skin is warm and dry. Capillary refill takes less than 2 seconds. She is not diaphoretic.  Left wrist: mild ecchymosis to volar aspect of wrist. Skin in tact. Left elbow: dime-sized area of ecchymosis proximal to olecranon.   Psychiatric: She has a normal mood and affect. Her behavior is normal.  Nursing note and vitals reviewed.    UC Treatments / Results  Labs (all labs ordered are listed, but only abnormal results are displayed) Labs Reviewed - No data to display  EKG  EKG  Interpretation None       Radiology Dg Wrist Complete Left  Result Date: 09/26/2017 CLINICAL DATA:  Left wrist pain since a fall the night of 09/25/2017. Initial encounter. EXAM: LEFT WRIST - COMPLETE 3+ VIEW COMPARISON:  None. FINDINGS: There is soft tissue swelling about the wrist. Subtle  cortical defect is seen at the base of the radial styloid compatible with a nondisplaced fracture. Bones are osteopenic. First CMC and scaphoid trapezium trapezoid joint osteoarthritis is noted. IMPRESSION: Findings worrisome for a nondisplaced fracture at the base of the radial styloid. Associated soft tissue swelling is noted. Electronically Signed   By: Drusilla Kanner M.D.   On: 09/26/2017 15:16    Procedures Splint Application Date/Time: 09/26/2017 3:59 PM Performed by: Lurene Shadow, PA-C Authorized by: Lattie Haw, MD   Consent:    Consent obtained:  Verbal   Consent given by:  Patient   Risks discussed:  Discoloration, numbness, pain and swelling   Alternatives discussed:  Delayed treatment and no treatment Pre-procedure details:    Sensation:  Normal   Skin color:  Pink, dry Procedure details:    Laterality:  Left   Location:  Wrist   Wrist:  L wrist   Cast type:  Short arm   Splint type:  Thumb spica   Supplies:  Elastic bandage, Ortho-Glass and cotton padding Post-procedure details:    Pain:  Unchanged   Sensation:  Normal   Skin color:  Pink and dry   Patient tolerance of procedure:  Tolerated well, no immediate complications   (including critical care time)  Medications Ordered in UC Medications - No data to display   Initial Impression / Assessment and Plan / UC Course  I have reviewed the triage vital signs and the nursing notes.  Pertinent labs & imaging results that were available during my care of the patient were reviewed by me and considered in my medical decision making (see chart for details).     Custom thumb-spica splint applied. Home care  instructions provided. F/u with Sports Medicine in 1 week for recheck of symptoms and likely splint change.   Final Clinical Impressions(s) / UC Diagnoses   Final diagnoses:  Closed Colles' fracture of left radius, initial encounter    ED Discharge Orders    None       Controlled Substance Prescriptions [place] Controlled Substance Registry consulted? Not Applicable   Lurene Shadow, PA-C 09/26/17 1600

## 2017-09-26 NOTE — ED Triage Notes (Signed)
Patient reports falling on cement last night. C/o left wrist and left upper arm pain. Bruise noted above left elbow. Previous fracture to left wrist in 2016. Taking tylenol.

## 2017-09-29 ENCOUNTER — Telehealth: Payer: Self-pay | Admitting: *Deleted

## 2017-09-29 NOTE — Telephone Encounter (Signed)
Callback: Patient reports she is having some pain and taking tylenol. She has a f/u apt with sports med in 5 days.

## 2018-05-12 ENCOUNTER — Emergency Department: Payer: Medicare HMO

## 2018-05-12 ENCOUNTER — Encounter: Payer: Self-pay | Admitting: *Deleted

## 2018-05-12 ENCOUNTER — Emergency Department
Admission: EM | Admit: 2018-05-12 | Discharge: 2018-05-12 | Disposition: A | Discharge status: Home / Self Care | Payer: Medicare HMO | Source: Home / Self Care | Attending: Family Medicine | Admitting: Family Medicine

## 2018-05-12 ENCOUNTER — Emergency Department (INDEPENDENT_AMBULATORY_CARE_PROVIDER_SITE_OTHER): Payer: Medicare HMO

## 2018-05-12 DIAGNOSIS — W010XXA Fall on same level from slipping, tripping and stumbling without subsequent striking against object, initial encounter: Secondary | ICD-10-CM | POA: Diagnosis not present

## 2018-05-12 DIAGNOSIS — M25561 Pain in right knee: Secondary | ICD-10-CM | POA: Diagnosis not present

## 2018-05-12 DIAGNOSIS — S8991XA Unspecified injury of right lower leg, initial encounter: Secondary | ICD-10-CM | POA: Diagnosis not present

## 2018-05-12 MED ORDER — KETOROLAC TROMETHAMINE 30 MG/ML IJ SOLN
30.0000 mg | Freq: Once | INTRAMUSCULAR | Status: AC
  Administered 2018-05-12: 30 mg via INTRAMUSCULAR

## 2018-05-12 NOTE — ED Provider Notes (Signed)
[facility]    CSN: [id number] Arrival date & time: 05/12/18  1515     History   Chief Complaint Chief Complaint  Patient presents with  . Knee Injury  . Fall    HPI Nancy Wilson is a 77 y.o. female.   HPI  Nancy Wilson is a 77 y.o. female presenting to UC with c/o Right knee pain after trip and fall at home earlier today. She landed on hardwood. Denies hitting or head or other injuries. Knee pain is moderate to severe on lateral aspect, worse with weightbearing and walking.  She has not taken any medication because she would like a shot from here.  No hx of prior Right knee injury.   Past Medical History:  Diagnosis Date  . Allergy   . Depression    long standing  . Hyperlipidemia   . Hypertension     Patient Active Problem List   Diagnosis Date Noted  . Posterior vitreous detachment of left eye 05/20/2016  . Bilateral plantar fasciitis 08/09/2014  . Osteoarthritis of both ankles 08/09/2014  . BENIGN POSITIONAL VERTIGO 04/24/2010  . HYPERLIPIDEMIA 03/28/2009  . FAMILIAL TREMOR 03/28/2009  . ANXIETY DEPRESSION 11/20/2008  . HERPES GENITALIS 04/03/2008  . Hypothyroidism 04/03/2008  . Vitamin D deficiency 04/03/2008  . HYPERTENSION, BENIGN 04/03/2008  . GERD 04/03/2008  . HIATAL HERNIA 04/03/2008  . DIVERTICULOSIS OF COLON 04/03/2008  . CHRONIC KIDNEY DISEASE STAGE III (MODERATE) 04/03/2008  . OSTEOARTHRITIS 04/03/2008  . OSTEOPENIA 04/03/2008  . EDEMA 04/03/2008  . Migraine headache without aura 04/03/2008    Past Surgical History:  Procedure Laterality Date  . carpel tunnel release, both hands  1980's  . DILATION AND CURETTAGE OF UTERUS    . GASTROSCOPY  11-06  . trigger finger release, left finger    . TUBAL LIGATION     bilateral    OB History   None      Home Medications    Prior to Admission medications   Medication Sig Start Date End Date Taking? Authorizing Provider  acyclovir (ZOVIRAX) 400 MG tablet TAKE 1 TABLET (400  MG TOTAL) BY MOUTH AT BEDTIME. 06/01/17  Yes Agapito GamesMetheney, Catherine D, MD  amLODipine (NORVASC) 10 MG tablet Take 1 tablet (10 mg total) by mouth daily. 12/02/16  Yes Agapito GamesMetheney, Catherine D, MD  atorvastatin (LIPITOR) 40 MG tablet Take 1 tablet (40 mg total) by mouth daily. 12/07/16  Yes Agapito GamesMetheney, Catherine D, MD  buPROPion (WELLBUTRIN XL) 300 MG 24 hr tablet Take 300 mg by mouth daily.   Yes [provider]  oxybutynin (DITROPAN-XL) 10 MG 24 hr tablet Take 10 mg by mouth at bedtime.   Yes [provider]  propranolol (INDERAL) 40 MG tablet Take 1 tablet (40 mg total) by mouth 2 (two) times daily. Due for follow up visit 03/22/17  Yes Agapito GamesMetheney, Catherine D, MD  sertraline (ZOLOFT) 25 MG tablet Take 25 mg by mouth daily.   Yes [provider]  cholecalciferol (VITAMIN D) 1000 UNITS tablet Take 2,000 Units by mouth daily.    [provider]  eletriptan (RELPAX) 20 MG tablet Take 1 tablet (20 mg total) by mouth as needed for migraine or headache. May repeat in 2 hours if headache persists or recurs. 12/02/16   Agapito GamesMetheney, Catherine D, MD  promethazine (PHENERGAN) 25 MG tablet Take 1 tablet (25 mg total) by mouth every 8 (eight) hours as needed for nausea or vomiting. 01/31/15   Agapito GamesMetheney, Catherine D, MD  Family History Family History  Problem Relation Age of Onset  . Diabetes Mother   . Hyperlipidemia Mother   . Heart disease Mother 51  . Hypertension Mother   . Diabetes Father   . Hyperlipidemia Father   . Tremor Father        hand- started in his 35's  . Hypertension Father   . Tremor Cousin     Social History Social History   Tobacco Use  . Smoking status: Never Smoker  . Smokeless tobacco: Never Used  Substance Use Topics  . Alcohol use: No    Alcohol/week: 0.0 standard drinks  . Drug use: No     Allergies   Codeine; Doxycycline; Guaifenesin; Meperidine hcl; Metronidazole; Morphine; Penicillins; and Propoxyphene   Review of Systems Review of  Systems  Musculoskeletal: Positive for arthralgias and joint swelling. Negative for myalgias.  Skin: Negative for color change and wound.     Physical Exam Triage Vital Signs ED Triage Vitals [05/12/18 1547]  Enc Vitals Group     BP (!) 175/86     Pulse Rate 87     Resp      Temp      Temp src      SpO2 97 %     Weight      Height      Head Circumference      Peak Flow      Pain Score 9     Pain Loc      Pain Edu?      Excl. in GC?    No data found.  Updated Vital Signs BP (!) 175/86 (BP Location: Left Arm)   Pulse 87   SpO2 97%   Visual Acuity Right Eye Distance:   Left Eye Distance:   Bilateral Distance:    Right Eye Near:   Left Eye Near:    Bilateral Near:     Physical Exam  Constitutional: She is oriented to person, place, and time. She appears well-developed and well-nourished.  HENT:  Head: Normocephalic and atraumatic.  Eyes: EOM are normal.  Neck: Normal range of motion.  Cardiovascular: Normal rate.  Pulmonary/Chest: Effort normal.  Musculoskeletal: She exhibits edema and tenderness.  Right knee: mild edema. No obvious deformity. Slight decreased flexion due to pain. Tenderness to lateral aspect.  Calf is soft, non-tender.   Neurological: She is alert and oriented to person, place, and time.  Skin: Skin is warm and dry.  Right knee: skin in tact. No ecchymosis or erythema.   Psychiatric: She has a normal mood and affect. Her behavior is normal.  Nursing note and vitals reviewed.    UC Treatments / Results  Labs (all labs ordered are listed, but only abnormal results are displayed) Labs Reviewed - No data to display  EKG None  Radiology Dg Knee Complete 4 Views Right  Result Date: 05/12/2018 CLINICAL DATA:  Fall today.  Lateral knee pain. EXAM: RIGHT KNEE - COMPLETE 4+ VIEW COMPARISON:  None. FINDINGS: Knee is located. Mild degenerative changes are noted. Prepatellar soft tissue swelling is evident. Soft tissues are otherwise  unremarkable. There is no significant effusion. No acute fractures are present. IMPRESSION: 1. Prepatellar soft tissue swelling without underlying fracture. Electronically Signed   By: Marin Roberts M.D.   On: 05/12/2018 16:53    Procedures Procedures (including critical care time)  Medications Ordered in UC Medications  ketorolac (TORADOL) 30 MG/ML injection 30 mg (30 mg Intramuscular Given 05/12/18 1550)    Initial  Impression / Assessment and Plan / UC Course  I have reviewed the triage vital signs and the nursing notes.  Pertinent labs & imaging results that were available during my care of the patient were reviewed by me and considered in my medical decision making (see chart for details).     Discussed imaging with pt. Will treat as a sprain  Knee sleeve provided for comfort Encouraged f/u with PCP or Sports Medicine next week if needed.  Final Clinical Impressions(s) / UC Diagnoses   Final diagnoses:  Right knee injury, initial encounter  Fall from slip, trip, or stumble, initial encounter     Discharge Instructions      You may use the knee sleeve for added comfort and support. Apply a cool compress 2-3 times daily for the next few days. If not improving, please follow up with Family Medicine or Sports Medicine in 1 week.     ED Prescriptions    None     Controlled Substance Prescriptions [place] Controlled Substance Registry consulted? Not Applicable   Rolla Plate 05/12/18 3086

## 2018-05-12 NOTE — ED Triage Notes (Signed)
Patient reports losing her balance and falling this afternoon, landing on her right knee. C/o lateral knee pain. No meds or ice used at home.

## 2018-05-12 NOTE — Discharge Instructions (Signed)
°  You may use the knee sleeve for added comfort and support. Apply a cool compress 2-3 times daily for the next few days. If not improving, please follow up with Family Medicine or Sports Medicine in 1 week.

## 2018-06-20 NOTE — Progress Notes (Signed)
Subjective:   Nancy Wilson is a 77 y.o. female who presents for an Initial Medicare Annual Wellness Visit.  Review of Systems    No ROS.  Medicare Wellness Visit. Additional risk factors are reflected in the social history.  Cardiac Risk Factors include: dyslipidemia;hypertension;sedentary lifestyle;advanced age (>61men, >57 women) Sleep patterns: tries to get 8 hours a night. Wakes up 4 times during night to void.Wakes up feels rested.    Home Safety/Smoke Alarms: Feels safe in home. Smoke alarms in place.  Living environment; Lives in 1 story home with husband and downs syndrome daughter who is 30. Steps have handrails in place. Shower is a walk in shower with grab rails in place.    Female:   Pap-  Aged out     Mammo-  Has appointment in November     Dexa scan- utd       CCS- patient reports 10 years ago     Objective:    There were no vitals filed for this visit. There is no height or weight on file to calculate BMI.  Advanced Directives 07/03/2018  Does Patient Have a Medical Advance Directive? Yes  Type of Estate agent of [place];Living will  Does patient want to make changes to medical advance directive? Yes (MAU/Ambulatory/Procedural Areas - Information given)  Copy of Healthcare Power of Attorney in Chart? No - copy requested  Would patient like information on creating a medical advance directive? No - Patient declined    Current Medications (verified) Outpatient Encounter Medications as of 07/03/2018  Medication Sig  . acyclovir (ZOVIRAX) 400 MG tablet TAKE 1 TABLET (400 MG TOTAL) BY MOUTH AT BEDTIME.  Marland Kitchen amLODipine (NORVASC) 10 MG tablet Take 1 tablet (10 mg total) by mouth daily.  Marland Kitchen atorvastatin (LIPITOR) 40 MG tablet Take 1 tablet (40 mg total) by mouth daily.  Marland Kitchen buPROPion (WELLBUTRIN XL) 300 MG 24 hr tablet Take 300 mg by mouth daily.  . cholecalciferol (VITAMIN D) 1000 UNITS tablet Take 2,000 Units by mouth daily.  Marland Kitchen oxybutynin  (DITROPAN-XL) 10 MG 24 hr tablet Take 10 mg by mouth at bedtime.  . promethazine (PHENERGAN) 25 MG tablet Take 1 tablet (25 mg total) by mouth every 8 (eight) hours as needed for nausea or vomiting.  . propranolol (INDERAL) 40 MG tablet Take 1 tablet (40 mg total) by mouth 2 (two) times daily. Due for follow up visit  . sertraline (ZOLOFT) 25 MG tablet Take 25 mg by mouth daily.  Marland Kitchen eletriptan (RELPAX) 20 MG tablet Take 1 tablet (20 mg total) by mouth as needed for migraine or headache. May repeat in 2 hours if headache persists or recurs. (Patient not taking: Reported on 07/03/2018)   No facility-administered encounter medications on file as of 07/03/2018.     Allergies (verified) Codeine; Doxycycline; Guaifenesin; Meperidine hcl; Metronidazole; Morphine; Penicillins; and Propoxyphene   History: Past Medical History:  Diagnosis Date  . Allergy   . Depression    long standing  . Hyperlipidemia   . Hypertension    Past Surgical History:  Procedure Laterality Date  . carpel tunnel release, both hands  1980's  . DILATION AND CURETTAGE OF UTERUS    . GASTROSCOPY  11-06  . trigger finger release, left finger    . TUBAL LIGATION     bilateral   Family History  Problem Relation Age of Onset  . Diabetes Mother   . Hyperlipidemia Mother   . Heart disease Mother 46  . Hypertension  Mother   . Diabetes Father   . Hyperlipidemia Father   . Tremor Father        hand- started in his 65's  . Hypertension Father   . Tremor Cousin    Social History   Socioeconomic History  . Marital status: Married    Spouse name: Geographical information systems officer  . Number of children: 2  . Years of education: 9th  . Highest education level: Not on file  Occupational History  . Occupation: Housewife  Social Needs  . Financial resource strain: Not hard at all  . Food insecurity:    Worry: Never true    Inability: Never true  . Transportation needs:    Medical: No    Non-medical: No  Tobacco Use  . Smoking status:  Never Smoker  . Smokeless tobacco: Never Used  Substance and Sexual Activity  . Alcohol use: No    Alcohol/week: 0.0 standard drinks  . Drug use: No  . Sexual activity: Not Currently  Lifestyle  . Physical activity:    Days per week: 0 days    Minutes per session: 0 min  . Stress: Only a little  Relationships  . Social connections:    Talks on phone: More than three times a week    Gets together: Once a week    Attends religious service: Never    Active member of club or organization: No    Attends meetings of clubs or organizations: Never    Relationship status: Married  Other Topics Concern  . Not on file  Social History Narrative   No caffeine use.    Tobacco Counseling Counseling given: Not Answered   Clinical Intake:  Pre-visit preparation completed: Yes  Pain : No/denies pain     Nutritional Risks: None Diabetes: No  How often do you need to have someone help you when you read instructions, pamphlets, or other written materials from your doctor or pharmacy?: 1 - Never What is the last grade level you completed in school?: 9th  Interpreter Needed?: No  Information entered by :: Nancy Sicks, LPN   Activities of Daily Living In your present state of health, do you have any difficulty performing the following activities: 07/03/2018  Hearing? N  Vision? Y  Difficulty concentrating or making decisions? N  Walking or climbing stairs? N  Dressing or bathing? N  Doing errands, shopping? N  Preparing Food and eating ? N  Using the Toilet? N  In the past six months, have you accidently leaked urine? N  Do you have problems with loss of bowel control? N  Managing your Medications? N  Managing your Finances? N  Housekeeping or managing your Housekeeping? N  Some recent data might be hidden     Immunizations and Health Maintenance Immunization History  Administered Date(s) Administered  . Influenza Whole 06/24/2009  . Influenza, High Dose Seasonal PF  08/23/2011, 05/02/2015  . Influenza-Unspecified 07/25/2014  . Pneumococcal Conjugate-13 09/22/2009, 08/13/2014  . Pneumococcal Polysaccharide-23 07/21/2008  . Td 09/21/2003  . Tdap 05/02/2015   Health Maintenance Due  Topic Date Due  . HEMOGLOBIN A1C  March 16, 1941  . FOOT EXAM  09/30/1951  . OPHTHALMOLOGY EXAM  09/30/1951  . URINE MICROALBUMIN  09/30/1951  . INFLUENZA VACCINE  04/20/2018    Patient Care Team: Agapito Games, MD as PCP - General  Indicate any recent Medical Services you may have received from other than [facility] providers in the past year (date may be approximate).  Assessment:   This is a routine wellness examination for [facility]. Physical assessment deferred to PCP.   Hearing/Vision screen  Visual Acuity Screening   Right eye Left eye Both eyes  Without correction: 20/30 20/50 20/25   With correction:     Comments: Pt needs cataracts taken off. Sees Dr. Norton Blizzard  Hearing Screening Comments: Whisper test done- patient repeated ack all 3 words  Dietary issues and exercise activities discussed: Current Exercise Habits: The patient does not participate in regular exercise at present, Exercise limited by: orthopedic condition(s) Diet- Eats healthy diet with fruits and vegetables.  Breakfast:cereal Lunch: sandwich Dinner: meat and vegetables    Drinks a half gallon of water daily.   Goals    . Exercise 3x per week (30 min per time)     Start exercise 3 times a week for 30 minutes a day. To help decompress from stress that day. Continue gardening      Depression Screen PHQ 2/9 Scores 07/03/2018 08/13/2014 08/13/2014  PHQ - 2 Score 1 1 1   PHQ- 9 Score - 1 -    Fall Risk Fall Risk  07/03/2018 08/13/2014  Falls in the past year? Yes Yes  Number falls in past yr: 2 or more 2 or more  Injury with Fall? No -  Risk for fall due to : Impaired balance/gait Impaired balance/gait  Follow up Falls prevention discussed -    Is the patient's home free of  loose throw rugs in walkways, pet beds, electrical cords, etc?   yes      Grab bars in the bathroom? yes      Handrails on the stairs?   yes      Adequate lighting?   yes   Cognitive Function:     6CIT Screen 07/03/2018  What Year? 4 points  What month? 0 points  What time? 0 points  Count back from 20 0 points  Months in reverse 2 points  Repeat phrase 0 points  Total Score 6    Screening Tests Health Maintenance  Topic Date Due  . HEMOGLOBIN A1C  1941-09-20  . FOOT EXAM  09/30/1951  . OPHTHALMOLOGY EXAM  09/30/1951  . URINE MICROALBUMIN  09/30/1951  . INFLUENZA VACCINE  04/20/2018  . TETANUS/TDAP  05/01/2025  . DEXA SCAN  Completed  . PNA vac Low Risk Adult  Completed      Plan:  Please schedule your next medicare wellness visit with me in 1 yr.  Ms. Heenan , Thank you for taking time to come for your Medicare Wellness Visit. I appreciate your ongoing commitment to your health goals. Please review the following plan we discussed and let me know if I can assist you in the future.   These are the goals we discussed: Goals    . Exercise 3x per week (30 min per time)     Start exercise 3 times a week for 30 minutes a day. To help decompress from stress that day. Continue gardening       This is a list of the screening recommended for you and due dates:  Health Maintenance  Topic Date Due  . Hemoglobin A1C  07/03/1941  . Complete foot exam   09/30/1951  . Eye exam for diabetics  09/30/1951  . Urine Protein Check  09/30/1951  . Flu Shot  04/20/2018  . Tetanus Vaccine  05/01/2025  . DEXA scan (bone density measurement)  Completed  . Pneumonia vaccines  Completed   Continue doing brain stimulating  activities (puzzles, reading, adult coloring books, staying active) to keep memory sharp.  Bring a copy of your living will and/or healthcare power of attorney to your next office visit.   I have personally reviewed and noted the following in the patient's chart:    . Medical and social history . Use of alcohol, tobacco or illicit drugs  . Current medications and supplements . Functional ability and status . Nutritional status . Physical activity . Advanced directives . List of other physicians . Hospitalizations, surgeries, and ER visits in previous 12 months . Vitals . Screenings to include cognitive, depression, and falls . Referrals and appointments  In addition, I have reviewed and discussed with patient certain preventive protocols, quality metrics, and best practice recommendations. A written personalized care plan for preventive services as well as general preventive health recommendations were provided to patient.     Normand Sloop, LPN   16/06/9603

## 2018-07-03 ENCOUNTER — Ambulatory Visit (INDEPENDENT_AMBULATORY_CARE_PROVIDER_SITE_OTHER): Payer: Medicare HMO | Admitting: *Deleted

## 2018-07-03 VITALS — BP 130/68 | HR 96 | Ht 60.0 in | Wt 138.0 lb

## 2018-07-03 DIAGNOSIS — Z23 Encounter for immunization: Secondary | ICD-10-CM | POA: Diagnosis not present

## 2018-07-03 DIAGNOSIS — Z0000 Encounter for general adult medical examination without abnormal findings: Secondary | ICD-10-CM

## 2018-07-03 NOTE — Patient Instructions (Signed)
Please schedule your next medicare wellness visit with me in 1 yr.  Nancy Wilson , Thank you for taking time to come for your Medicare Wellness Visit. I appreciate your ongoing commitment to your health goals. Please review the following plan we discussed and let me know if I can assist you in the future.   These are the goals we discussed: Goals    . Exercise 3x per week (30 min per time)     Start exercise 3 times a week for 30 minutes a day. To help decompress from stress that day. Continue gardening    Continue doing brain stimulating activities (puzzles, reading, adult coloring books, staying active) to keep memory sharp.  Bring a copy of your living will and/or healthcare power of attorney to your next office visit.   Stress and Stress Management Stress is a normal reaction to life events. It is what you feel when life demands more than you are used to or more than you can handle. Some stress can be useful. For example, the stress reaction can help you catch the last bus of the day, study for a test, or meet a deadline at work. But stress that occurs too often or for too long can cause problems. It can affect your emotional health and interfere with relationships and normal daily activities. Too much stress can weaken your immune system and increase your risk for physical illness. If you already have a medical problem, stress can make it worse. What are the causes? All sorts of life events may cause stress. An event that causes stress for one person may not be stressful for another person. Major life events commonly cause stress. These may be positive or negative. Examples include losing your job, moving into a new home, getting married, having a baby, or losing a loved one. Less obvious life events may also cause stress, especially if they occur day after day or in combination. Examples include working long hours, driving in traffic, caring for children, being in debt, or being in a difficult  relationship. What are the signs or symptoms? Stress may cause emotional symptoms including, the following:  Anxiety. This is feeling worried, afraid, on edge, overwhelmed, or out of control.  Anger. This is feeling irritated or impatient.  Depression. This is feeling sad, down, helpless, or guilty.  Difficulty focusing, remembering, or making decisions.  Stress may cause physical symptoms, including the following:  Aches and pains. These may affect your head, neck, back, stomach, or other areas of your body.  Tight muscles or clenched jaw.  Low energy or trouble sleeping.  Stress may cause unhealthy behaviors, including the following:  Eating to feel better (overeating) or skipping meals.  Sleeping too little, too much, or both.  Working too much or putting off tasks (procrastination).  Smoking, drinking alcohol, or using drugs to feel better.  How is this diagnosed? Stress is diagnosed through an assessment by your health care provider. Your health care provider will ask questions about your symptoms and any stressful life events.Your health care provider will also ask about your medical history and may order blood tests or other tests. Certain medical conditions and medicine can cause physical symptoms similar to stress. Mental illness can cause emotional symptoms and unhealthy behaviors similar to stress. Your health care provider may refer you to a mental health professional for further evaluation. How is this treated? Stress management is the recommended treatment for stress.The goals of stress management are reducing stressful life events  and coping with stress in healthy ways. Techniques for reducing stressful life events include the following:  Stress identification. Self-monitor for stress and identify what causes stress for you. These skills may help you to avoid some stressful events.  Time management. Set your priorities, keep a calendar of events, and learn to  say "no." These tools can help you avoid making too many commitments.  Techniques for coping with stress include the following:  Rethinking the problem. Try to think realistically about stressful events rather than ignoring them or overreacting. Try to find the positives in a stressful situation rather than focusing on the negatives.  Exercise. Physical exercise can release both physical and emotional tension. The key is to find a form of exercise you enjoy and do it regularly.  Relaxation techniques. These relax the body and mind. Examples include yoga, meditation, tai chi, biofeedback, deep breathing, progressive muscle relaxation, listening to music, being out in nature, journaling, and other hobbies. Again, the key is to find one or more that you enjoy and can do regularly.  Healthy lifestyle. Eat a balanced diet, get plenty of sleep, and do not smoke. Avoid using alcohol or drugs to relax.  Strong support network. Spend time with family, friends, or other people you enjoy being around.Express your feelings and talk things over with someone you trust.  Counseling or talktherapy with a mental health professional may be helpful if you are having difficulty managing stress on your own. Medicine is typically not recommended for the treatment of stress.Talk to your health care provider if you think you need medicine for symptoms of stress. Follow these instructions at home:  Keep all follow-up visits as directed by your health care provider.  Take all medicines as directed by your health care provider. Contact a health care provider if:  Your symptoms get worse or you start having new symptoms.  You feel overwhelmed by your problems and can no longer manage them on your own. Get help right away if:  You feel like hurting yourself or someone else. This information is not intended to replace advice given to you by your health care provider. Make sure you discuss any questions you have with  your health care provider. Document Released: 03/02/2001 Document Revised: 02/12/2016 Document Reviewed: 05/01/2013 Elsevier Interactive Patient Education  2017 [employer].

## 2018-07-06 ENCOUNTER — Encounter: Payer: Self-pay | Admitting: Family Medicine

## 2018-07-06 ENCOUNTER — Ambulatory Visit (INDEPENDENT_AMBULATORY_CARE_PROVIDER_SITE_OTHER): Payer: Medicare HMO | Admitting: Family Medicine

## 2018-07-06 VITALS — BP 134/72 | HR 88 | Ht 60.0 in | Wt 138.0 lb

## 2018-07-06 DIAGNOSIS — F411 Generalized anxiety disorder: Secondary | ICD-10-CM

## 2018-07-06 MED ORDER — HYDROXYZINE HCL 25 MG PO TABS: 25.0000 mg | ORAL_TABLET | Freq: Every evening | ORAL | 0 refills | Status: DC | PRN

## 2018-07-06 MED ORDER — SERTRALINE HCL 100 MG PO TABS: 100.0000 mg | ORAL_TABLET | Freq: Every day | ORAL | 1 refills | Status: DC

## 2018-07-06 NOTE — Progress Notes (Signed)
   Subjective:    Patient ID: Nancy Wilson, female    DOB: 1941/06/15, 77 y.o.   MRN: [id number]  HPI  77 year old female comes in today to follow-up for depressed mood.  She actually came in for her Medicare wellness exam earlier this week and on screening she screen positive for depression.  She has been struggling with still taking care of an adult child who is almost 20 who has Down syndrome.  She has lived with them her entire life but more recently she is becoming more combative and irritable.  She is not wanting to go to bed at night and Deanndra just feels like she is getting to the point where she cannot handle her very well.  Her husband is really just checked out it is not really helping her being very involved.  Her daughter though is also been losing weight and not eating because of fear of having diarrhea and has been having some significant hair loss.  Getting so worked up at night that she is having a hard time falling asleep.  Sometimes stay awake until 2 and 3 in the morning because she just feels so worked up she cannot relax and go to sleep.  She feels like her Wellbutrin helps with her energy level.    She is currently on sertraline 50 mg which was restarted about 6 weeks ago and she is also taking Wellbutrin.  Review of Systems     Objective:   Physical Exam  Constitutional: She is oriented to person, place, and time. She appears well-developed and well-nourished.  HENT:  Head: Normocephalic and atraumatic.  Cardiovascular: Normal rate, regular rhythm and normal heart sounds.  Pulmonary/Chest: Effort normal and breath sounds normal.  Neurological: She is alert and oriented to person, place, and time.  Skin: Skin is warm and dry.  Psychiatric: She has a normal mood and affect. Her behavior is normal.        Assessment & Plan:  Depression/Generalized anxiety disorder-PHQ score of 6.   Gad 7 score of 3.    Will increase sertraline to 100 mg and will add hydroxyzine at  bedtime.  See her back in 6 weeks to make sure that she is improving and doing a little better.  We also discussed asking her family for help if she needs a break and is feeling overwhelmed.  Also strongly encouraged her to take her daughter and have her evaluated.  She Artie has an appointment scheduled with her physician.  It sounds like she may have some actual medical problems going on that might actually be aggravating her mood.   Time spent 20 min, greater than  50% of time spent counseling about depression and anxiety.

## 2018-08-02 DIAGNOSIS — Z1231 Encounter for screening mammogram for malignant neoplasm of breast: Secondary | ICD-10-CM | POA: Diagnosis not present

## 2018-08-11 ENCOUNTER — Other Ambulatory Visit: Payer: Self-pay | Admitting: Family Medicine

## 2018-08-14 ENCOUNTER — Other Ambulatory Visit: Payer: Self-pay | Admitting: *Deleted

## 2018-08-14 MED ORDER — ATORVASTATIN CALCIUM 40 MG PO TABS: 40.0000 mg | ORAL_TABLET | Freq: Every day | ORAL | 3 refills | Status: DC

## 2018-08-23 ENCOUNTER — Ambulatory Visit: Payer: Medicare HMO | Admitting: Family Medicine

## 2018-08-25 ENCOUNTER — Other Ambulatory Visit: Payer: Self-pay | Admitting: Family Medicine

## 2018-08-25 MED ORDER — ACYCLOVIR 400 MG PO TABS: 400.0000 mg | ORAL_TABLET | Freq: Every day | ORAL | 1 refills | Status: DC

## 2018-08-30 ENCOUNTER — Encounter: Payer: Self-pay | Admitting: Family Medicine

## 2018-08-30 ENCOUNTER — Ambulatory Visit (INDEPENDENT_AMBULATORY_CARE_PROVIDER_SITE_OTHER): Payer: Medicare HMO | Admitting: Family Medicine

## 2018-08-30 VITALS — BP 138/69 | HR 70 | Ht 60.0 in | Wt 139.0 lb

## 2018-08-30 DIAGNOSIS — N3281 Overactive bladder: Secondary | ICD-10-CM

## 2018-08-30 DIAGNOSIS — F418 Other specified anxiety disorders: Secondary | ICD-10-CM

## 2018-08-30 MED ORDER — PROMETHAZINE HCL 25 MG PO TABS: 25.0000 mg | ORAL_TABLET | Freq: Three times a day (TID) | ORAL | 3 refills | Status: DC | PRN

## 2018-08-30 MED ORDER — HYDROXYZINE HCL 25 MG PO TABS: ORAL_TABLET | ORAL | 1 refills | Status: DC

## 2018-08-30 MED ORDER — PROPRANOLOL HCL 40 MG PO TABS: 40.0000 mg | ORAL_TABLET | Freq: Two times a day (BID) | ORAL | 3 refills | Status: DC

## 2018-08-30 MED ORDER — OXYBUTYNIN CHLORIDE 5 MG PO TABS: 5.0000 mg | ORAL_TABLET | Freq: Two times a day (BID) | ORAL | 1 refills | Status: DC

## 2018-08-30 MED ORDER — ELETRIPTAN HYDROBROMIDE 20 MG PO TABS: 20.0000 mg | ORAL_TABLET | ORAL | 1 refills | Status: DC | PRN

## 2018-08-30 MED ORDER — BUPROPION HCL ER (XL) 300 MG PO TB24: 300.0000 mg | ORAL_TABLET | Freq: Every day | ORAL | 3 refills | Status: DC

## 2018-08-30 MED ORDER — AMLODIPINE BESYLATE 10 MG PO TABS
10.0000 mg | ORAL_TABLET | Freq: Every day | ORAL | 1 refills | Status: DC
Start: 2018-08-30 — End: 2018-10-24

## 2018-08-30 NOTE — Progress Notes (Signed)
Subjective:    CC: F/u Mood   HPI:  77 year old female is here today to follow-up for depressed mood and anxiety. She feels she is overall doing better. Some of the outburst from her daughter with special needs has been much better since her daughter was started on medication.   OAB - she says the detrol xl 10 mg is too expensive.  She feels like the medication has really been helpful and she is not experiencing any side effects.    Past medical history, Surgical history, Family history not pertinant except as noted below, Social history, Allergies, and medications have been entered into the medical record, reviewed, and corrections made.   Review of Systems: No fevers, chills, night sweats, weight loss, chest pain, or shortness of breath.   Objective:    General: Well Developed, well nourished, and in no acute distress.  Neuro: Alert and oriented x3, extra-ocular muscles intact, sensation grossly intact.  HEENT: Normocephalic, atraumatic  Skin: Warm and dry, no rashes. Cardiac: Regular rate and rhythm, no murmurs rubs or gallops, no lower extremity edema.  Respiratory: Clear to auscultation bilaterally. Not using accessory muscles, speaking in full sentences.   Impression and Recommendations:   Depession/GAD -stable.  Even though her PHQ 9 and gad 7 scores that improved significantly she overall feels like things are much better and her stress levels are down.  She feels like her husband has been more supportive with her special needs daughter though she says that he leaves her at home with her quite frequently and that is a little frustrating.  But she does feel like the situation has improved and that her mood has improved.  For now we will continue with sertraline when I see her back in 3 months if she still doing well we can even consider weaning off the medication as I do think some of this is acute related.  OAB - will change to BID dosing on the detrol to see if cheaper.

## 2018-09-14 DIAGNOSIS — F419 Anxiety disorder, unspecified: Secondary | ICD-10-CM | POA: Diagnosis not present

## 2018-09-14 DIAGNOSIS — Z881 Allergy status to other antibiotic agents status: Secondary | ICD-10-CM | POA: Diagnosis not present

## 2018-09-14 DIAGNOSIS — E785 Hyperlipidemia, unspecified: Secondary | ICD-10-CM | POA: Diagnosis not present

## 2018-09-14 DIAGNOSIS — Z885 Allergy status to narcotic agent status: Secondary | ICD-10-CM | POA: Diagnosis not present

## 2018-09-14 DIAGNOSIS — I10 Essential (primary) hypertension: Secondary | ICD-10-CM | POA: Diagnosis not present

## 2018-09-14 DIAGNOSIS — S0101XA Laceration without foreign body of scalp, initial encounter: Secondary | ICD-10-CM | POA: Diagnosis not present

## 2018-09-14 DIAGNOSIS — F329 Major depressive disorder, single episode, unspecified: Secondary | ICD-10-CM | POA: Diagnosis not present

## 2018-09-14 DIAGNOSIS — S0990XA Unspecified injury of head, initial encounter: Secondary | ICD-10-CM | POA: Diagnosis not present

## 2018-09-14 DIAGNOSIS — K219 Gastro-esophageal reflux disease without esophagitis: Secondary | ICD-10-CM | POA: Diagnosis not present

## 2018-09-14 DIAGNOSIS — Z9181 History of falling: Secondary | ICD-10-CM | POA: Diagnosis not present

## 2018-09-15 ENCOUNTER — Telehealth: Payer: Self-pay | Admitting: Family Medicine

## 2018-09-15 NOTE — Telephone Encounter (Signed)
Routing to PCP to see if an acute slot can be used for this.

## 2018-09-15 NOTE — Telephone Encounter (Signed)
Yes, ok for acute slot.

## 2018-09-15 NOTE — Telephone Encounter (Signed)
[facility]. States she had 3 staples place in head. Needs to be seen on 09/21/2017 or 09/22/2017. No open slots. Please advise.

## 2018-09-18 NOTE — Telephone Encounter (Signed)
Scheduled 09/21/18 for 10:00

## 2018-09-18 NOTE — Telephone Encounter (Signed)
Routing to scheduler

## 2018-09-21 ENCOUNTER — Ambulatory Visit (INDEPENDENT_AMBULATORY_CARE_PROVIDER_SITE_OTHER): Payer: Medicare Other | Admitting: Family Medicine

## 2018-09-21 ENCOUNTER — Encounter: Payer: Self-pay | Admitting: Family Medicine

## 2018-09-21 DIAGNOSIS — S0101XA Laceration without foreign body of scalp, initial encounter: Secondary | ICD-10-CM | POA: Diagnosis not present

## 2018-09-21 DIAGNOSIS — R296 Repeated falls: Secondary | ICD-10-CM | POA: Diagnosis not present

## 2018-09-21 NOTE — Progress Notes (Signed)
ED follow up - Acute Office Visit  Subjective:    Patient ID: Nancy Wilson, female    DOB: 1940-11-28, 78 y.o.   MRN: [id number]  Chief Complaint  Patient presents with  . Suture / Staple Removal    HPI Patient is in today for follow- up from the emergency department for a fall where she fell and hit the back of her head on a granite surface.  It produced a laceration and it had to be repaired with staples of the emergency department on December 26..  She reports that she is actually fallen multiple times over the last year. She has fallen 26 times this year.   Says she is not sure why she has been falling.  She denies any lightheadedness or dizziness before falling.  She denies any gait instability or balance issues.  She said this last fall was actually witnessed and I asked if she had actually passed out or had near syncope and she denied this.  I asked if she felt like her joints or legs were feeling weak or giving out and she said no she has not noticed any issues with that.  She denies any chest pain palpitations etc. around the time that this happens.  They did do a CT in the emergency department because she was on a blood thinner.  It was normal.  No mass or lesions.  2019 December CT Head WO IV Contrast12/26/2019 [facility] Result Impression  IMPRESSION:   No acute intracranial abnormality.  Electronically Signed by: Azzie Almas  Result Narrative  INDICATION:  head injury,  COMPARISON:  None.  TECHNIQUE:  Multiple axial images obtained from the skull base to the vertex without IV contrast were obtained on 09/14/2018 1:37 AM  FINDINGS:  No evidence of hydrocephalus. No intracranial mass, mass effect or midline shift. No acute infarction evident. No intracranial hemorrhage. Paranasal sinuses: Clear. Mastoid air cells: Clear. Calvarium: Intact.  Other Result Information  Acute Interface, Incoming Rad Results - 09/14/2018  2:58 AM EST INDICATION:     head injury,  COMPARISON:   None.  TECHNIQUE:    Multiple axial images obtained from the skull base to the vertex without IV contrast  were obtained on  09/14/2018 1:37 AM  FINDINGS:  No evidence of hydrocephalus. No intracranial mass, mass effect or midline shift. No acute infarction evident. No intracranial hemorrhage. Paranasal sinuses: Clear. Mastoid air cells: Clear. Calvarium: Intact.   IMPRESSION:   No acute intracranial abnormality.     Past Medical History:  Diagnosis Date  . Allergy   . Depression    long standing  . Hyperlipidemia   . Hypertension     Past Surgical History:  Procedure Laterality Date  . carpel tunnel release, both hands  1980's  . DILATION AND CURETTAGE OF UTERUS    . GASTROSCOPY  11-06  . trigger finger release, left finger    . TUBAL LIGATION     bilateral    Family History  Problem Relation Age of Onset  . Diabetes Mother   . Hyperlipidemia Mother   . Heart disease Mother 5  . Hypertension Mother   . Diabetes Father   . Hyperlipidemia Father   . Tremor Father        hand- started in his 1's  . Hypertension Father   . Tremor Cousin     Social History   Socioeconomic History  . Marital status: Married    Spouse name: Geographical information systems officer  . Number  of children: 2  . Years of education: 9th  . Highest education level: Not on file  Occupational History  . Occupation: Housewife  Social Needs  . Financial resource strain: Not hard at all  . Food insecurity:    Worry: Never true    Inability: Never true  . Transportation needs:    Medical: No    Non-medical: No  Tobacco Use  . Smoking status: Never Smoker  . Smokeless tobacco: Never Used  Substance and Sexual Activity  . Alcohol use: No    Alcohol/week: 0.0 standard drinks  . Drug use: No  . Sexual activity: Not Currently  Lifestyle  . Physical activity:    Days per week: 0 days    Minutes per session: 0 min  . Stress: Only a little  Relationships  . Social  connections:    Talks on phone: More than three times a week    Gets together: Once a week    Attends religious service: Never    Active member of club or organization: No    Attends meetings of clubs or organizations: Never    Relationship status: Married  . Intimate partner violence:    Fear of current or ex partner: No    Emotionally abused: No    Physically abused: No    Forced sexual activity: No  Other Topics Concern  . Not on file  Social History Narrative   No caffeine use.    Outpatient Medications Prior to Visit  Medication Sig Dispense Refill  . acyclovir (ZOVIRAX) 400 MG tablet Take 1 tablet (400 mg total) by mouth at bedtime. 90 tablet 1  . amLODipine (NORVASC) 10 MG tablet Take 1 tablet (10 mg total) by mouth daily. 90 tablet 1  . atorvastatin (LIPITOR) 40 MG tablet Take 1 tablet (40 mg total) by mouth daily. 90 tablet 3  . buPROPion (WELLBUTRIN XL) 300 MG 24 hr tablet Take 1 tablet (300 mg total) by mouth daily. 90 tablet 3  . Cholecalciferol (VITAMIN D3) 2000 units capsule Take 2,000 Units by mouth daily.    Marland Kitchen eletriptan (RELPAX) 20 MG tablet Take 1 tablet (20 mg total) by mouth as needed for migraine or headache. May repeat in 2 hours if headache persists or recurs. 10 tablet 1  . hydrOXYzine (ATARAX/VISTARIL) 25 MG tablet TAKE 1 TO 2 TABLETS (25-50 MG TOTAL) BY MOUTH AT BEDTIME AS NEEDED FOR ANXIETY. 90 tablet 1  . oxybutynin (DITROPAN) 5 MG tablet Take 1 tablet (5 mg total) by mouth 2 (two) times daily. 180 tablet 1  . promethazine (PHENERGAN) 25 MG tablet Take 1 tablet (25 mg total) by mouth every 8 (eight) hours as needed for nausea or vomiting. 20 tablet 3  . propranolol (INDERAL) 40 MG tablet Take 1 tablet (40 mg total) by mouth 2 (two) times daily. 180 tablet 3  . sertraline (ZOLOFT) 100 MG tablet Take 1 tablet (100 mg total) by mouth at bedtime. 90 tablet 1   No facility-administered medications prior to visit.     Allergies  Allergen Reactions  .  Hydrocodone-Acetaminophen Hives  . Codeine   . Doxycycline   . Guaifenesin   . Meperidine Hcl   . Metronidazole   . Morphine   . Penicillins   . Prednisone   . Propoxyphene Nausea And Vomiting    ROS     Objective:    Physical Exam  Constitutional: She is oriented to person, place, and time. She appears well-developed and well-nourished.  HENT:  Head: Normocephalic and atraumatic.  Neck: Neck supple. No thyromegaly present.  Cardiovascular: Normal rate, regular rhythm and normal heart sounds.  Pulmonary/Chest: Effort normal and breath sounds normal.  Musculoskeletal:     Comments: Strength in the hips knees and ankles is 5 out of 5 bilaterally.  Strength in the upper extremities is 5-5 as well.  She has a normal gait.  She did have a little bit of difficulty walking heel-to-toe but was able to walk forwards and backwards without any significant difficulty.  Lymphadenopathy:    She has no cervical adenopathy.  Neurological: She is alert and oriented to person, place, and time. No cranial nerve deficit.  Skin: Skin is warm and dry.  Psychiatric: She has a normal mood and affect. Her behavior is normal.    BP 136/65   Pulse 84   Ht 5' (1.524 m)   Wt 137 lb (62.1 kg)   SpO2 99%   BMI 26.76 kg/m  Wt Readings from Last 3 Encounters:  09/21/18 137 lb (62.1 kg)  08/30/18 139 lb (63 kg)  07/06/18 138 lb (62.6 kg)    Health Maintenance Due  Topic Date Due  . HEMOGLOBIN A1C  1940-10-11  . FOOT EXAM  09/30/1951  . OPHTHALMOLOGY EXAM  09/30/1951  . URINE MICROALBUMIN  09/30/1951    There are no preventive care reminders to display for this patient.   Lab Results  Component Value Date   TSH 2.24 12/02/2016   Lab Results  Component Value Date   WBC 5.3 09/18/2014   HGB 14.2 09/18/2014   HCT 43.7 09/18/2014   MCV 97.3 09/18/2014   PLT 282 09/18/2014   Lab Results  Component Value Date   NA 140 12/02/2016   K 4.7 12/02/2016   CO2 22 12/02/2016   GLUCOSE 92  12/02/2016   BUN 19 12/02/2016   CREATININE 1.08 (H) 12/02/2016   BILITOT 0.6 12/02/2016   ALKPHOS 92 12/02/2016   AST 21 12/02/2016   ALT 16 12/02/2016   PROT 6.7 12/02/2016   ALBUMIN 4.3 12/02/2016   CALCIUM 9.5 12/02/2016   Lab Results  Component Value Date   CHOL 337 (H) 12/02/2016   Lab Results  Component Value Date   HDL 52 12/02/2016   Lab Results  Component Value Date   LDLCALC 147 (H) 05/02/2015   Lab Results  Component Value Date   TRIG 148 12/02/2016   Lab Results  Component Value Date   CHOLHDL 6.5 (H) 12/02/2016   No results found for: HGBA1C     Assessment & Plan:   Problem List Items Addressed This Visit      Other   Laceration of scalp   Frequent falls   Relevant Orders   Ambulatory referral to Neurology     Laceration of scalp-sutures removed today.  Patient tolerated well.  Incision actually looks really great is healing well and is clean dry and intact.  Okay to wash hair.  Do not scrub at the scab and just allow it to heal.  Frequent falls-unclear etiology at this point.  She is denying any dizziness lightheadedness chest pain palpitations weakness of the extremities that would cause her to fall.  Just seems really unusual. Will refer her to Neurology.   No orders of the defined types were placed in this encounter.    Nani Gasseratherine Metheney, MD

## 2018-10-24 ENCOUNTER — Other Ambulatory Visit: Payer: Self-pay

## 2018-10-24 MED ORDER — AMLODIPINE BESYLATE 10 MG PO TABS: 10.0000 mg | ORAL_TABLET | Freq: Every day | ORAL | 1 refills | Status: DC

## 2018-10-24 MED ORDER — BUPROPION HCL ER (XL) 300 MG PO TB24: 300.0000 mg | ORAL_TABLET | Freq: Every day | ORAL | 3 refills | Status: DC

## 2018-10-24 MED ORDER — HYDROXYZINE HCL 25 MG PO TABS: ORAL_TABLET | ORAL | 1 refills | Status: DC

## 2018-10-24 MED ORDER — ATORVASTATIN CALCIUM 40 MG PO TABS: 40.0000 mg | ORAL_TABLET | Freq: Every day | ORAL | 3 refills | Status: DC

## 2018-10-24 MED ORDER — PROPRANOLOL HCL 40 MG PO TABS: 40.0000 mg | ORAL_TABLET | Freq: Two times a day (BID) | ORAL | 3 refills | Status: DC

## 2018-10-24 MED ORDER — SERTRALINE HCL 100 MG PO TABS: 100.0000 mg | ORAL_TABLET | Freq: Every day | ORAL | 1 refills | Status: DC

## 2018-10-26 DIAGNOSIS — R2689 Other abnormalities of gait and mobility: Secondary | ICD-10-CM | POA: Diagnosis not present

## 2018-10-26 DIAGNOSIS — Z9181 History of falling: Secondary | ICD-10-CM | POA: Diagnosis not present

## 2018-11-02 ENCOUNTER — Other Ambulatory Visit: Payer: Self-pay

## 2018-11-02 MED ORDER — ACYCLOVIR 400 MG PO TABS: 400.0000 mg | ORAL_TABLET | Freq: Every day | ORAL | 1 refills | Status: DC

## 2018-11-29 ENCOUNTER — Ambulatory Visit (INDEPENDENT_AMBULATORY_CARE_PROVIDER_SITE_OTHER): Payer: Medicare Other | Admitting: Family Medicine

## 2018-11-29 ENCOUNTER — Other Ambulatory Visit: Payer: Self-pay

## 2018-11-29 ENCOUNTER — Encounter: Payer: Self-pay | Admitting: Family Medicine

## 2018-11-29 VITALS — BP 138/84 | HR 79 | Ht 60.0 in | Wt 141.0 lb

## 2018-11-29 DIAGNOSIS — M2550 Pain in unspecified joint: Secondary | ICD-10-CM | POA: Diagnosis not present

## 2018-11-29 DIAGNOSIS — N183 Chronic kidney disease, stage 3 (moderate): Secondary | ICD-10-CM

## 2018-11-29 DIAGNOSIS — F418 Other specified anxiety disorders: Secondary | ICD-10-CM | POA: Insufficient documentation

## 2018-11-29 DIAGNOSIS — N1831 Chronic kidney disease, stage 3a: Secondary | ICD-10-CM

## 2018-11-29 DIAGNOSIS — G2581 Restless legs syndrome: Secondary | ICD-10-CM | POA: Insufficient documentation

## 2018-11-29 DIAGNOSIS — I10 Essential (primary) hypertension: Secondary | ICD-10-CM

## 2018-11-29 DIAGNOSIS — I8393 Asymptomatic varicose veins of bilateral lower extremities: Secondary | ICD-10-CM

## 2018-11-29 DIAGNOSIS — E785 Hyperlipidemia, unspecified: Secondary | ICD-10-CM | POA: Diagnosis not present

## 2018-11-29 LAB — POCT UA - MICROALBUMIN
Creatinine, POC: 100 mg/dL
Microalbumin Ur, POC: 30 mg/L

## 2018-11-29 NOTE — Patient Instructions (Signed)
If your iron comes back normal then we can consider medication to help treat restless leg if you would like.

## 2018-11-29 NOTE — Progress Notes (Signed)
Subjective:    CC: F/u mood, 3 mo visit.   HPI:  F/U depression and anxiety -increase dher sertraline to 100 mg daily in October.  She still struggling with her special needs daughter who has been very defiant but feels like she is coping better.  She feels like the medication is helpful and denies any side effects or problems.  Hypertension- Pt denies chest pain, SOB, dizziness, or heart palpitations.  Taking meds as directed w/o problems.  Denies medication side effects.    F/U kidney disease stage III-no recent changes.  No active urinary symptoms.  BFR is usually in the 50s and has been for the last couple of years.  Follow-up hyperlipidemia- Currently on Lipitor 40mg  daily.  Tolerating this well without any side effects or problems.  She is overdue for lab work including liver function and recheck on lipids.  She also complains of feeling like she has to move her legs a lot at night.  She says they are "dancing".  It is been going on for years and only seems to happen when she gets ready to relax to go to sleep at night.  She says some nights it makes it difficult to actually fall asleep.  Past medical history, Surgical history, Family history not pertinant except as noted below, Social history, Allergies, and medications have been entered into the medical record, reviewed, and corrections made.   Review of Systems: No fevers, chills, night sweats, weight loss, chest pain, or shortness of breath.   Objective:    General: Well Developed, well nourished, and in no acute distress.  Neuro: Alert and oriented x3, extra-ocular muscles intact, sensation grossly intact.  HEENT: Normocephalic, atraumatic  Skin: Warm and dry, no rashes.  She did have very large bulging varicose veins on both legs and thighs.  No erythema increased warmth or hardness to the veins. Cardiac: Regular rate and rhythm, no murmurs rubs or gallops, no lower extremity edema.  Respiratory: Clear to auscultation  bilaterally. Not using accessory muscles, speaking in full sentences.   Impression and Recommendations:   Depression/anxiety- we will.  Continue with Zoloft 100 mg daily.  HTN - Well controlled. Continue current regimen. Follow up in  6 months.   CKD 3 -due to recheck renal function.  Reminded her that she is 2 years out from lab work and we have to get this updated today.  Also will have her complete a urine microalbumin.  Hyperlipidemia -due to repeat liver and lipid enzymes.  Varicose veins-explained the difference between superficial and deep clots and is only the deep clots that were worried about.  I discussed with her how they will present differently and what to look out for.  In the meantime encouraged her to continue to wear her compression hose which she says she normally wear she just did not wear them today so that I could take a look at her legs.  And if at any point she starts having a lot of pain discomfort whether it is heaviness aching, itching or burning over the veins then please let me know and we can always get a consultation for with a vein specialist.   RLS -based on her description it sounds like she probably has restless leg syndrome.  New diagnosis.  Discussed differential and diagnosis with her today.  Will evaluate for iron deficiency anemia first.  If negative then consider medication treatment options.

## 2018-12-01 LAB — CBC
HEMATOCRIT: 40 % (ref 35.0–45.0)
HEMOGLOBIN: 13.5 g/dL (ref 11.7–15.5)
MCH: 32.1 pg (ref 27.0–33.0)
MCHC: 33.8 g/dL (ref 32.0–36.0)
MCV: 95.2 fL (ref 80.0–100.0)
MPV: 10 fL (ref 7.5–12.5)
Platelets: 244 10*3/uL (ref 140–400)
RBC: 4.2 10*6/uL (ref 3.80–5.10)
RDW: 12.5 % (ref 11.0–15.0)
WBC: 6.2 10*3/uL (ref 3.8–10.8)

## 2018-12-01 LAB — COMPLETE METABOLIC PANEL WITH GFR
AG Ratio: 1.8 (calc) (ref 1.0–2.5)
ALKALINE PHOSPHATASE (APISO): 87 U/L (ref 37–153)
ALT: 14 U/L (ref 6–29)
AST: 16 U/L (ref 10–35)
Albumin: 4.2 g/dL (ref 3.6–5.1)
BILIRUBIN TOTAL: 0.6 mg/dL (ref 0.2–1.2)
BUN / CREAT RATIO: 14 (calc) (ref 6–22)
BUN: 17 mg/dL (ref 7–25)
CHLORIDE: 104 mmol/L (ref 98–110)
CO2: 27 mmol/L (ref 20–32)
Calcium: 9.4 mg/dL (ref 8.6–10.4)
Creat: 1.22 mg/dL — ABNORMAL HIGH (ref 0.60–0.93)
GFR, Est African American: 49 mL/min/{1.73_m2} — ABNORMAL LOW (ref >=60)
GFR, Est Non African American: 43 mL/min/{1.73_m2} — ABNORMAL LOW (ref >=60)
GLUCOSE: 91 mg/dL (ref 65–99)
Globulin: 2.3 g/dL (calc) (ref 1.9–3.7)
Potassium: 4.5 mmol/L (ref 3.5–5.3)
Sodium: 139 mmol/L (ref 135–146)
Total Protein: 6.5 g/dL (ref 6.1–8.1)

## 2018-12-01 LAB — LIPID PANEL
CHOL/HDL RATIO: 3.8 (calc) (ref <5.0)
Cholesterol: 231 mg/dL — ABNORMAL HIGH (ref <200)
HDL: 61 mg/dL (ref >=50)
LDL CHOLESTEROL (CALC): 147 mg/dL — AB
Non-HDL Cholesterol (Calc): 170 mg/dL (calc) — ABNORMAL HIGH (ref <130)
Triglycerides: 115 mg/dL (ref <150)

## 2018-12-01 LAB — IRON: IRON: 88 ug/dL (ref 45–160)

## 2018-12-01 LAB — FERRITIN: FERRITIN: 90 ng/mL (ref 16–288)

## 2018-12-01 LAB — TSH: TSH: 2.88 mIU/L (ref 0.40–4.50)

## 2018-12-01 MED ORDER — ROPINIROLE HCL 0.25 MG PO TABS: ORAL_TABLET | ORAL | 1 refills | Status: DC

## 2018-12-01 NOTE — Addendum Note (Signed)
Addended by: Nani Gasser D on: 12/01/2018 12:39 PM   Modules accepted: Orders

## 2018-12-11 DIAGNOSIS — W19XXXA Unspecified fall, initial encounter: Secondary | ICD-10-CM | POA: Diagnosis not present

## 2018-12-11 DIAGNOSIS — G8911 Acute pain due to trauma: Secondary | ICD-10-CM | POA: Diagnosis not present

## 2018-12-11 DIAGNOSIS — K219 Gastro-esophageal reflux disease without esophagitis: Secondary | ICD-10-CM | POA: Diagnosis not present

## 2018-12-11 DIAGNOSIS — Z880 Allergy status to penicillin: Secondary | ICD-10-CM | POA: Diagnosis not present

## 2018-12-11 DIAGNOSIS — Z885 Allergy status to narcotic agent status: Secondary | ICD-10-CM | POA: Diagnosis not present

## 2018-12-11 DIAGNOSIS — M25532 Pain in left wrist: Secondary | ICD-10-CM | POA: Diagnosis not present

## 2018-12-11 DIAGNOSIS — M1990 Unspecified osteoarthritis, unspecified site: Secondary | ICD-10-CM | POA: Diagnosis not present

## 2018-12-11 DIAGNOSIS — S52601A Unspecified fracture of lower end of right ulna, initial encounter for closed fracture: Secondary | ICD-10-CM | POA: Diagnosis not present

## 2018-12-11 DIAGNOSIS — Z888 Allergy status to other drugs, medicaments and biological substances status: Secondary | ICD-10-CM | POA: Diagnosis not present

## 2018-12-11 DIAGNOSIS — Z79899 Other long term (current) drug therapy: Secondary | ICD-10-CM | POA: Diagnosis not present

## 2018-12-11 DIAGNOSIS — S52601D Unspecified fracture of lower end of right ulna, subsequent encounter for closed fracture with routine healing: Secondary | ICD-10-CM | POA: Diagnosis not present

## 2018-12-11 DIAGNOSIS — S0083XA Contusion of other part of head, initial encounter: Secondary | ICD-10-CM | POA: Diagnosis not present

## 2018-12-11 DIAGNOSIS — S0990XA Unspecified injury of head, initial encounter: Secondary | ICD-10-CM | POA: Diagnosis not present

## 2018-12-11 DIAGNOSIS — E785 Hyperlipidemia, unspecified: Secondary | ICD-10-CM | POA: Diagnosis not present

## 2018-12-11 DIAGNOSIS — S52501D Unspecified fracture of the lower end of right radius, subsequent encounter for closed fracture with routine healing: Secondary | ICD-10-CM | POA: Diagnosis not present

## 2018-12-11 DIAGNOSIS — I10 Essential (primary) hypertension: Secondary | ICD-10-CM | POA: Diagnosis not present

## 2018-12-11 DIAGNOSIS — S52501A Unspecified fracture of the lower end of right radius, initial encounter for closed fracture: Secondary | ICD-10-CM | POA: Diagnosis not present

## 2018-12-11 DIAGNOSIS — S52551A Other extraarticular fracture of lower end of right radius, initial encounter for closed fracture: Secondary | ICD-10-CM | POA: Diagnosis not present

## 2018-12-12 DIAGNOSIS — S52551A Other extraarticular fracture of lower end of right radius, initial encounter for closed fracture: Secondary | ICD-10-CM | POA: Diagnosis not present

## 2018-12-12 DIAGNOSIS — W19XXXA Unspecified fall, initial encounter: Secondary | ICD-10-CM | POA: Diagnosis not present

## 2018-12-13 DIAGNOSIS — S62101A Fracture of unspecified carpal bone, right wrist, initial encounter for closed fracture: Secondary | ICD-10-CM | POA: Diagnosis not present

## 2018-12-15 DIAGNOSIS — Z885 Allergy status to narcotic agent status: Secondary | ICD-10-CM | POA: Diagnosis not present

## 2018-12-15 DIAGNOSIS — G8918 Other acute postprocedural pain: Secondary | ICD-10-CM | POA: Diagnosis not present

## 2018-12-15 DIAGNOSIS — Z880 Allergy status to penicillin: Secondary | ICD-10-CM | POA: Diagnosis not present

## 2018-12-15 DIAGNOSIS — E785 Hyperlipidemia, unspecified: Secondary | ICD-10-CM | POA: Diagnosis not present

## 2018-12-15 DIAGNOSIS — S52691A Other fracture of lower end of right ulna, initial encounter for closed fracture: Secondary | ICD-10-CM | POA: Diagnosis not present

## 2018-12-15 DIAGNOSIS — I129 Hypertensive chronic kidney disease with stage 1 through stage 4 chronic kidney disease, or unspecified chronic kidney disease: Secondary | ICD-10-CM | POA: Diagnosis not present

## 2018-12-15 DIAGNOSIS — N183 Chronic kidney disease, stage 3 (moderate): Secondary | ICD-10-CM | POA: Diagnosis not present

## 2018-12-15 DIAGNOSIS — Z888 Allergy status to other drugs, medicaments and biological substances status: Secondary | ICD-10-CM | POA: Diagnosis not present

## 2018-12-15 DIAGNOSIS — M47816 Spondylosis without myelopathy or radiculopathy, lumbar region: Secondary | ICD-10-CM | POA: Diagnosis not present

## 2018-12-15 DIAGNOSIS — Z79899 Other long term (current) drug therapy: Secondary | ICD-10-CM | POA: Diagnosis not present

## 2018-12-15 DIAGNOSIS — S52551A Other extraarticular fracture of lower end of right radius, initial encounter for closed fracture: Secondary | ICD-10-CM | POA: Diagnosis not present

## 2018-12-15 DIAGNOSIS — M1990 Unspecified osteoarthritis, unspecified site: Secondary | ICD-10-CM | POA: Diagnosis not present

## 2018-12-15 DIAGNOSIS — Z881 Allergy status to other antibiotic agents status: Secondary | ICD-10-CM | POA: Diagnosis not present

## 2018-12-15 DIAGNOSIS — K219 Gastro-esophageal reflux disease without esophagitis: Secondary | ICD-10-CM | POA: Diagnosis not present

## 2018-12-15 DIAGNOSIS — M4056 Lordosis, unspecified, lumbar region: Secondary | ICD-10-CM | POA: Diagnosis not present

## 2018-12-15 DIAGNOSIS — Z043 Encounter for examination and observation following other accident: Secondary | ICD-10-CM | POA: Diagnosis not present

## 2018-12-15 DIAGNOSIS — S52501D Unspecified fracture of the lower end of right radius, subsequent encounter for closed fracture with routine healing: Secondary | ICD-10-CM | POA: Diagnosis not present

## 2018-12-15 DIAGNOSIS — S52552A Other extraarticular fracture of lower end of left radius, initial encounter for closed fracture: Secondary | ICD-10-CM | POA: Diagnosis not present

## 2018-12-15 DIAGNOSIS — M4856XA Collapsed vertebra, not elsewhere classified, lumbar region, initial encounter for fracture: Secondary | ICD-10-CM | POA: Diagnosis not present

## 2018-12-15 DIAGNOSIS — S52601A Unspecified fracture of lower end of right ulna, initial encounter for closed fracture: Secondary | ICD-10-CM | POA: Diagnosis not present

## 2018-12-20 ENCOUNTER — Telehealth: Payer: Self-pay

## 2018-12-20 NOTE — Telephone Encounter (Signed)
Patient called, she fell and broke wrist and had to have surgery. Pt wanting to know if Dr Linford Arnold can refill her percocet for her.   I called pt back and advised her that per the law, Dr Linford Arnold cannot write this medication that someone else is managing. Pt states she understands and will reach back out to her ortho.

## 2018-12-21 MED ORDER — FLUCONAZOLE 150 MG PO TABS: 150.0000 mg | ORAL_TABLET | Freq: Once | ORAL | 1 refills | Status: AC

## 2018-12-21 NOTE — Telephone Encounter (Signed)
Pt advised.

## 2018-12-21 NOTE — Telephone Encounter (Signed)
Pt called today requesting diflucan rx. States she is on "pain meds" and now she's "itching down there." Denies any discharge, odor, visual rash. Routing.

## 2018-12-21 NOTE — Addendum Note (Signed)
Addended by: Nani Gasser D on: 12/21/2018 02:45 PM   Modules accepted: Orders

## 2018-12-21 NOTE — Telephone Encounter (Signed)
rx sent to [facility] for diflucan. If not better than needs an appointment and or lab order for wet prep.

## 2018-12-27 DIAGNOSIS — R52 Pain, unspecified: Secondary | ICD-10-CM | POA: Diagnosis not present

## 2018-12-27 DIAGNOSIS — S32010A Wedge compression fracture of first lumbar vertebra, initial encounter for closed fracture: Secondary | ICD-10-CM | POA: Diagnosis not present

## 2018-12-27 DIAGNOSIS — S52531D Colles' fracture of right radius, subsequent encounter for closed fracture with routine healing: Secondary | ICD-10-CM | POA: Diagnosis not present

## 2018-12-28 DIAGNOSIS — M533 Sacrococcygeal disorders, not elsewhere classified: Secondary | ICD-10-CM | POA: Insufficient documentation

## 2018-12-28 DIAGNOSIS — M47816 Spondylosis without myelopathy or radiculopathy, lumbar region: Secondary | ICD-10-CM | POA: Insufficient documentation

## 2018-12-28 DIAGNOSIS — S32010A Wedge compression fracture of first lumbar vertebra, initial encounter for closed fracture: Secondary | ICD-10-CM | POA: Insufficient documentation

## 2018-12-28 DIAGNOSIS — M461 Sacroiliitis, not elsewhere classified: Secondary | ICD-10-CM | POA: Diagnosis not present

## 2018-12-28 DIAGNOSIS — M8000XA Age-related osteoporosis with current pathological fracture, unspecified site, initial encounter for fracture: Secondary | ICD-10-CM | POA: Diagnosis not present

## 2018-12-28 DIAGNOSIS — M545 Low back pain: Secondary | ICD-10-CM | POA: Diagnosis not present

## 2018-12-28 DIAGNOSIS — M5459 Other low back pain: Secondary | ICD-10-CM | POA: Insufficient documentation

## 2018-12-28 DIAGNOSIS — M48061 Spinal stenosis, lumbar region without neurogenic claudication: Secondary | ICD-10-CM | POA: Insufficient documentation

## 2018-12-28 DIAGNOSIS — Z79891 Long term (current) use of opiate analgesic: Secondary | ICD-10-CM | POA: Insufficient documentation

## 2019-01-03 ENCOUNTER — Other Ambulatory Visit: Payer: Self-pay | Admitting: Family Medicine

## 2019-01-26 DIAGNOSIS — S52531D Colles' fracture of right radius, subsequent encounter for closed fracture with routine healing: Secondary | ICD-10-CM | POA: Diagnosis not present

## 2019-02-09 DIAGNOSIS — M8000XA Age-related osteoporosis with current pathological fracture, unspecified site, initial encounter for fracture: Secondary | ICD-10-CM | POA: Diagnosis not present

## 2019-02-09 DIAGNOSIS — M545 Low back pain: Secondary | ICD-10-CM | POA: Diagnosis not present

## 2019-02-09 DIAGNOSIS — M533 Sacrococcygeal disorders, not elsewhere classified: Secondary | ICD-10-CM | POA: Diagnosis not present

## 2019-02-09 DIAGNOSIS — S32010D Wedge compression fracture of first lumbar vertebra, subsequent encounter for fracture with routine healing: Secondary | ICD-10-CM | POA: Diagnosis not present

## 2019-02-09 DIAGNOSIS — M47816 Spondylosis without myelopathy or radiculopathy, lumbar region: Secondary | ICD-10-CM | POA: Diagnosis not present

## 2019-03-09 ENCOUNTER — Telehealth: Payer: Self-pay

## 2019-03-09 NOTE — Telephone Encounter (Signed)
Opened in error

## 2019-03-19 ENCOUNTER — Other Ambulatory Visit: Payer: Self-pay | Admitting: *Deleted

## 2019-03-19 NOTE — Telephone Encounter (Signed)
Error

## 2019-03-21 ENCOUNTER — Other Ambulatory Visit: Payer: Self-pay | Admitting: *Deleted

## 2019-03-21 MED ORDER — SERTRALINE HCL 100 MG PO TABS: 100.0000 mg | ORAL_TABLET | Freq: Every day | ORAL | 1 refills | Status: DC

## 2019-04-02 ENCOUNTER — Telehealth: Payer: Self-pay | Admitting: Family Medicine

## 2019-04-02 ENCOUNTER — Encounter: Payer: Self-pay | Admitting: Family Medicine

## 2019-04-02 ENCOUNTER — Other Ambulatory Visit: Payer: Self-pay

## 2019-04-02 ENCOUNTER — Ambulatory Visit (INDEPENDENT_AMBULATORY_CARE_PROVIDER_SITE_OTHER): Payer: Medicare Other | Admitting: Family Medicine

## 2019-04-02 VITALS — BP 132/88 | HR 89 | Ht 60.0 in | Wt 142.0 lb

## 2019-04-02 DIAGNOSIS — F418 Other specified anxiety disorders: Secondary | ICD-10-CM | POA: Diagnosis not present

## 2019-04-02 DIAGNOSIS — R296 Repeated falls: Secondary | ICD-10-CM

## 2019-04-02 DIAGNOSIS — R2681 Unsteadiness on feet: Secondary | ICD-10-CM

## 2019-04-02 DIAGNOSIS — N3281 Overactive bladder: Secondary | ICD-10-CM

## 2019-04-02 DIAGNOSIS — F341 Dysthymic disorder: Secondary | ICD-10-CM

## 2019-04-02 DIAGNOSIS — I10 Essential (primary) hypertension: Secondary | ICD-10-CM

## 2019-04-02 DIAGNOSIS — G2581 Restless legs syndrome: Secondary | ICD-10-CM | POA: Diagnosis not present

## 2019-04-02 MED ORDER — OXYBUTYNIN CHLORIDE 5 MG PO TABS: 5.0000 mg | ORAL_TABLET | Freq: Two times a day (BID) | ORAL | 1 refills | Status: DC

## 2019-04-02 MED ORDER — VENLAFAXINE HCL ER 37.5 MG PO CP24: 37.5000 mg | ORAL_CAPSULE | Freq: Every day | ORAL | 0 refills | Status: DC

## 2019-04-02 MED ORDER — CARBIDOPA-LEVODOPA 25-100 MG PO TABS: 1.0000 | ORAL_TABLET | Freq: Every day | ORAL | 0 refills | Status: DC

## 2019-04-02 MED ORDER — ELETRIPTAN HYDROBROMIDE 20 MG PO TABS: 20.0000 mg | ORAL_TABLET | ORAL | 1 refills | Status: DC | PRN

## 2019-04-02 MED ORDER — ACYCLOVIR 400 MG PO TABS: 400.0000 mg | ORAL_TABLET | Freq: Every day | ORAL | 1 refills | Status: DC

## 2019-04-02 MED ORDER — AMLODIPINE BESYLATE 10 MG PO TABS: 10.0000 mg | ORAL_TABLET | Freq: Every day | ORAL | 1 refills | Status: DC

## 2019-04-02 MED ORDER — SERTRALINE HCL 25 MG PO TABS: ORAL_TABLET | ORAL | 0 refills | Status: DC

## 2019-04-02 MED ORDER — HYDROXYZINE HCL 25 MG PO TABS: ORAL_TABLET | ORAL | 1 refills | Status: DC

## 2019-04-02 NOTE — Assessment & Plan Note (Signed)
D/C ropinirole. Added to intolerance list. Will start carbido/levodopa.

## 2019-04-02 NOTE — Telephone Encounter (Signed)
Please call pt and let her know I sent the low dose zoloft. Follow instructions on the bottle and once done OK to start the new pill called Effexor.

## 2019-04-02 NOTE — Assessment & Plan Note (Signed)
I am referring her for gait instability training through formal physical therapy.  Offered to either schedule in the home or with PT here in our building and she would prefer to do PT in her building.  I think this could actually be really helpful for her and in the future we could even consider home assessment with a can walk through her home and make recommendations for changes that could lower her risk for an injury from a fall.

## 2019-04-02 NOTE — Telephone Encounter (Signed)
Called and informed pt of recommendations. No additional questions.Marland KitchenMarland KitchenElouise Wilson, [place]

## 2019-04-02 NOTE — Progress Notes (Signed)
Established Patient Office Visit  Subjective:  Patient ID: Nancy Wilson, female    DOB: Mar 24, 1941  Age: 78 y.o. MRN: [id number]  CC:  4 mo f/u Chief Complaint  Patient presents with  . Hypertension  . mood    HPI Nancy CasaBarbara F Dehaas presents for Hypertension- Pt denies chest pain, SOB, dizziness, or heart palpitations.  Taking meds as directed w/o problems.  Denies medication side effects.    F/u Mood -she is feels like her Zoloft is just really not working well.  She says she still just feels down and a little depressed.  She is not sure how much of it may be that she did recently fall and injure her right wrist and fractured it and required surgery and as well as fractured her back.  She knows that that is probably contributing and her husband does not really want her getting out and doing a lot he is afraid that she will fall again actually fell in her yard and that is how she initially injured her wrist and back..  She said she took Effexor years ago and felt like that may have worked better.  She is concerned that she has been falling more.  She says even before she fell and actually injured her wrist a couple of months ago she had actually fallen before that and had not injured herself.  She thinks that maybe her gait is a little unstable.  She says she is tried even using a cane but feels like that just makes it worse and she feels like she is more likely to fall.  Past Medical History:  Diagnosis Date  . Allergy   . Depression    long standing  . Hyperlipidemia   . Hypertension     Past Surgical History:  Procedure Laterality Date  . carpel tunnel release, both hands  1980's  . DILATION AND CURETTAGE OF UTERUS    . GASTROSCOPY  11-06  . trigger finger release, left finger    . TUBAL LIGATION     bilateral    Family History  Problem Relation Age of Onset  . Diabetes Mother   . Hyperlipidemia Mother   . Heart disease Mother 6321  . Hypertension Mother   . Diabetes  Father   . Hyperlipidemia Father   . Tremor Father        hand- started in his 4750's  . Hypertension Father   . Tremor Cousin     Social History   Socioeconomic History  . Marital status: Married    Spouse name: Geographical information systems officerroger  . Number of children: 2  . Years of education: 9th  . Highest education level: Not on file  Occupational History  . Occupation: Housewife  Social Needs  . Financial resource strain: Not hard at all  . Food insecurity    Worry: Never true    Inability: Never true  . Transportation needs    Medical: No    Non-medical: No  Tobacco Use  . Smoking status: Never Smoker  . Smokeless tobacco: Never Used  Substance and Sexual Activity  . Alcohol use: No    Alcohol/week: 0.0 standard drinks  . Drug use: No  . Sexual activity: Not Currently  Lifestyle  . Physical activity    Days per week: 0 days    Minutes per session: 0 min  . Stress: Only a little  Relationships  . Social connections    Talks on phone: More than three times a  week    Gets together: Once a week    Attends religious service: Never    Active member of club or organization: No    Attends meetings of clubs or organizations: Never    Relationship status: Married  . Intimate partner violence    Fear of current or ex partner: No    Emotionally abused: No    Physically abused: No    Forced sexual activity: No  Other Topics Concern  . Not on file  Social History Narrative   No caffeine use.    Outpatient Medications Prior to Visit  Medication Sig Dispense Refill  . atorvastatin (LIPITOR) 40 MG tablet Take 1 tablet (40 mg total) by mouth daily. 90 tablet 3  . buPROPion (WELLBUTRIN XL) 300 MG 24 hr tablet Take 1 tablet (300 mg total) by mouth daily. 90 tablet 3  . Cholecalciferol (VITAMIN D3) 2000 units capsule Take 2,000 Units by mouth daily.    . promethazine (PHENERGAN) 25 MG tablet Take 1 tablet (25 mg total) by mouth every 8 (eight) hours as needed for nausea or vomiting. 20 tablet 3  .  propranolol (INDERAL) 40 MG tablet Take 1 tablet (40 mg total) by mouth 2 (two) times daily. 180 tablet 3  . acyclovir (ZOVIRAX) 400 MG tablet Take 1 tablet (400 mg total) by mouth at bedtime. 90 tablet 1  . amLODipine (NORVASC) 10 MG tablet Take 1 tablet (10 mg total) by mouth daily. 90 tablet 1  . eletriptan (RELPAX) 20 MG tablet Take 1 tablet (20 mg total) by mouth as needed for migraine or headache. May repeat in 2 hours if headache persists or recurs. 10 tablet 1  . hydrOXYzine (ATARAX/VISTARIL) 25 MG tablet TAKE 1 TO 2 TABLETS (25-50 MG TOTAL) BY MOUTH AT BEDTIME AS NEEDED FOR ANXIETY. 90 tablet 1  . oxybutynin (DITROPAN) 5 MG tablet Take 1 tablet (5 mg total) by mouth 2 (two) times daily. 180 tablet 1  . sertraline (ZOLOFT) 100 MG tablet Take 1 tablet (100 mg total) by mouth at bedtime. 90 tablet 1  . rOPINIRole (REQUIP) 0.25 MG tablet TAKE 2 TABS NIGHTLY (Patient not taking: Reported on 04/02/2019) 180 tablet 1   No facility-administered medications prior to visit.     Allergies  Allergen Reactions  . Hydrocodone-Acetaminophen Hives  . Codeine Other (See Comments)  . Doxycycline   . Guaifenesin   . Meperidine Other (See Comments)  . Meperidine Hcl   . Metronidazole   . Morphine Other (See Comments)  . Penicillins   . Prednisone Other (See Comments)  . Propoxyphene Nausea And Vomiting  . Ropinirole Other (See Comments)    Sweaty and tachycardic    ROS Review of Systems    Objective:    Physical Exam  Constitutional: She is oriented to person, place, and time. She appears well-developed and well-nourished.  HENT:  Head: Normocephalic and atraumatic.  Cardiovascular: Normal rate, regular rhythm and normal heart sounds.  Pulmonary/Chest: Effort normal and breath sounds normal.  Neurological: She is alert and oriented to person, place, and time.  Skin: Skin is warm and dry.  Psychiatric: She has a normal mood and affect. Her behavior is normal.    BP 132/88   Pulse 89    Ht 5' (1.524 m)   Wt 142 lb (64.4 kg)   SpO2 96%   BMI 27.73 kg/m  Wt Readings from Last 3 Encounters:  04/02/19 142 lb (64.4 kg)  11/29/18 141 lb (64 kg)  09/21/18  137 lb (62.1 kg)     There are no preventive care reminders to display for this patient.  There are no preventive care reminders to display for this patient.  Lab Results  Component Value Date   TSH 2.88 11/29/2018   Lab Results  Component Value Date   WBC 6.2 11/29/2018   HGB 13.5 11/29/2018   HCT 40.0 11/29/2018   MCV 95.2 11/29/2018   PLT 244 11/29/2018   Lab Results  Component Value Date   NA 139 11/29/2018   K 4.5 11/29/2018   CO2 27 11/29/2018   GLUCOSE 91 11/29/2018   BUN 17 11/29/2018   CREATININE 1.22 (H) 11/29/2018   BILITOT 0.6 11/29/2018   ALKPHOS 92 12/02/2016   AST 16 11/29/2018   ALT 14 11/29/2018   PROT 6.5 11/29/2018   ALBUMIN 4.3 12/02/2016   CALCIUM 9.4 11/29/2018   Lab Results  Component Value Date   CHOL 231 (H) 11/29/2018   Lab Results  Component Value Date   HDL 61 11/29/2018   Lab Results  Component Value Date   LDLCALC 147 (H) 11/29/2018   Lab Results  Component Value Date   TRIG 115 11/29/2018   Lab Results  Component Value Date   CHOLHDL 3.8 11/29/2018   No results found for: HGBA1C    Assessment & Plan:   Problem List Items Addressed This Visit      Cardiovascular and Mediastinum   HYPERTENSION, BENIGN - Primary    Well controlled. Continue current regimen. Follow up in  6 months.       Relevant Medications   amLODipine (NORVASC) 10 MG tablet     Genitourinary   OAB (overactive bladder)   Relevant Medications   oxybutynin (DITROPAN) 5 MG tablet     Other   RLS (restless legs syndrome)    D/C ropinirole. Added to intolerance list. Will start carbido/levodopa.        Frequent falls    I am referring her for gait instability training through formal physical therapy.  Offered to either schedule in the home or with PT here in our building  and she would prefer to do PT in her building.  I think this could actually be really helpful for her and in the future we could even consider home assessment with a can walk through her home and make recommendations for changes that could lower her risk for an injury from a fall.      Relevant Orders   Ambulatory referral to Physical Therapy   Depression with anxiety   Relevant Medications   hydrOXYzine (ATARAX/VISTARIL) 25 MG tablet   sertraline (ZOLOFT) 25 MG tablet   venlafaxine XR (EFFEXOR XR) 37.5 MG 24 hr capsule   ANXIETY DEPRESSION    Really does not feel like the Zoloft is helping her.  We will wean the Zoloft off and switch to Effexor which she is taken before and felt like she did well on.  Plan to follow-up in 1 month.      Relevant Medications   hydrOXYzine (ATARAX/VISTARIL) 25 MG tablet   sertraline (ZOLOFT) 25 MG tablet   venlafaxine XR (EFFEXOR XR) 37.5 MG 24 hr capsule    Other Visit Diagnoses    Gait instability       Relevant Orders   Ambulatory referral to Physical Therapy      Meds ordered this encounter  Medications  . acyclovir (ZOVIRAX) 400 MG tablet    Sig: Take 1 tablet (400 mg total)  by mouth at bedtime.    Dispense:  90 tablet    Refill:  1  . amLODipine (NORVASC) 10 MG tablet    Sig: Take 1 tablet (10 mg total) by mouth daily.    Dispense:  90 tablet    Refill:  1  . eletriptan (RELPAX) 20 MG tablet    Sig: Take 1 tablet (20 mg total) by mouth as needed for migraine or headache. May repeat in 2 hours if headache persists or recurs.    Dispense:  10 tablet    Refill:  1  . hydrOXYzine (ATARAX/VISTARIL) 25 MG tablet    Sig: TAKE 1 TO 2 TABLETS (25-50 MG TOTAL) BY MOUTH AT BEDTIME AS NEEDED FOR ANXIETY.    Dispense:  90 tablet    Refill:  1  . oxybutynin (DITROPAN) 5 MG tablet    Sig: Take 1 tablet (5 mg total) by mouth 2 (two) times daily.    Dispense:  180 tablet    Refill:  1  . carbidopa-levodopa (SINEMET) 25-100 MG tablet    Sig: Take 1  tablet by mouth at bedtime.    Dispense:  30 tablet    Refill:  0  . sertraline (ZOLOFT) 25 MG tablet    Sig: 3 tabs po QD x 7 days, then 2 tabs po QD x 7 days, then 1 tab QD x 7 days.    Dispense:  42 tablet    Refill:  0  . venlafaxine XR (EFFEXOR XR) 37.5 MG 24 hr capsule    Sig: Take 1 capsule (37.5 mg total) by mouth daily with breakfast.    Dispense:  30 capsule    Refill:  0    Follow-up: Return in about 4 weeks (around 04/30/2019) for balance follow up and new restless leg pill.    Nani Gasseratherine Anu Stagner, MD

## 2019-04-02 NOTE — Progress Notes (Signed)
Pt reports that the Requip causes her to feel like she is standing in front of a furnace. She tried cutting it in half and this didn't help.Maryruth Eve, Lahoma Crocker, CMA

## 2019-04-02 NOTE — Assessment & Plan Note (Signed)
Well controlled. Continue current regimen. Follow up in  6 months.  

## 2019-04-02 NOTE — Assessment & Plan Note (Signed)
Really does not feel like the Zoloft is helping her.  We will wean the Zoloft off and switch to Effexor which she is taken before and felt like she did well on.  Plan to follow-up in 1 month.

## 2019-04-03 ENCOUNTER — Telehealth: Payer: Self-pay | Admitting: Family Medicine

## 2019-04-03 NOTE — Telephone Encounter (Signed)
Received fax from Coverm[employer]eds that Relpax requires a PA. Information has been sent to the insurance company. Awaiting determination.

## 2019-04-04 NOTE — Telephone Encounter (Signed)
Received a fax from insurance that Relpax has been approved through 09/20/2019. Pharmacy and patient aware. Form sent to scan.

## 2019-04-09 ENCOUNTER — Other Ambulatory Visit: Payer: Self-pay | Admitting: *Deleted

## 2019-04-09 MED ORDER — ELETRIPTAN HYDROBROMIDE 20 MG PO TABS: 20.0000 mg | ORAL_TABLET | ORAL | 4 refills | Status: DC | PRN

## 2019-04-13 DIAGNOSIS — M47816 Spondylosis without myelopathy or radiculopathy, lumbar region: Secondary | ICD-10-CM | POA: Diagnosis not present

## 2019-04-13 DIAGNOSIS — M8000XA Age-related osteoporosis with current pathological fracture, unspecified site, initial encounter for fracture: Secondary | ICD-10-CM | POA: Diagnosis not present

## 2019-04-13 DIAGNOSIS — S32010D Wedge compression fracture of first lumbar vertebra, subsequent encounter for fracture with routine healing: Secondary | ICD-10-CM | POA: Diagnosis not present

## 2019-04-13 DIAGNOSIS — G894 Chronic pain syndrome: Secondary | ICD-10-CM | POA: Diagnosis not present

## 2019-04-13 DIAGNOSIS — M545 Low back pain: Secondary | ICD-10-CM | POA: Diagnosis not present

## 2019-04-16 ENCOUNTER — Other Ambulatory Visit: Payer: Self-pay | Admitting: Family Medicine

## 2019-04-24 ENCOUNTER — Other Ambulatory Visit: Payer: Self-pay | Admitting: Family Medicine

## 2019-04-24 NOTE — Telephone Encounter (Signed)
Pt should have enough until her appt with Dr. Jerilynn Mages on 8/20.

## 2019-04-25 ENCOUNTER — Other Ambulatory Visit: Payer: Self-pay

## 2019-04-25 MED ORDER — CARBIDOPA-LEVODOPA 25-100 MG PO TABS: 1.0000 | ORAL_TABLET | Freq: Every day | ORAL | 0 refills | Status: DC

## 2019-05-03 ENCOUNTER — Ambulatory Visit: Payer: Medicare Other | Admitting: Family Medicine

## 2019-05-10 ENCOUNTER — Ambulatory Visit: Payer: Medicare Other | Admitting: Family Medicine

## 2019-05-20 ENCOUNTER — Other Ambulatory Visit: Payer: Self-pay | Admitting: Family Medicine

## 2019-05-30 ENCOUNTER — Ambulatory Visit: Payer: Medicare Other | Admitting: Family Medicine

## 2019-05-31 DIAGNOSIS — M654 Radial styloid tenosynovitis [de Quervain]: Secondary | ICD-10-CM | POA: Diagnosis not present

## 2019-05-31 DIAGNOSIS — R52 Pain, unspecified: Secondary | ICD-10-CM | POA: Diagnosis not present

## 2019-06-04 ENCOUNTER — Other Ambulatory Visit: Payer: Self-pay | Admitting: Family Medicine

## 2019-06-05 ENCOUNTER — Encounter: Payer: Self-pay | Admitting: Family Medicine

## 2019-06-05 ENCOUNTER — Ambulatory Visit (INDEPENDENT_AMBULATORY_CARE_PROVIDER_SITE_OTHER): Payer: Medicare Other | Admitting: Family Medicine

## 2019-06-05 ENCOUNTER — Other Ambulatory Visit: Payer: Self-pay

## 2019-06-05 VITALS — BP 138/64 | HR 90 | Ht 60.0 in | Wt 146.0 lb

## 2019-06-05 DIAGNOSIS — G2581 Restless legs syndrome: Secondary | ICD-10-CM | POA: Diagnosis not present

## 2019-06-05 DIAGNOSIS — R296 Repeated falls: Secondary | ICD-10-CM | POA: Diagnosis not present

## 2019-06-05 DIAGNOSIS — R2681 Unsteadiness on feet: Secondary | ICD-10-CM | POA: Insufficient documentation

## 2019-06-05 DIAGNOSIS — F418 Other specified anxiety disorders: Secondary | ICD-10-CM

## 2019-06-05 MED ORDER — PAROXETINE HCL 10 MG PO TABS: ORAL_TABLET | ORAL | 1 refills | Status: DC

## 2019-06-05 MED ORDER — PROMETHAZINE HCL 25 MG PO TABS: 25.0000 mg | ORAL_TABLET | Freq: Three times a day (TID) | ORAL | 3 refills | Status: DC | PRN

## 2019-06-05 NOTE — Assessment & Plan Note (Signed)
Discussed referral to physical therapy.  Normally I would recommend the balance and training classes at the [facility] but with COVID pandemic going on they are not currently offering these classes and I think she would do well in the physical therapy setting.  Then she can learn some things to do on her own at home and eventually get back to taking care of her flowers which she lives.

## 2019-06-05 NOTE — Assessment & Plan Note (Signed)
Continue current regimen.  He is happy with this regimen and feels like it is effective.

## 2019-06-05 NOTE — Assessment & Plan Note (Signed)
Discussed option. Will try paroxetine.  Added Effexor to intolerance list.  F/U in 1 months.

## 2019-06-05 NOTE — Patient Instructions (Addendum)
The best and safest medication for daily use for arthritis is Tylenol arthritis.  It can be taken up to 3 times a day, 1 tab each time.  It is much safer than Aleve and ibuprofen type products which can be hard on the kidneys and can increase your risk for bleeding.  Were going to try peroxide teen for your mood since it looks like you have not tried that one before.  I did place a referral to physical therapy for balance training.  You are welcome to walk down to the end of the hall to schedule an appointment or they can call you.

## 2019-06-05 NOTE — Progress Notes (Signed)
Established Patient Office Visit  Subjective:  Patient ID: Nancy Wilson, female    DOB: March 16, 1941  Age: 78 y.o. MRN: [id number]  CC:  Chief Complaint  Patient presents with  . Gait Problem    she has not had any falls this year.    HPI Nancy Wilson presents for   Follow-up balance issues-she still is having some balance issues but overall feels like she is getting a lot stronger.  Her husband does not want her out in the yard gardening because he is worried that she will fall and hurt her self.  Follow-up restless leg-he is currently on Sinemet IR at bedtime to help with restless leg and overall it seems to work well.  In fact we just refilled her medication yesterday.  Follow-up depression-we recently switched her from Zoloft to Effexor which she had previously taken.  She was just feeling really down and depressed and feeling very restricted and having to stay home after her recent fall and fractures. She started the Effexor but says her daughter reminded her that is the one that made her really tearful and ended up in the hospital.     Past Medical History:  Diagnosis Date  . Allergy   . Depression    long standing  . Hyperlipidemia   . Hypertension     Past Surgical History:  Procedure Laterality Date  . carpel tunnel release, both hands  1980's  . DILATION AND CURETTAGE OF UTERUS    . GASTROSCOPY  11-06  . trigger finger release, left finger    . TUBAL LIGATION     bilateral    Family History  Problem Relation Age of Onset  . Diabetes Mother   . Hyperlipidemia Mother   . Heart disease Mother 25  . Hypertension Mother   . Diabetes Father   . Hyperlipidemia Father   . Tremor Father        hand- started in his 85's  . Hypertension Father   . Tremor Cousin     Social History   Socioeconomic History  . Marital status: Married    Spouse name: Geographical information systems officer  . Number of children: 2  . Years of education: 9th  . Highest education level: Not on file   Occupational History  . Occupation: Housewife  Social Needs  . Financial resource strain: Not hard at all  . Food insecurity    Worry: Never true    Inability: Never true  . Transportation needs    Medical: No    Non-medical: No  Tobacco Use  . Smoking status: Never Smoker  . Smokeless tobacco: Never Used  Substance and Sexual Activity  . Alcohol use: No    Alcohol/week: 0.0 standard drinks  . Drug use: No  . Sexual activity: Not Currently  Lifestyle  . Physical activity    Days per week: 0 days    Minutes per session: 0 min  . Stress: Only a little  Relationships  . Social connections    Talks on phone: More than three times a week    Gets together: Once a week    Attends religious service: Never    Active member of club or organization: No    Attends meetings of clubs or organizations: Never    Relationship status: Married  . Intimate partner violence    Fear of current or ex partner: No    Emotionally abused: No    Physically abused: No    Forced sexual  activity: No  Other Topics Concern  . Not on file  Social History Narrative   No caffeine use.    Outpatient Medications Prior to Visit  Medication Sig Dispense Refill  . acyclovir (ZOVIRAX) 400 MG tablet Take 1 tablet (400 mg total) by mouth at bedtime. 90 tablet 1  . amLODipine (NORVASC) 10 MG tablet Take 1 tablet (10 mg total) by mouth daily. 90 tablet 1  . atorvastatin (LIPITOR) 40 MG tablet Take 1 tablet (40 mg total) by mouth daily. 90 tablet 3  . buPROPion (WELLBUTRIN XL) 300 MG 24 hr tablet Take 1 tablet (300 mg total) by mouth daily. 90 tablet 3  . carbidopa-levodopa (SINEMET IR) 25-100 MG tablet TAKE 1 TABLET BY MOUTH EVERYDAY AT BEDTIME 90 tablet 1  . Cholecalciferol (VITAMIN D3) 2000 units capsule Take 2,000 Units by mouth daily.    Marland Kitchen. eletriptan (RELPAX) 20 MG tablet Take 1 tablet (20 mg total) by mouth as needed for migraine or headache. May repeat in 2 hours if headache persists or recurs. 30 tablet  4  . oxybutynin (DITROPAN) 5 MG tablet Take 1 tablet (5 mg total) by mouth 2 (two) times daily. 180 tablet 1  . propranolol (INDERAL) 40 MG tablet Take 1 tablet (40 mg total) by mouth 2 (two) times daily. 180 tablet 3  . promethazine (PHENERGAN) 25 MG tablet Take 1 tablet (25 mg total) by mouth every 8 (eight) hours as needed for nausea or vomiting. 20 tablet 3  . hydrOXYzine (ATARAX/VISTARIL) 25 MG tablet TAKE 1 TO 2 TABLETS (25-50 MG TOTAL) BY MOUTH AT BEDTIME AS NEEDED FOR ANXIETY. 90 tablet 1  . sertraline (ZOLOFT) 25 MG tablet 3 tabs po QD x 7 days, then 2 tabs po QD x 7 days, then 1 tab QD x 7 days. 42 tablet 0  . venlafaxine XR (EFFEXOR-XR) 37.5 MG 24 hr capsule TAKE 1 CAPSULE (37.5 MG TOTAL) BY MOUTH DAILY WITH BREAKFAST. 90 capsule 1   No facility-administered medications prior to visit.     Allergies  Allergen Reactions  . Hydrocodone-Acetaminophen Hives  . Codeine Other (See Comments)  . Cymbalta [Duloxetine Hcl] Other (See Comments)  . Doxycycline   . Effexor [Venlafaxine] Other (See Comments)    Tearful  . Guaifenesin   . Meperidine Other (See Comments)  . Meperidine Hcl   . Metronidazole   . Morphine Other (See Comments)  . Penicillins   . Prednisone Other (See Comments)  . Propoxyphene Nausea And Vomiting  . Prozac [Fluoxetine Hcl] Other (See Comments)  . Ropinirole Other (See Comments)    Sweaty and tachycardic    ROS Review of Systems    Objective:    Physical Exam  BP 138/64   Pulse 90   Ht 5' (1.524 m)   Wt 146 lb (66.2 kg)   SpO2 98%   BMI 28.51 kg/m  Wt Readings from Last 3 Encounters:  06/05/19 146 lb (66.2 kg)  04/02/19 142 lb (64.4 kg)  11/29/18 141 lb (64 kg)     There are no preventive care reminders to display for this patient.  There are no preventive care reminders to display for this patient.  Lab Results  Component Value Date   TSH 2.88 11/29/2018   Lab Results  Component Value Date   WBC 6.2 11/29/2018   HGB 13.5  11/29/2018   HCT 40.0 11/29/2018   MCV 95.2 11/29/2018   PLT 244 11/29/2018   Lab Results  Component Value Date  NA 139 11/29/2018   K 4.5 11/29/2018   CO2 27 11/29/2018   GLUCOSE 91 11/29/2018   BUN 17 11/29/2018   CREATININE 1.22 (H) 11/29/2018   BILITOT 0.6 11/29/2018   ALKPHOS 92 12/02/2016   AST 16 11/29/2018   ALT 14 11/29/2018   PROT 6.5 11/29/2018   ALBUMIN 4.3 12/02/2016   CALCIUM 9.4 11/29/2018   Lab Results  Component Value Date   CHOL 231 (H) 11/29/2018   Lab Results  Component Value Date   HDL 61 11/29/2018   Lab Results  Component Value Date   LDLCALC 147 (H) 11/29/2018   Lab Results  Component Value Date   TRIG 115 11/29/2018   Lab Results  Component Value Date   CHOLHDL 3.8 11/29/2018   No results found for: HGBA1C    Assessment & Plan:   Problem List Items Addressed This Visit      Other   RLS (restless legs syndrome) - Primary    Continue current regimen.  He is happy with this regimen and feels like it is effective.      Gait instability    Discussed referral to physical therapy.  Normally I would recommend the balance and training classes at the [facility] but with COVID pandemic going on they are not currently offering these classes and I think she would do well in the physical therapy setting.  Then she can learn some things to do on her own at home and eventually get back to taking care of her flowers which she lives.      Relevant Orders   Ambulatory referral to Physical Therapy   Frequent falls   Relevant Orders   Ambulatory referral to Physical Therapy   Depression with anxiety    Discussed option. Will try paroxetine.  Added Effexor to intolerance list.  F/U in 1 months.        Relevant Medications   PARoxetine (PAXIL) 10 MG tablet      Meds ordered this encounter  Medications  . promethazine (PHENERGAN) 25 MG tablet    Sig: Take 1 tablet (25 mg total) by mouth every 8 (eight) hours as needed for nausea or vomiting.     Dispense:  20 tablet    Refill:  3  . PARoxetine (PAXIL) 10 MG tablet    Sig: 1/2 tab po QD x 6 days the increase to whole tab daily    Dispense:  30 tablet    Refill:  1    Follow-up: Return in about 4 weeks (around 07/03/2019) for New start medication/Paroxetine. Beatrice Lecher, MD

## 2019-06-12 ENCOUNTER — Ambulatory Visit: Payer: Medicare Other | Admitting: Physical Therapy

## 2019-06-19 ENCOUNTER — Ambulatory Visit: Payer: Medicare Other | Admitting: Physical Therapy

## 2019-07-02 ENCOUNTER — Other Ambulatory Visit: Payer: Self-pay

## 2019-07-02 ENCOUNTER — Encounter: Payer: Self-pay | Admitting: Physical Therapy

## 2019-07-02 ENCOUNTER — Ambulatory Visit: Payer: Medicare Other | Admitting: Physical Therapy

## 2019-07-02 DIAGNOSIS — R2681 Unsteadiness on feet: Secondary | ICD-10-CM | POA: Diagnosis not present

## 2019-07-02 NOTE — Therapy (Signed)
[facility] [place] [address] [place], [place], [address] Phone: [phone]   Fax:  [phone]  Physical Therapy Evaluation  Patient Details  Name: Nancy Wilson MRN: [id number] Date of Birth: 1941/07/11 Referring Provider (PT): Nani Gasseratherine Metheney   Encounter Date: 07/02/2019  PT End of Session - 07/02/19 1152    Visit Number  1    Number of Visits  12    Date for PT Re-Evaluation  08/13/19    PT Start Time  1152    PT Stop Time  1235    PT Time Calculation (min)  43 min    Activity Tolerance  Patient tolerated treatment well    Behavior During Therapy  [facility] for tasks assessed/performed       Past Medical History:  Diagnosis Date  . Allergy   . Depression    long standing  . Hyperlipidemia   . Hypertension     Past Surgical History:  Procedure Laterality Date  . carpel tunnel release, both hands  1980's  . DILATION AND CURETTAGE OF UTERUS    . GASTROSCOPY  11-06  . trigger finger release, left finger    . TUBAL LIGATION     bilateral    There were no vitals filed for this visit.   Subjective Assessment - 07/02/19 1157    Subjective  Patient has h/o of multiple falls as far back as January 2019, She fell in March and had subsequent surgery to repair fx in right arm. Patient seemed inistent that her last fall was in August even after I referenced chart.    Pertinent History  HTN, depression, multiple falls and BUE fx    Patient Stated Goals  decrease falls    Currently in Pain?  No/denies         [facility] PT Assessment - 07/02/19 0001      Assessment   Medical Diagnosis  frequent falls, gait instability    Referring Provider (PT)  Nani Gasseratherine Metheney    Onset Date/Surgical Date  12/11/18    Hand Dominance  Right    Next MD Visit  next week      Precautions   Precautions  Fall    Precaution Comments  MULTIPLE FALLS      Restrictions   Weight Bearing Restrictions  No      Balance Screen   Has the  patient fallen in the past 6 months  Yes    How many times?  1    Has the patient had a decrease in activity level because of a fear of falling?   Yes    Is the patient reluctant to leave their home because of a fear of falling?   No      Home Nurse, mental healthnvironment   Living Environment  Private residence    Living Arrangements  Spouse/significant other    Additional Comments  one level; 3 back steps with railing on both sides      Prior Function   Level of Independence  Independent with household mobility with device    Vocation  Retired      Observation/Other Assessments   Focus on Therapeutic Outcomes (FOTO)   BERG 45/56      Posture/Postural Control   Posture Comments  depressed left shoulder and scapula, tight left QL      ROM / Strength   AROM / PROM / Strength  AROM;Strength      AROM   Overall AROM Comments  lumbar  WNL tightness bil rotation      Strength   Overall Strength Comments  grossly 4+ to 5/5 except bil hip ADDuction 4-/5      Transfers   Five time sit to stand comments   21 sec      Ambulation/Gait   Ambulation/Gait  Yes    Ambulation/Gait Assistance  7: Independent    Ambulation Distance (Feet)  80 Feet    Assistive device  None    Gait Pattern  Lateral trunk lean to left;Wide base of support    Ambulation Surface  Level      Balance   Balance Assessed  Yes      Static Standing Balance   Static Standing - Balance Support  No upper extremity supported    Static Standing - Level of Assistance  5: Stand by assistance;7: Independent    Static Standing Balance -  Activities   Romberg - Eyes Opened;Romberg - Eyes Closed;Romberg - Eyes Opened, Foam;Romberg - Eyes Closed , Foam    Static Standing - Comment/# of Minutes  LOB on foam when looked upward      Standardized Balance Assessment   Standardized Balance Assessment  Berg Balance Test;Timed Up and Go Test;Five Times Sit to [employer] Test   Sit to Stand  Able to stand without using hands and  stabilize independently    Standing Unsupported  Able to stand safely 2 minutes    Sitting with Back Unsupported but Feet Supported on Floor or Stool  Able to sit safely and securely 2 minutes    Stand to Sit  Sits safely with minimal use of hands    Transfers  Able to transfer safely, minor use of hands    Standing Unsupported with Eyes Closed  Able to stand 10 seconds safely    Standing Unsupported with Feet Together  Able to place feet together independently and stand 1 minute safely    From Standing, Reach Forward with Outstretched Arm  Can reach forward >12 cm safely (5")    From Standing Position, Pick up Object from Floor  Able to pick up shoe safely and easily    From Standing Position, Turn to Look Behind Over each Shoulder  Looks behind from both sides and weight shifts well    Turn 360 Degrees  Able to turn 360 degrees safely but slowly   5 sec   Standing Unsupported, Alternately Place Feet on Step/Stool  Able to complete >2 steps/needs minimal assist   able to complete 8 steps in 20 seconds but unsteady   Standing Unsupported, One Foot in Front  Able to take small step independently and hold 30 seconds    Standing on One Leg  Tries to lift leg/unable to hold 3 seconds but remains standing independently    Total Score  45      Timed Up and Go Test   Normal TUG (seconds)  13.32    TUG Comments  no AD                Objective measurements completed on examination: See above findings.      [facility] Adult PT Treatment/Exercise - 07/02/19 0001      Exercises   Exercises  Knee/Hip      Knee/Hip Exercises: Stretches   Gastroc Stretch  Both;1 rep;20 seconds    Gastroc Stretch Limitations  feels soleus more    Soleus Stretch  Both;1 rep;20 seconds    Other  Knee/Hip Stretches  left QL stretch supine x 60 sec             PT Education - 07/02/19 1253    Education Details  Advised pt to use AD at all times. HEP: supine "reverse C" stretch for left QL tightness     Person(s) Educated  Patient    Methods  Explanation;Demonstration;Handout    Comprehension  Verbalized understanding;Returned demonstration          PT Long Term Goals - 07/02/19 1322      PT LONG TERM GOAL #1   Title  Ind with advanced HEP for balance    Time  6    Period  Weeks    Status  New    Target Date  08/13/19      PT LONG TERM GOAL #2   Title  Improved BERG score to 50/56 to decrease fall risk.    Time  6    Period  Weeks    Status  New      PT LONG TERM GOAL #3   Title  Patient to improve TUG to < 15 seconds to decrease fall risk.    Time  6    Period  Weeks    Status  New      PT LONG TERM GOAL #4   Title  Patient able to safely amb 300 feet indoor/outdoor using LRAD    Time  6    Period  Weeks    Status  New             Plan - 07/02/19 1254    Clinical Impression Statement  Patient presents in clinic after multiple falls in the past two years. She states that in 2019 she had over 20 falls fracturing her left forearm. She fell 12/11/18 and fractured her right forearm and L1 vertebra.  She reports that one fall in 2019 was witnessed by family and she just dropped. She denied dizziness.She has seen a neurologist. Pt scored 45/56 on the BERG and was advised to use a cane or walker at all times. She did not have her walker with her, but she states she uses it at home always. Her husband supports her in public or she uses a buggy at the store. She has difficulty with higher level balance activities and experienced LOB on foam when extending head from a flexed position. Vestibular testing was not completed today due to time, but should be assessed.    Personal Factors and Comorbidities  Age;Comorbidity 3+    Comorbidities  HTN, depression, multiple falls,  BUE fx, Lumbar fx, OP    Examination-Activity Limitations  Locomotion Level    Examination-Participation Restrictions  Community Activity;Yard Work    [employer] officer  Evolving/Moderate  complexity    Clinical Decision Making  Moderate    Rehab Potential  Good    PT Frequency  2x / week    PT Duration  6 weeks    PT Treatment/Interventions  ADLs/Self Care Home Management;Moist Heat;Gait training;Therapeutic exercise;Therapeutic activities;Balance training;Neuromuscular re-education;Patient/family education;Manual techniques;Vestibular    PT Next Visit Plan  assess vestibular; DGI; high level balance; monitor cognition    PT Home Exercise Plan  supine reverse C stretch for QL    Consulted and Agree with Plan of Care  Patient       Patient will benefit from skilled therapeutic intervention in order to improve the following deficits and impairments:  Abnormal gait, Decreased safety awareness, Decreased balance, Decreased  strength, Postural dysfunction, Impaired flexibility  Visit Diagnosis: Unsteadiness on feet - Plan: PT plan of care cert/re-cert     Problem List Patient Active Problem List   Diagnosis Date Noted  . Gait instability 06/05/2019  . Depression with anxiety 11/29/2018  . RLS (restless legs syndrome) 11/29/2018  . Frequent falls 09/21/2018  . OAB (overactive bladder) 05/30/2017  . Benign essential tremor 04/18/2017  . Posterior vitreous detachment of left eye 05/20/2016  . Trigger middle finger of right hand 12/01/2015  . Bilateral plantar fasciitis 08/09/2014  . Osteoarthritis of both ankles 08/09/2014  . S/P laparoscopic appendectomy 12/13/2011  . Night sweats 12/13/2011  . Herpes simplex type II infection 09/23/2011  . Knee pain 08/23/2011  . Anxiety 08/23/2011  . BENIGN POSITIONAL VERTIGO 04/24/2010  . Hyperlipidemia 03/28/2009  . FAMILIAL TREMOR 03/28/2009  . HERPES GENITALIS 04/03/2008  . Hypothyroidism 04/03/2008  . Vitamin D deficiency 04/03/2008  . HYPERTENSION, BENIGN 04/03/2008  . GERD 04/03/2008  . HIATAL HERNIA 04/03/2008  . DIVERTICULOSIS OF COLON 04/03/2008  . CKD (chronic kidney disease) stage 3, GFR 30-59 ml/min (HCC)  04/03/2008  . OSTEOARTHRITIS 04/03/2008  . OSTEOPENIA 04/03/2008  . EDEMA 04/03/2008  . Migraine headache without aura 04/03/2008    Solon Palm PT 07/02/2019, 1:31 PM  [facility] [address] [place], [place], [address] Phone: [phone]   Fax:  [phone]  Name: Nancy Wilson MRN: [id number] Date of Birth: 01/31/1941

## 2019-07-02 NOTE — Patient Instructions (Signed)
Side Stretch    Lie on your back with your shoulders to the left and hips to the right. Move your straight left leg to the left and then try to bring the right leg to meet it. Stop when you feel a stretch in the right low back. Hold 1-5 min. 1-2 x/day.  You DO NOT have to raise arm overhead as shown.  Nancy Wilson, PT 07/02/19 12:35 PM  [facility] [address], [place] [id number]  [phone] (office) [phone] (fax)

## 2019-07-03 ENCOUNTER — Ambulatory Visit: Payer: Medicare Other | Admitting: Family Medicine

## 2019-07-04 ENCOUNTER — Ambulatory Visit: Payer: Medicare HMO

## 2019-07-05 ENCOUNTER — Encounter: Payer: Medicare Other | Admitting: Physical Therapy

## 2019-07-09 ENCOUNTER — Ambulatory Visit: Payer: Medicare Other | Admitting: Physical Therapy

## 2019-07-09 ENCOUNTER — Encounter: Payer: Medicare Other | Admitting: Physical Therapy

## 2019-07-09 ENCOUNTER — Other Ambulatory Visit: Payer: Self-pay

## 2019-07-09 DIAGNOSIS — R2681 Unsteadiness on feet: Secondary | ICD-10-CM | POA: Diagnosis not present

## 2019-07-09 NOTE — Therapy (Signed)
[place], [place], [address] Phone: [phone]   Fax:  [phone]  Physical Therapy Treatment  Patient Details  Name: Nancy Wilson MRN: [id number] Date of Birth: 08/29/1941 Referring Provider (PT): Beatrice Lecher   Encounter Date: 07/09/2019  PT End of Session - 07/09/19 1406    Visit Number  2    Number of Visits  12    Date for PT Re-Evaluation  08/13/19    PT Start Time  1406    PT Stop Time  1452    PT Time Calculation (min)  46 min    Activity Tolerance  Patient tolerated treatment well    Behavior During Therapy  [facility] for tasks assessed/performed       Past Medical History:  Diagnosis Date  . Allergy   . Depression    long standing  . Hyperlipidemia   . Hypertension     Past Surgical History:  Procedure Laterality Date  . carpel tunnel release, both hands  1980's  . DILATION AND CURETTAGE OF UTERUS    . GASTROSCOPY  11-06  . trigger finger release, left finger    . TUBAL LIGATION     bilateral    There were no vitals filed for this visit.  Subjective Assessment - 07/09/19 1501    Subjective  Patient reports no falls since last visit. She is compliant with low back stretch.    Pertinent History  HTN, depression, multiple falls and BUE fx    Patient Stated Goals  decrease falls         [facility] PT Assessment - 07/09/19 0001      Home Environment   Living Arrangements  Spouse/significant other;Children    Additional Comments  also lives with 36 yr old daughter with Down's Syndrome      Standardized Balance Assessment   Standardized Balance Assessment  Dynamic Gait Index      Dynamic Gait Index   Level Surface  Normal    Change in Gait Speed  Mild Impairment    Gait with Horizontal Head Turns  Mild Impairment    Gait with Vertical Head Turns  Mild Impairment    Gait and Pivot Turn  Mild Impairment    Step Over Obstacle  Moderate Impairment    Step Around  Obstacles  Mild Impairment    Steps  Mild Impairment    Total Score  16                   [facility] Adult PT Treatment/Exercise - 07/09/19 0001      Knee/Hip Exercises: Stretches   Soleus Stretch  Both;1 rep;30 seconds    Soleus Stretch Limitations  standing      Knee/Hip Exercises: Standing   Hip Abduction  Stengthening;Both;2 sets;10 reps          Balance Exercises - 07/09/19 1504      Balance Exercises: Standing   Tandem Stance  Eyes open;2 reps;30 secs    SLS  Eyes open;Solid surface;Upper extremity support 1;5 reps;20 secs    Stepping Strategy  Foam/compliant surface;10 reps   on trampoline   Sidestepping  Other reps (comment)   10   Marching Limitations  on trampoline x 10 ea    Toe Raise Limitations  unable to do standing    Other Standing Exercises  braiding x 10; standing with toes on foam to work COG with head movements vertical and horizontal.  Balance Exercises: Seated   Toe Raise  20 reps;Both        PT Education - 07/09/19 1516    Education Details  HEP    Person(s) Educated  Patient    Methods  Explanation;Demonstration;Handout    Comprehension  Verbalized understanding;Returned demonstration          PT Long Term Goals - 07/02/19 1322      PT LONG TERM GOAL #1   Title  Ind with advanced HEP for balance    Time  6    Period  Weeks    Status  New    Target Date  08/13/19      PT LONG TERM GOAL #2   Title  Improved BERG score to 50/56 to decrease fall risk.    Time  6    Period  Weeks    Status  New      PT LONG TERM GOAL #3   Title  Patient to improve TUG to < 15 seconds to decrease fall risk.    Time  6    Period  Weeks    Status  New      PT LONG TERM GOAL #4   Title  Patient able to safely amb 300 feet indoor/outdoor using LRAD    Time  6    Period  Weeks    Status  New            Plan - 07/09/19 1516    Clinical Impression Statement  Patient presented today without AD. She did well with higher level  balance activities but was definitely challenged. She has greater weakness on LLE. Scored 16/24 on DGI. Unsteadiness doesn't appear vestibular at this time.  Cognitively WNL.    PT Frequency  2x / week    PT Duration  6 weeks    PT Treatment/Interventions  ADLs/Self Care Home Management;Moist Heat;Gait training;Therapeutic exercise;Therapeutic activities;Balance training;Neuromuscular re-education;Patient/family education;Manual techniques;Vestibular    PT Next Visit Plan  continue with higher level balance activities; COG; soleus stretching; monitor cognition; vestibular assessment if indicated    Consulted and Agree with Plan of Care  Patient       Patient will benefit from skilled therapeutic intervention in order to improve the following deficits and impairments:  Abnormal gait, Decreased safety awareness, Decreased balance, Decreased strength, Postural dysfunction, Impaired flexibility  Visit Diagnosis: Unsteadiness on feet     Problem List Patient Active Problem List   Diagnosis Date Noted  . Gait instability 06/05/2019  . Depression with anxiety 11/29/2018  . RLS (restless legs syndrome) 11/29/2018  . Frequent falls 09/21/2018  . OAB (overactive bladder) 05/30/2017  . Benign essential tremor 04/18/2017  . Posterior vitreous detachment of left eye 05/20/2016  . Trigger middle finger of right hand 12/01/2015  . Bilateral plantar fasciitis 08/09/2014  . Osteoarthritis of both ankles 08/09/2014  . S/P laparoscopic appendectomy 12/13/2011  . Night sweats 12/13/2011  . Herpes simplex type II infection 09/23/2011  . Knee pain 08/23/2011  . Anxiety 08/23/2011  . BENIGN POSITIONAL VERTIGO 04/24/2010  . Hyperlipidemia 03/28/2009  . FAMILIAL TREMOR 03/28/2009  . HERPES GENITALIS 04/03/2008  . Hypothyroidism 04/03/2008  . Vitamin D deficiency 04/03/2008  . HYPERTENSION, BENIGN 04/03/2008  . GERD 04/03/2008  . HIATAL HERNIA 04/03/2008  . DIVERTICULOSIS OF COLON 04/03/2008  .  CKD (chronic kidney disease) stage 3, GFR 30-59 ml/min (HCC) 04/03/2008  . OSTEOARTHRITIS 04/03/2008  . OSTEOPENIA 04/03/2008  . EDEMA 04/03/2008  . Migraine headache without  aura 04/03/2008   Solon Palm PT 07/09/2019, 3:21 PM  [facility] [address] [place], [place], [address] Phone: [phone]   Fax:  [phone]  Name: Nancy Wilson MRN: [id number] Date of Birth: April 08, 1941

## 2019-07-09 NOTE — Patient Instructions (Signed)
Access Code: [email or web address]  URL: [email or web address]  Date: 07/09/2019  Prepared by: Madelyn Flavors   Exercises Standing Heel Raise with Chair Support - 10 reps - 1 sets - 3 sec hold - 2x daily - 7x weekly Standing Single Leg Stance with Counter Support - 5 reps - 1 sets                   - max hold - 2x daily - 7x weekly Standing Hip Abduction with Counter Support - 10 reps - 1 sets - 5 sec hold hold - 2x daily - 7x weekly Seated Heel Toe Raises - 10 reps - 3 sets - 2x daily - 7x weekly Soleus Stretch on Wall - 1 reps - 2 sets - 30-60 sec hold - 2x daily - 7x weekly

## 2019-07-09 NOTE — Progress Notes (Deleted)
Subjective:   Nancy Wilson is a 78 y.o. female who presents for Medicare Annual (Subsequent) preventive examination.  Review of Systems:  No ROS.  Medicare Wellness Virtual Visit.  Visual/audio telehealth visit, UTA vital signs.   See social history for additional risk factors.      Sleep patterns:  Home Safety/Smoke Alarms: Feels safe in home. Smoke alarms in place.  Living environment; Seat Belt Safety/Bike Helmet: Wears seat belt.   Female:   Pap- Aged out      Mammo- Aged out      Dexa scan-        CCS- Aged out      Objective:     Vitals: There were no vitals taken for this visit.  There is no height or weight on file to calculate BMI.  Advanced Directives 07/02/2019 07/03/2018  Does Patient Have a Medical Advance Directive? Yes Yes  Type of Estate agentAdvance Directive Healthcare Power of [place];Living will Healthcare Power of [place];Living will  Does patient want to make changes to medical advance directive? No - Patient declined Yes (MAU/Ambulatory/Procedural Areas - Information given)  Copy of Healthcare Power of Attorney in Chart? No - copy requested No - copy requested  Would patient like information on creating a medical advance directive? - No - Patient declined    Tobacco Social History   Tobacco Use  Smoking Status Never Smoker  Smokeless Tobacco Never Used     Counseling given: Not Answered   Clinical Intake:                       Past Medical History:  Diagnosis Date  . Allergy   . Depression    long standing  . Hyperlipidemia   . Hypertension    Past Surgical History:  Procedure Laterality Date  . carpel tunnel release, both hands  1980's  . DILATION AND CURETTAGE OF UTERUS    . GASTROSCOPY  11-06  . trigger finger release, left finger    . TUBAL LIGATION     bilateral   Family History  Problem Relation Age of Onset  . Diabetes Mother   . Hyperlipidemia Mother   . Heart disease Mother 10521  . Hypertension Mother   .  Diabetes Father   . Hyperlipidemia Father   . Tremor Father        hand- started in his 2650's  . Hypertension Father   . Tremor Cousin    Social History   Socioeconomic History  . Marital status: Married    Spouse name: Geographical information systems officerroger  . Number of children: 2  . Years of education: 9th  . Highest education level: Not on file  Occupational History  . Occupation: Housewife  Social Needs  . Financial resource strain: Not hard at all  . Food insecurity    Worry: Never true    Inability: Never true  . Transportation needs    Medical: No    Non-medical: No  Tobacco Use  . Smoking status: Never Smoker  . Smokeless tobacco: Never Used  Substance and Sexual Activity  . Alcohol use: No    Alcohol/week: 0.0 standard drinks  . Drug use: No  . Sexual activity: Not Currently  Lifestyle  . Physical activity    Days per week: 0 days    Minutes per session: 0 min  . Stress: Only a little  Relationships  . Social connections    Talks on phone: More than three times a week  Gets together: Once a week    Attends religious service: Never    Active member of club or organization: No    Attends meetings of clubs or organizations: Never    Relationship status: Married  Other Topics Concern  . Not on file  Social History Narrative   No caffeine use.    Outpatient Encounter Medications as of 07/10/2019  Medication Sig  . acyclovir (ZOVIRAX) 400 MG tablet Take 1 tablet (400 mg total) by mouth at bedtime.  Marland Kitchen amLODipine (NORVASC) 10 MG tablet Take 1 tablet (10 mg total) by mouth daily.  Marland Kitchen atorvastatin (LIPITOR) 40 MG tablet Take 1 tablet (40 mg total) by mouth daily.  Marland Kitchen buPROPion (WELLBUTRIN XL) 300 MG 24 hr tablet Take 1 tablet (300 mg total) by mouth daily.  . carbidopa-levodopa (SINEMET IR) 25-100 MG tablet TAKE 1 TABLET BY MOUTH EVERYDAY AT BEDTIME  . Cholecalciferol (VITAMIN D3) 2000 units capsule Take 2,000 Units by mouth daily.  Marland Kitchen eletriptan (RELPAX) 20 MG tablet Take 1 tablet (20 mg  total) by mouth as needed for migraine or headache. May repeat in 2 hours if headache persists or recurs. (Patient not taking: Reported on 07/02/2019)  . oxybutynin (DITROPAN) 5 MG tablet Take 1 tablet (5 mg total) by mouth 2 (two) times daily.  Marland Kitchen PARoxetine (PAXIL) 10 MG tablet 1/2 tab po QD x 6 days the increase to whole tab daily (Patient not taking: Reported on 07/02/2019)  . promethazine (PHENERGAN) 25 MG tablet Take 1 tablet (25 mg total) by mouth every 8 (eight) hours as needed for nausea or vomiting.  . propranolol (INDERAL) 40 MG tablet Take 1 tablet (40 mg total) by mouth 2 (two) times daily.   No facility-administered encounter medications on file as of 07/10/2019.     Activities of Daily Living No flowsheet data found.  Patient Care Team: Agapito Games, MD as PCP - General    Assessment:   This is a routine wellness examination for [place].Physical assessment deferred to PCP.   Exercise Activities and Dietary recommendations   Diet Breakfast: Lunch:  Dinner:       Goals    . Exercise 3x per week (30 min per time)     Start exercise 3 times a week for 30 minutes a day. To help decompress from stress that day. Continue gardening       Fall Risk Fall Risk  06/05/2019 04/02/2019 07/03/2018 08/13/2014  Falls in the past year? 0 1 Yes Yes  Number falls in past yr: 0 1 2 or more 2 or more  Injury with Fall? 0 1 No -  Risk for fall due to : - Impaired balance/gait Impaired balance/gait Impaired balance/gait  Follow up - Falls prevention discussed Falls prevention discussed -   Is the patient's home free of loose throw rugs in walkways, pet beds, electrical cords, etc?   {Blank single:19197::"yes","no"}      Grab bars in the bathroom? {Blank single:19197::"yes","no"}      Handrails on the stairs?   {Blank single:19197::"yes","no"}      Adequate lighting?   {Blank single:19197::"yes","no"}   Depression Screen PHQ 2/9 Scores 06/05/2019 04/02/2019 11/29/2018  08/30/2018  PHQ - 2 Score 0 3 3 3   PHQ- 9 Score - 7 6 12      Cognitive Function     6CIT Screen 07/03/2018  What Year? 4 points  What month? 0 points  What time? 0 points  Count back from 20 0 points  Months in  reverse 2 points  Repeat phrase 0 points  Total Score 6    Immunization History  Administered Date(s) Administered  . Fluad Quad(high Dose 65+) 06/11/2019  . Influenza Whole 06/24/2009  . Influenza, High Dose Seasonal PF 08/23/2011, 05/02/2015, 07/30/2016  . Influenza, Quadrivalent, Recombinant, Inj, Pf 05/30/2017  . Influenza,inj,Quad PF,6+ Mos 07/03/2018  . Influenza-Unspecified 07/25/2014  . Pneumococcal Conjugate-13 09/22/2009, 08/13/2014, 07/26/2017  . Pneumococcal Polysaccharide-23 07/21/2008  . Td 09/21/2003  . Tdap 05/02/2015    Screening Tests Health Maintenance  Topic Date Due  . TETANUS/TDAP  05/01/2025  . INFLUENZA VACCINE  Completed  . DEXA SCAN  Completed  . PNA vac Low Risk Adult  Completed       Plan:   ***   I have personally reviewed and noted the following in the patient's chart:   . Medical and social history . Use of alcohol, tobacco or illicit drugs  . Current medications and supplements . Functional ability and status . Nutritional status . Physical activity . Advanced directives . List of other physicians . Hospitalizations, surgeries, and ER visits in previous 12 months . Vitals . Screenings to include cognitive, depression, and falls . Referrals and appointments  In addition, I have reviewed and discussed with patient certain preventive protocols, quality metrics, and best practice recommendations. A written personalized care plan for preventive services as well as general preventive health recommendations were provided to patient.     Joanne Chars, LPN  [id number]

## 2019-07-10 ENCOUNTER — Ambulatory Visit: Payer: Medicare Other

## 2019-07-11 ENCOUNTER — Ambulatory Visit: Payer: Medicare Other

## 2019-07-12 ENCOUNTER — Encounter: Payer: Medicare Other | Admitting: Physical Therapy

## 2019-07-17 ENCOUNTER — Other Ambulatory Visit: Payer: Self-pay

## 2019-07-17 ENCOUNTER — Ambulatory Visit (INDEPENDENT_AMBULATORY_CARE_PROVIDER_SITE_OTHER): Payer: Medicare Other | Admitting: Physical Therapy

## 2019-07-17 DIAGNOSIS — R2681 Unsteadiness on feet: Secondary | ICD-10-CM

## 2019-07-17 NOTE — Therapy (Signed)
[facility] [address] [place], [place], [address] Phone: [phone]   Fax:  [phone]  Physical Therapy Treatment  Patient Details  Name: Nancy Wilson MRN: [id number] Date of Birth: 01-21-1941 Referring Provider (PT): Nani Gasseratherine Metheney   Encounter Date: 07/17/2019  PT End of Session - 07/17/19 1109    Visit Number  3    Number of Visits  12    Date for PT Re-Evaluation  08/13/19    PT Start Time  1103    PT Stop Time  1150    PT Time Calculation (min)  47 min    Equipment Utilized During Treatment  Gait belt    Activity Tolerance  Patient tolerated treatment well    Behavior During Therapy  [facility] for tasks assessed/performed       Past Medical History:  Diagnosis Date  . Allergy   . Depression    long standing  . Hyperlipidemia   . Hypertension     Past Surgical History:  Procedure Laterality Date  . carpel tunnel release, both hands  1980's  . DILATION AND CURETTAGE OF UTERUS    . GASTROSCOPY  11-06  . trigger finger release, left finger    . TUBAL LIGATION     bilateral    There were no vitals filed for this visit.  Subjective Assessment - 07/17/19 1104    Subjective  Patient has fallen twice since her last visit. She did not seek medical treatment. she has brusing on right hand near thumb and left wrist near wrist. Both falls occurred when pt had been bending forward at the waist. She denies feeling dizzy. She "just went"    Pertinent History  HTN, depression, multiple falls and BUE fx    Patient Stated Goals  decrease falls    Currently in Pain?  No/denies         [facility] - 07/17/19 0001      AROM   Overall AROM Comments  Lumbar WFL      Palpation   Palpation comment  low back unremarkable      Special Tests   Other special tests  saccades; nystagmus with stationary vision and head turns                    [facility] Adult PT Treatment/Exercise - 07/17/19 0001       Knee/Hip Exercises: Stretches   Other Knee/Hip Stretches  left QL stretch supine 2 x 60 sec    Other Knee/Hip Stretches  SKTC 2x20 sec;       Knee/Hip Exercises: Standing   Knee Flexion  Both;2 sets;10 reps    Hip Abduction  Stengthening;Both;2 sets;10 reps    Hip Extension  Stengthening;Both;2 sets;10 reps;Knee straight      Knee/Hip Exercises: Seated   Sit to Sand  5 reps   with head flex and extension; no LOB     Knee/Hip Exercises: Supine   Bridges  Both;2 sets;5 reps    Bridges Limitations  painful in left           Balance Exercises - 07/17/19 1205      Balance Exercises: Standing   Standing Eyes Opened  Foam/compliant surface;30 secs;Narrow base of support (BOS);Wide (BOA)    Standing Eyes Closed  Narrow base of support (BOS);Wide (BOA);Foam/compliant surface;30 secs        PT Education - 07/17/19 1145    Education Details  HEP  PT Long Term Goals - 07/02/19 1322      PT LONG TERM GOAL #1   Title  Ind with advanced HEP for balance    Time  6    Period  Weeks    Status  New    Target Date  08/13/19      PT LONG TERM GOAL #2   Title  Improved BERG score to 50/56 to decrease fall risk.    Time  6    Period  Weeks    Status  New      PT LONG TERM GOAL #3   Title  Patient to improve TUG to < 15 seconds to decrease fall risk.    Time  6    Period  Weeks    Status  New      PT LONG TERM GOAL #4   Title  Patient able to safely amb 300 feet indoor/outdoor using LRAD    Time  6    Period  Weeks    Status  New            Plan - 07/17/19 1158    Clinical Impression Statement  Patient presents today reporting 2 falls since last visit, both which occurred when bending forward. She denies hitting her head, but had bruising to her right hand and left wrist. She denied pain intially but during treatment stated that her back has been hurting since the falls. She has tightness in her left QL as previously documented, but is not tender to the  touch and has lumbar ROM WFL. Basic back stretches were issued to HEP. Patient was able to perform balance activities on foam today without LOB but with CGA for safety. She experienced no LOB with repeated bending to standing activites. Appeared to have some nystagmus with stationary vision with head turns. Further vestibular testing to be done at next visit.    Comorbidities  HTN, depression, multiple falls,  BUE fx, Lumbar fx, OP    PT Frequency  2x / week    PT Duration  6 weeks    PT Treatment/Interventions  ADLs/Self Care Home Management;Moist Heat;Gait training;Therapeutic exercise;Therapeutic activities;Balance training;Neuromuscular re-education;Patient/family education;Manual techniques;Vestibular    PT Next Visit Plan  assess vestibular; continue with higher level balance activities; COG; soleus stretching; monitor cognition;       Patient will benefit from skilled therapeutic intervention in order to improve the following deficits and impairments:  Abnormal gait, Decreased safety awareness, Decreased balance, Decreased strength, Postural dysfunction, Impaired flexibility  Visit Diagnosis: Unsteadiness on feet     Problem List Patient Active Problem List   Diagnosis Date Noted  . Gait instability 06/05/2019  . Depression with anxiety 11/29/2018  . RLS (restless legs syndrome) 11/29/2018  . Frequent falls 09/21/2018  . OAB (overactive bladder) 05/30/2017  . Benign essential tremor 04/18/2017  . Posterior vitreous detachment of left eye 05/20/2016  . Trigger middle finger of right hand 12/01/2015  . Bilateral plantar fasciitis 08/09/2014  . Osteoarthritis of both ankles 08/09/2014  . S/P laparoscopic appendectomy 12/13/2011  . Night sweats 12/13/2011  . Herpes simplex type II infection 09/23/2011  . Knee pain 08/23/2011  . Anxiety 08/23/2011  . BENIGN POSITIONAL VERTIGO 04/24/2010  . Hyperlipidemia 03/28/2009  . FAMILIAL TREMOR 03/28/2009  . HERPES GENITALIS 04/03/2008   . Hypothyroidism 04/03/2008  . Vitamin D deficiency 04/03/2008  . HYPERTENSION, BENIGN 04/03/2008  . GERD 04/03/2008  . HIATAL HERNIA 04/03/2008  . DIVERTICULOSIS OF COLON 04/03/2008  .  CKD (chronic kidney disease) stage 3, GFR 30-59 ml/min (HCC) 04/03/2008  . OSTEOARTHRITIS 04/03/2008  . OSTEOPENIA 04/03/2008  . EDEMA 04/03/2008  . Migraine headache without aura 04/03/2008    Madelyn Flavors PT 07/17/2019, 12:08 PM  [facility] [address] [place], [place], [address] Phone: [phone]   Fax:  [phone]  Name: Nancy Wilson MRN: [id number] Date of Birth: 31-Jan-1941

## 2019-07-17 NOTE — Patient Instructions (Signed)
Bridge   Lie back, legs bent. Inhale, pressing hips up. Keeping ribs in, lengthen lower back. Exhale, rolling down along spine from top. Repeat 10-30 times. Do __2__ sessions per day.  Copyright  VHI. All rights reserved.  Abdominal bracing   Flatten back by tightening stomach muscles and buttocks. Hold 5-10 seconds. Repeat _10___ times per set. Do _1-3___ sets per session. Do __2__ sessions per day.  http://orth.exer.us/134   Copyright  VHI. All rights reserved. Knee to Chest (Flexion)   Pull knee toward chest. Feel stretch in lower back or buttock area. You may need to put the other leg straight to feel a better stretch. Breathing deeply, Hold __30__ seconds. Repeat with other knee. Repeat _3___ times each leg. Do _2___ sessions per day.    Lower Trunk Rotation Stretch   Keeping back flat and feet together, rotate knees to left side. Hold __10__ seconds. Repeat __5__ times per set. Do ____ sets per session. Do _2_ sessions per day.  http://orth.exer.us/122   Copyright  VHI. All rights reserved.  Supine: Leg Stretch With Strap (Basic)   Lie on back with one knee bent, foot flat on floor. Hook strap around other foot. Straighten knee. Keep knee level with other knee. Hold 30 - 60 seconds. Relax leg completely down to floor.  Repeat _3__ times per session. Do _2__ sessions per day.  Straight Leg Calf Stretch (Gastroc)   Put palms against wall, one leg forward and bent. With other leg back straight and heel flat on floor, lean into wall. Hold _30__ seconds. Change legs and repeat. Repeat _3___ times. Do __2__ sessions per day.

## 2019-07-18 ENCOUNTER — Ambulatory Visit (INDEPENDENT_AMBULATORY_CARE_PROVIDER_SITE_OTHER): Payer: Medicare Other | Admitting: *Deleted

## 2019-07-18 VITALS — Ht 60.0 in | Wt 141.0 lb

## 2019-07-18 DIAGNOSIS — Z13820 Encounter for screening for osteoporosis: Secondary | ICD-10-CM

## 2019-07-18 DIAGNOSIS — Z0000 Encounter for general adult medical examination without abnormal findings: Secondary | ICD-10-CM | POA: Diagnosis not present

## 2019-07-18 NOTE — Patient Instructions (Signed)
Ms. Shutt , Thank you for taking time to come for your Medicare Wellness Visit. I appreciate your ongoing commitment to your health goals. Please review the following plan we discussed and let me know if I can assist you in the future. Please schedule your next medicare wellness visit with me in 1 yr.  Continue doing brain stimulating activities (puzzles, reading, adult coloring books, staying active) to keep memory sharp.  These are the goals we discussed: Goals    . Exercise 3x per week (30 min per time)     Start exercise 3 times a week for 30 minutes a day. To help decompress from stress that day. Continue gardening    . Weight (lb) < 200 lb (90.7 kg)     Would like to loose 25lbs

## 2019-07-18 NOTE — Progress Notes (Signed)
Subjective:   Nancy Wilson is a 78 y.o. female who presents for Medicare Annual (Subsequent) preventive examination.  Review of Systems:  No ROS.  Medicare Wellness Virtual Visit.  Visual/audio telehealth visit, UTA vital signs.   See social history for additional risk factors.    Cardiac Risk Factors include: advanced age (>1955men, 11>65 women);hypertension;sedentary lifestyle Sleep patterns: Getting 6-8 hours of sleep a night. Wakes up 2-3 times a night to void. Wakes up and feels rested and ready for her day Home Safety/Smoke Alarms: Feels safe in home. Smoke alarms in place.  Living environment;Lives husband in a 1 story home and steps outside have hand rails on them. Shower is a walk in shower with grab bars in place and anti slip mat in place as well. Seat Belt Safety/Bike Helmet: Wears seat belt.   Female:   Pap- Aged out       Mammo-   Aged out    Dexa scan-  ordered      CCS-Aged out     Objective:     Vitals: There were no vitals taken for this visit.  There is no height or weight on file to calculate BMI.  Advanced Directives 07/18/2019 07/02/2019 07/03/2018  Does Patient Have a Medical Advance Directive? Yes Yes Yes  Type of Advance Directive - Healthcare Power of [place];Living will Healthcare Power of [place];Living will  Does patient want to make changes to medical advance directive? No - Patient declined No - Patient declined Yes (MAU/Ambulatory/Procedural Areas - Information given)  Copy of Healthcare Power of Attorney in Chart? No - copy requested No - copy requested No - copy requested  Would patient like information on creating a medical advance directive? - - No - Patient declined    Tobacco Social History   Tobacco Use  Smoking Status Never Smoker  Smokeless Tobacco Never Used     Counseling given: Not Answered   Clinical Intake:  Pre-visit preparation completed: Yes  Pain : No/denies pain     Nutritional Risks: None Diabetes: No  How  often do you need to have someone help you when you read instructions, pamphlets, or other written materials from your doctor or pharmacy?: 1 - Never What is the last grade level you completed in school?: 9th  Interpreter Needed?: No  Information entered by :: Carolin SicksKim Tramane Gorum, LPN  Past Medical History:  Diagnosis Date  . Allergy   . Depression    long standing  . Hyperlipidemia   . Hypertension    Past Surgical History:  Procedure Laterality Date  . carpel tunnel release, both hands  1980's  . DILATION AND CURETTAGE OF UTERUS    . FRACTURE SURGERY     fell and broke right arm  . GASTROSCOPY  11-06  . trigger finger release, left finger    . TUBAL LIGATION     bilateral   Family History  Problem Relation Age of Onset  . Diabetes Mother   . Hyperlipidemia Mother   . Heart disease Mother 3221  . Hypertension Mother   . Diabetes Father   . Hyperlipidemia Father   . Tremor Father        hand- started in his 4350's  . Hypertension Father   . Tremor Cousin    Social History   Socioeconomic History  . Marital status: Married    Spouse name: Geographical information systems officerroger  . Number of children: 2  . Years of education: 9th  . Highest education level: 9th grade  Occupational History  . Occupation: Housewife    Comment: retired  Scientific laboratory technician  . Financial resource strain: Not hard at all  . Food insecurity    Worry: Never true    Inability: Never true  . Transportation needs    Medical: No    Non-medical: No  Tobacco Use  . Smoking status: Never Smoker  . Smokeless tobacco: Never Used  Substance and Sexual Activity  . Alcohol use: No    Alcohol/week: 0.0 standard drinks  . Drug use: No  . Sexual activity: Not Currently  Lifestyle  . Physical activity    Days per week: 0 days    Minutes per session: 0 min  . Stress: Only a little  Relationships  . Social Herbalist on phone: Once a week    Gets together: Once a week    Attends religious service: Never    Active member of club  or organization: No    Attends meetings of clubs or organizations: Never    Relationship status: Married  Other Topics Concern  . Not on file  Social History Narrative   No caffeine use. Doing physical therapy to help with her balance issue    Outpatient Encounter Medications as of 07/18/2019  Medication Sig  . acyclovir (ZOVIRAX) 400 MG tablet Take 1 tablet (400 mg total) by mouth at bedtime.  Marland Kitchen amLODipine (NORVASC) 10 MG tablet Take 1 tablet (10 mg total) by mouth daily.  Marland Kitchen atorvastatin (LIPITOR) 40 MG tablet Take 1 tablet (40 mg total) by mouth daily.  Marland Kitchen buPROPion (WELLBUTRIN XL) 300 MG 24 hr tablet Take 1 tablet (300 mg total) by mouth daily.  . carbidopa-levodopa (SINEMET IR) 25-100 MG tablet TAKE 1 TABLET BY MOUTH EVERYDAY AT BEDTIME  . Cholecalciferol (VITAMIN D3) 2000 units capsule Take 2,000 Units by mouth daily.  Marland Kitchen eletriptan (RELPAX) 20 MG tablet Take 1 tablet (20 mg total) by mouth as needed for migraine or headache. May repeat in 2 hours if headache persists or recurs.  Marland Kitchen oxybutynin (DITROPAN) 5 MG tablet Take 1 tablet (5 mg total) by mouth 2 (two) times daily.  . promethazine (PHENERGAN) 25 MG tablet Take 1 tablet (25 mg total) by mouth every 8 (eight) hours as needed for nausea or vomiting.  . propranolol (INDERAL) 40 MG tablet Take 1 tablet (40 mg total) by mouth 2 (two) times daily.  Marland Kitchen PARoxetine (PAXIL) 10 MG tablet 1/2 tab po QD x 6 days the increase to whole tab daily (Patient not taking: Reported on 07/02/2019)   No facility-administered encounter medications on file as of 07/18/2019.     Activities of Daily Living In your present state of health, do you have any difficulty performing the following activities: 07/18/2019  Hearing? N  Vision? N  Difficulty concentrating or making decisions? Y  Comment sometimes forgets things  Walking or climbing stairs? N  Dressing or bathing? N  Doing errands, shopping? N  Preparing Food and eating ? N  Using the Toilet? N   In the past six months, have you accidently leaked urine? N  Do you have problems with loss of bowel control? N  Managing your Medications? N  Managing your Finances? N  Housekeeping or managing your Housekeeping? N  Some recent data might be hidden    Patient Care Team: Hali Marry, MD as PCP - General    Assessment:   This is a routine wellness examination for [place].Physical assessment deferred to PCP.  Exercise Activities and Dietary recommendations Current Exercise Habits: The patient does not participate in regular exercise at present, Exercise limited by: orthopedic condition(s);Other - see comments(balance issues) Diet Eats a healthy diet of fruits, vegetables and proteins. Breakfast: bacon, eggs, toast, sliced tomato and peaches Lunch: fish Dinner: Meat and vegetables Drinks 8 glasses of water daily.      Goals    . Exercise 3x per week (30 min per time)     Start exercise 3 times a week for 30 minutes a day. To help decompress from stress that day. Continue gardening    . Weight (lb) < 200 lb (90.7 kg)     Would like to loose 25lbs       Fall Risk Fall Risk  07/18/2019 06/05/2019 04/02/2019 07/03/2018 08/13/2014  Falls in the past year? 1 0 1 Yes Yes  Number falls in past yr: 1 0 1 2 or more 2 or more  Injury with Fall? 0 0 1 No -  Risk for fall due to : History of fall(s);Impaired balance/gait;Impaired mobility - Impaired balance/gait Impaired balance/gait Impaired balance/gait  Follow up Falls prevention discussed - Falls prevention discussed Falls prevention discussed -   Is the patient's home free of loose throw rugs in walkways, pet beds, electrical cords, etc?   yes      Grab bars in the bathroom? yes      Handrails on the stairs?   yes      Adequate lighting?   yes   Depression Screen PHQ 2/9 Scores 07/18/2019 06/05/2019 04/02/2019 11/29/2018  PHQ - 2 Score 0 0 3 3  PHQ- 9 Score - - 7 6     Cognitive Function     6CIT Screen 07/18/2019  07/03/2018  What Year? 0 points 4 points  What month? 0 points 0 points  What time? 0 points 0 points  Count back from 20 0 points 0 points  Months in reverse 0 points 2 points  Repeat phrase 0 points 0 points  Total Score 0 6    Immunization History  Administered Date(s) Administered  . Fluad Quad(high Dose 65+) 06/11/2019  . Influenza Whole 06/24/2009  . Influenza, High Dose Seasonal PF 08/23/2011, 05/02/2015, 07/30/2016  . Influenza, Quadrivalent, Recombinant, Inj, Pf 05/30/2017  . Influenza,inj,Quad PF,6+ Mos 07/03/2018  . Influenza-Unspecified 07/25/2014  . Pneumococcal Conjugate-13 09/22/2009, 08/13/2014, 07/26/2017  . Pneumococcal Polysaccharide-23 07/21/2008  . Td 09/21/2003  . Tdap 05/02/2015    Screening Tests Health Maintenance  Topic Date Due  . TETANUS/TDAP  05/01/2025  . INFLUENZA VACCINE  Completed  . DEXA SCAN  Completed  . PNA vac Low Risk Adult  Completed        Plan:    Ms. Sharrow , Thank you for taking time to come for your Medicare Wellness Visit. I appreciate your ongoing commitment to your health goals. Please review the following plan we discussed and let me know if I can assist you in the future. Please schedule your next medicare wellness visit with me in 1 yr.  Continue doing brain stimulating activities (puzzles, reading, adult coloring books, staying active) to keep memory sharp.    These are the goals we discussed: Goals    . Exercise 3x per week (30 min per time)     Start exercise 3 times a week for 30 minutes a day. To help decompress from stress that day. Continue gardening    . Weight (lb) < 200 lb (90.7 kg)  Would like to loose 25lbs       This is a list of the screening recommended for you and due dates:  Health Maintenance  Topic Date Due  . Tetanus Vaccine  05/01/2025  . Flu Shot  Completed  . DEXA scan (bone density measurement)  Completed  . Pneumonia vaccines  Completed     I have personally reviewed and noted  the following in the patient's chart:   . Medical and social history . Use of alcohol, tobacco or illicit drugs  . Current medications and supplements . Functional ability and status . Nutritional status . Physical activity . Advanced directives . List of other physicians . Hospitalizations, surgeries, and ER visits in previous 12 months . Vitals . Screenings to include cognitive, depression, and falls . Referrals and appointments  In addition, I have reviewed and discussed with patient certain preventive protocols, quality metrics, and best practice recommendations. A written personalized care plan for preventive services as well as general preventive health recommendations were provided to patient.     Normand Sloop, LPN  [id number]

## 2019-07-19 ENCOUNTER — Other Ambulatory Visit: Payer: Self-pay | Admitting: Family Medicine

## 2019-07-19 ENCOUNTER — Encounter: Payer: Medicare Other | Admitting: Physical Therapy

## 2019-07-19 DIAGNOSIS — Z1231 Encounter for screening mammogram for malignant neoplasm of breast: Secondary | ICD-10-CM

## 2019-07-21 ENCOUNTER — Other Ambulatory Visit: Payer: Self-pay | Admitting: Family Medicine

## 2019-07-23 ENCOUNTER — Encounter: Payer: Self-pay | Admitting: Family Medicine

## 2019-07-23 ENCOUNTER — Other Ambulatory Visit: Payer: Self-pay

## 2019-07-23 ENCOUNTER — Ambulatory Visit (INDEPENDENT_AMBULATORY_CARE_PROVIDER_SITE_OTHER): Payer: Medicare Other | Admitting: Family Medicine

## 2019-07-23 VITALS — BP 138/57 | HR 86 | Ht 60.0 in | Wt 145.0 lb

## 2019-07-23 DIAGNOSIS — F341 Dysthymic disorder: Secondary | ICD-10-CM | POA: Diagnosis not present

## 2019-07-23 DIAGNOSIS — F418 Other specified anxiety disorders: Secondary | ICD-10-CM

## 2019-07-23 DIAGNOSIS — I10 Essential (primary) hypertension: Secondary | ICD-10-CM

## 2019-07-23 MED ORDER — SERTRALINE HCL 50 MG PO TABS: ORAL_TABLET | ORAL | 0 refills | Status: DC

## 2019-07-23 NOTE — Assessment & Plan Note (Addendum)
She would like to go back on Zoloft which I think is perfectly reasonable.  I just take that she waited until her follow-up appointment to let us know that she decided not to take the Paxil.  We will go ahead and restart Zoloft explained that because she has been off of it for several months where we will will need to start with a taper.  We will get her up to 150 mg.  And then see her back in 2 months.  New prescription sent to local pharmacy and if she is tolerating it well then we can send a new prescription to mail order.  Continue with Wellbutrin.

## 2019-07-23 NOTE — Progress Notes (Signed)
Established Patient Office Visit  Subjective:  Patient ID: Nancy Wilson, female    DOB: 09-Mar-1941  Age: 78 y.o. MRN: [id number]  CC:  Chief Complaint  Patient presents with  . mood    she never started the Paxil. she said that her husband told her that she shouldn't take it because it has to many side effects. she said that she has been really depressed and crying a lot     HPI Nancy Wilson presents for f/u for mood.  I last saw her about 6 weeks ago for depression.  We have recently switched her from Zoloft to Effexor which she had taken previously.  She says that her daughter reminded her that that was the medication that "made her end up in the hospital" so she stopped it.  We have decided on maybe trying Paxil.  Her husband convinced her that there were too many side effects and so she never started the medication.  She would like to go back on the Zoloft and maybe try a higher dose.    Hypertension- Pt denies chest pain, SOB, dizziness, or heart palpitations.  Taking meds as directed w/o problems.  Denies medication side effects.      Past Medical History:  Diagnosis Date  . Allergy   . Depression    long standing  . Hyperlipidemia   . Hypertension     Past Surgical History:  Procedure Laterality Date  . carpel tunnel release, both hands  1980's  . DILATION AND CURETTAGE OF UTERUS    . FRACTURE SURGERY     fell and broke right arm  . GASTROSCOPY  11-06  . trigger finger release, left finger    . TUBAL LIGATION     bilateral    Family History  Problem Relation Age of Onset  . Diabetes Mother   . Hyperlipidemia Mother   . Heart disease Mother 72  . Hypertension Mother   . Diabetes Father   . Hyperlipidemia Father   . Tremor Father        hand- started in his 84's  . Hypertension Father   . Tremor Cousin     Social History   Socioeconomic History  . Marital status: Married    Spouse name: Geographical information systems officer  . Number of children: 2  . Years of education: 9th   . Highest education level: 9th grade  Occupational History  . Occupation: Housewife    Comment: retired  Engineer, production  . Financial resource strain: Not hard at all  . Food insecurity    Worry: Never true    Inability: Never true  . Transportation needs    Medical: No    Non-medical: No  Tobacco Use  . Smoking status: Never Smoker  . Smokeless tobacco: Never Used  Substance and Sexual Activity  . Alcohol use: No    Alcohol/week: 0.0 standard drinks  . Drug use: No  . Sexual activity: Not Currently  Lifestyle  . Physical activity    Days per week: 0 days    Minutes per session: 0 min  . Stress: Only a little  Relationships  . Social Musician on phone: Once a week    Gets together: Once a week    Attends religious service: Never    Active member of club or organization: No    Attends meetings of clubs or organizations: Never    Relationship status: Married  . Intimate partner violence  Fear of current or ex partner: No    Emotionally abused: No    Physically abused: No    Forced sexual activity: No  Other Topics Concern  . Not on file  Social History Narrative   No caffeine use. Doing physical therapy to help with her balance issue    Outpatient Medications Prior to Visit  Medication Sig Dispense Refill  . acyclovir (ZOVIRAX) 400 MG tablet Take 1 tablet (400 mg total) by mouth at bedtime. 90 tablet 1  . amLODipine (NORVASC) 10 MG tablet Take 1 tablet (10 mg total) by mouth daily. 90 tablet 1  . atorvastatin (LIPITOR) 40 MG tablet Take 1 tablet (40 mg total) by mouth daily. 90 tablet 3  . buPROPion (WELLBUTRIN XL) 300 MG 24 hr tablet Take 1 tablet (300 mg total) by mouth daily. 90 tablet 3  . carbidopa-levodopa (SINEMET IR) 25-100 MG tablet TAKE 1 TABLET BY MOUTH EVERYDAY AT BEDTIME 90 tablet 1  . Cholecalciferol (VITAMIN D3) 2000 units capsule Take 2,000 Units by mouth daily.    Marland Kitchen eletriptan (RELPAX) 20 MG tablet Take 1 tablet (20 mg total) by mouth as  needed for migraine or headache. May repeat in 2 hours if headache persists or recurs. 30 tablet 4  . oxybutynin (DITROPAN) 5 MG tablet Take 1 tablet (5 mg total) by mouth 2 (two) times daily. 180 tablet 1  . promethazine (PHENERGAN) 25 MG tablet Take 1 tablet (25 mg total) by mouth every 8 (eight) hours as needed for nausea or vomiting. 20 tablet 3  . propranolol (INDERAL) 40 MG tablet Take 1 tablet (40 mg total) by mouth 2 (two) times daily. 180 tablet 3  . PARoxetine (PAXIL) 10 MG tablet 1/2 tab po QD x 6 days the increase to whole tab daily (Patient not taking: Reported on 07/02/2019) 30 tablet 1   No facility-administered medications prior to visit.     Allergies  Allergen Reactions  . Hydrocodone-Acetaminophen Hives  . Codeine Other (See Comments)  . Cymbalta [Duloxetine Hcl] Other (See Comments)  . Doxycycline   . Effexor [Venlafaxine] Other (See Comments)    Tearful  . Guaifenesin   . Meperidine Other (See Comments)  . Meperidine Hcl   . Metronidazole   . Morphine Other (See Comments)  . Penicillins   . Prednisone Other (See Comments)  . Propoxyphene Nausea And Vomiting  . Prozac [Fluoxetine Hcl] Other (See Comments)  . Ropinirole Other (See Comments)    Sweaty and tachycardic    ROS Review of Systems    Objective:    Physical Exam  Constitutional: She is oriented to person, place, and time. She appears well-developed and well-nourished.  HENT:  Head: Normocephalic and atraumatic.  Cardiovascular: Normal rate, regular rhythm and normal heart sounds.  Pulmonary/Chest: Effort normal and breath sounds normal.  Neurological: She is alert and oriented to person, place, and time.  Skin: Skin is warm and dry.  Psychiatric: She has a normal mood and affect. Her behavior is normal.    BP (!) 138/57   Pulse 86   Ht 5' (1.524 m)   Wt 145 lb (65.8 kg)   SpO2 100%   BMI 28.32 kg/m  Wt Readings from Last 3 Encounters:  07/23/19 145 lb (65.8 kg)  07/18/19 141 lb (64  kg)  06/05/19 146 lb (66.2 kg)     There are no preventive care reminders to display for this patient.  There are no preventive care reminders to display for this patient.  Lab Results  Component Value Date   TSH 2.88 11/29/2018   Lab Results  Component Value Date   WBC 6.2 11/29/2018   HGB 13.5 11/29/2018   HCT 40.0 11/29/2018   MCV 95.2 11/29/2018   PLT 244 11/29/2018   Lab Results  Component Value Date   NA 139 11/29/2018   K 4.5 11/29/2018   CO2 27 11/29/2018   GLUCOSE 91 11/29/2018   BUN 17 11/29/2018   CREATININE 1.22 (H) 11/29/2018   BILITOT 0.6 11/29/2018   ALKPHOS 92 12/02/2016   AST 16 11/29/2018   ALT 14 11/29/2018   PROT 6.5 11/29/2018   ALBUMIN 4.3 12/02/2016   CALCIUM 9.4 11/29/2018   Lab Results  Component Value Date   CHOL 231 (H) 11/29/2018   Lab Results  Component Value Date   HDL 61 11/29/2018   Lab Results  Component Value Date   LDLCALC 147 (H) 11/29/2018   Lab Results  Component Value Date   TRIG 115 11/29/2018   Lab Results  Component Value Date   CHOLHDL 3.8 11/29/2018   No results found for: HGBA1C    Assessment & Plan:   Problem List Items Addressed This Visit      Cardiovascular and Mediastinum   HYPERTENSION, BENIGN    Repeat blood pressure looks better today.  We will continue to keep an eye on this.        Other   Depression with anxiety    She would like to go back on Zoloft which I think is perfectly reasonable.  I just take that she waited until her follow-up appointment to let us know that she decided not to take the Paxil.  We will go ahead and restart Zoloft explained that because she has been off of it for several months where we will will need to start with a taper.  We will get her up to 150 mg.  And then see her back in 2 months.  New prescription sent to local pharmacy and if she is tolerating it well then we can send a new prescription to mail order.  Continue with Wellbutrin.      Relevant  Medications   sertraline (ZOLOFT) 50 MG tablet    Other Visit Diagnoses    ANXIETY DEPRESSION    -  Primary   Relevant Medications   sertraline (ZOLOFT) 50 MG tablet      Meds ordered this encounter  Medications  . sertraline (ZOLOFT) 50 MG tablet    Sig: 1/2 tab PO QD x 6 days then increase to whole tab QD x 6 days, then increase to 2 tabs QD x 10 days, then increase to 3 tabs daily.    Dispense:  80 tablet    Refill:  0    Follow-up: Return in about 2 months (around 09/22/2019) for Mood.    Nani Gasseratherine Jenefer Woerner, MD

## 2019-07-23 NOTE — Assessment & Plan Note (Signed)
Repeat blood pressure looks better today.  We will continue to keep an eye on this.

## 2019-07-25 ENCOUNTER — Encounter: Payer: Medicare Other | Admitting: Rehabilitative and Restorative Service Providers"

## 2019-08-03 ENCOUNTER — Ambulatory Visit: Payer: Medicare Other | Admitting: Rehabilitative and Restorative Service Providers"

## 2019-08-03 ENCOUNTER — Encounter: Payer: Self-pay | Admitting: Rehabilitative and Restorative Service Providers"

## 2019-08-03 ENCOUNTER — Other Ambulatory Visit: Payer: Self-pay

## 2019-08-03 DIAGNOSIS — R2681 Unsteadiness on feet: Secondary | ICD-10-CM | POA: Diagnosis not present

## 2019-08-03 NOTE — Therapy (Signed)
[facility] [place] [address] [place], [place], [address] Phone: [phone]   Fax:  [phone]  Physical Therapy Treatment  Patient Details  Name: Nancy Wilson MRN: [id number] Date of Birth: 07-27-1941 Referring Provider (PT): Nani Gasser   Encounter Date: 08/03/2019  PT End of Session - 08/03/19 1453    Visit Number  4    Number of Visits  12    Date for PT Re-Evaluation  08/13/19    PT Start Time  1455    PT Stop Time  1535    PT Time Calculation (min)  40 min    Equipment Utilized During Treatment  Gait belt    Activity Tolerance  Patient tolerated treatment well    Behavior During Therapy  [facility] for tasks assessed/performed       Past Medical History:  Diagnosis Date  . Allergy   . Depression    long standing  . Hyperlipidemia   . Hypertension     Past Surgical History:  Procedure Laterality Date  . carpel tunnel release, both hands  1980's  . DILATION AND CURETTAGE OF UTERUS    . FRACTURE SURGERY     fell and broke right arm  . GASTROSCOPY  11-06  . trigger finger release, left finger    . TUBAL LIGATION     bilateral    There were no vitals filed for this visit.  Subjective Assessment - 08/03/19 1451    Subjective  No new falls.  She denies dizziness and reports "just lose balance and down I go."  Onset was last year with 20+ falls, reports 2+ falls this year (more has been documented per notes than 2 at this time).    Pertinent History  HTN, depression, multiple falls and BUE fx ; adds h/o migraines in the past (none recently) gets HA approximately 2 per month    Patient Stated Goals  decrease falls    Currently in Pain?  No/denies             Vestibular Assessment - 08/03/19 1458      Vestibular Assessment   General Observation  Walks into clinic without a device independently.      Oculomotor Exam   Oculomotor Alignment  Normal    Spontaneous  Absent    Gaze-induced   Absent     Smooth Pursuits  Saccades    Saccades  Overshoots   to the right side and corrects to target     Vestibulo-Ocular Reflex   VOR 1 Head Only (x 1 viewing)  Slow VOR x 1 x 5 reps no dizziness    Comment  head impulse test is positive bilaterally for refixation saccade, when patient performs seated gaze x 1 viewing, she does not report visual blurring however has observable refixation saccade.               [facility] Adult PT Treatment/Exercise - 08/03/19 1454      Transfers   Transfers  Sit to Stand    Comments  Performed 10 reps sit<>stand then added ball overhead to engage core.      Ambulation/Gait   Ambulation/Gait  Yes    Ambulation/Gait Assistance  5: Supervision;6: Modified independent (Device/Increase time)    Ambulation/Gait Assistance Details  PT provided tactile cues for arm swing and to reduce guarding in trunk/UEs with gait, verbal cues for longer stride length.    Ambulation Distance (Feet)  400 Feet    Assistive device  None    Gait Comments  Dynamic gait activities including forward/backwards walking, walking with ball toss with CGA, marching, dual tasking with ball toss.      Neuro Re-ed    Neuro Re-ed Details   Standing balance activities performing feet narrow with head turns horizontal/vertical planes, foam standing with eyes open and eyes closed with wide base narrowing base of support with intermittent support from wall and CGA to min A.  Performed "rocker" standing on 1/2 foam roll working on wall bumps and finding midline without somatosensory feedback with min A (assist to get safely up/down from foam).    Step downs from 2" foam surface with CGA.      Vestibular Treatment/Exercise - 08/03/19 1552      Vestibular Treatment/Exercise   Vestibular Treatment Provided  Gaze    Gaze Exercises  X1 Viewing Horizontal      X1 Viewing Horizontal   Foot Position  seated> progressed to standing    Comments  30 seconds with cues for gaze fixation to target; some  evidence of correction to target, however no visual blurring reported by patient                 PT Long Term Goals - 07/02/19 1322      PT LONG TERM GOAL #1   Title  Ind with advanced HEP for balance    Time  6    Period  Weeks    Status  New    Target Date  08/13/19      PT LONG TERM GOAL #2   Title  Improved BERG score to 50/56 to decrease fall risk.    Time  6    Period  Weeks    Status  New      PT LONG TERM GOAL #3   Title  Patient to improve TUG to < 15 seconds to decrease fall risk.    Time  6    Period  Weeks    Status  New      PT LONG TERM GOAL #4   Title  Patient able to safely amb 300 feet indoor/outdoor using LRAD    Time  6    Period  Weeks    Status  New            Plan - 08/03/19 1553    Clinical Impression Statement  The patient has a subtle L lateral lean, which is magnified during  compliant surface standing.  The patient has loss of balance with eyes closed and has difficulty righting to midline with rocker surface.  She does not have true dizziness, however may benefit from exercises to stimulate vestibular inputs for balance due to loss of balance in multi-sensory conditions.    PT Treatment/Interventions  ADLs/Self Care Home Management;Moist Heat;Gait training;Therapeutic exercise;Therapeutic activities;Balance training;Neuromuscular re-education;Patient/family education;Manual techniques;Vestibular    PT Next Visit Plan  Multi-sensory balance training (foam, eyes closed, head motion, add multi-tasking), dynamic gait, stepping strategies to recover from Loss of balance, add balance activities in corner ot HEP (may consolidate all HEP)    PT Home Exercise Plan  supine reverse C stretch for QL    Consulted and Agree with Plan of Care  Patient       Patient will benefit from skilled therapeutic intervention in order to improve the following deficits and impairments:     Visit Diagnosis: Unsteadiness on feet     Problem  List Patient Active Problem List   Diagnosis Date  Noted  . Gait instability 06/05/2019  . Sacroiliac joint pain 12/28/2018  . Lumbar facet joint pain 12/28/2018  . Encounter for long-term use of opiate analgesic 12/28/2018  . Closed compression fracture of L1 lumbar vertebra, initial encounter (HCC) 12/28/2018  . Age-related osteoporosis with current pathological fracture 12/28/2018  . Lumbar foraminal stenosis 12/28/2018  . Wrist fracture, closed, right, initial encounter 12/13/2018  . Depression with anxiety 11/29/2018  . RLS (restless legs syndrome) 11/29/2018  . Frequent falls 09/21/2018  . OAB (overactive bladder) 05/30/2017  . Benign essential tremor 04/18/2017  . Posterior vitreous detachment of left eye 05/20/2016  . Trigger middle finger of right hand 12/01/2015  . Bilateral plantar fasciitis 08/09/2014  . Osteoarthritis of both ankles 08/09/2014  . S/P laparoscopic appendectomy 12/13/2011  . Night sweats 12/13/2011  . Herpes simplex type II infection 09/23/2011  . Knee pain 08/23/2011  . Anxiety 08/23/2011  . BENIGN POSITIONAL VERTIGO 04/24/2010  . Hyperlipidemia 03/28/2009  . FAMILIAL TREMOR 03/28/2009  . HERPES GENITALIS 04/03/2008  . Hypothyroidism 04/03/2008  . Vitamin D deficiency 04/03/2008  . HYPERTENSION, BENIGN 04/03/2008  . GERD 04/03/2008  . HIATAL HERNIA 04/03/2008  . DIVERTICULOSIS OF COLON 04/03/2008  . CKD (chronic kidney disease) stage 3, GFR 30-59 ml/min (HCC) 04/03/2008  . OSTEOARTHRITIS 04/03/2008  . OSTEOPENIA 04/03/2008  . EDEMA 04/03/2008  . Migraine headache without aura 04/03/2008    Martie Fulgham , PT 08/03/2019, 3:56 PM  [facility] [address] [place], [place], [address] Phone: [phone]   Fax:  [phone]  Name: Louie CasaBarbara F Khouri MRN: [id number] Date of Birth: 01/17/1941

## 2019-08-08 ENCOUNTER — Encounter: Payer: Medicare Other | Admitting: Physical Therapy

## 2019-08-09 DIAGNOSIS — Z09 Encounter for follow-up examination after completed treatment for conditions other than malignant neoplasm: Secondary | ICD-10-CM | POA: Diagnosis not present

## 2019-08-09 DIAGNOSIS — S62101A Fracture of unspecified carpal bone, right wrist, initial encounter for closed fracture: Secondary | ICD-10-CM | POA: Diagnosis not present

## 2019-08-09 DIAGNOSIS — M654 Radial styloid tenosynovitis [de Quervain]: Secondary | ICD-10-CM | POA: Diagnosis not present

## 2019-08-14 ENCOUNTER — Ambulatory Visit: Payer: Medicare Other | Admitting: Rehabilitative and Restorative Service Providers"

## 2019-08-14 ENCOUNTER — Other Ambulatory Visit: Payer: Self-pay

## 2019-08-14 ENCOUNTER — Encounter: Payer: Self-pay | Admitting: Rehabilitative and Restorative Service Providers"

## 2019-08-14 DIAGNOSIS — R2681 Unsteadiness on feet: Secondary | ICD-10-CM | POA: Diagnosis not present

## 2019-08-14 NOTE — Patient Instructions (Signed)
Access Code: [email or web address]  URL: [email or web address]  Date: 08/14/2019  Prepared by: Rudell Cobb   Exercises Standing Heel Raise with Chair Support - 20 reps - 1 sets - 3 sec hold - 2x daily - 7x weekly Standing Single Leg Stance with Counter Support - 5 reps - 1 sets                   - max hold - 2x daily - 7x weekly Side Stepping with Counter Support - 10 reps - 1 sets - 2x daily - 7x weekly Soleus Stretch on Wall - 1 reps - 2 sets - 30-60 sec hold - 2x daily - 7x weekly Romberg Stance with Head Rotation - 10 reps - 1 sets - 2x daily - 7x weekly Standing Romberg to 1/2 Tandem Stance - 3 reps - 1 sets - 30 seconds hold - 2x daily - 7x weekly

## 2019-08-14 NOTE — Therapy (Signed)
[facility] [place], [place], [address] Phone: [phone]   Fax:  [phone]  Physical Therapy Treatment  Patient Details  Name: Nancy Wilson MRN: [id number] Date of Birth: 1940-10-13 Referring Provider (PT): Beatrice Lecher   Encounter Date: 08/14/2019  PT End of Session - 08/14/19 1155    Visit Number  5    Number of Visits  12    Date for PT Re-Evaluation  08/13/19    PT Start Time  1152    PT Stop Time  1232    PT Time Calculation (min)  40 min    Equipment Utilized During Treatment  Gait belt    Activity Tolerance  Patient tolerated treatment well    Behavior During Therapy  [facility] for tasks assessed/performed       Past Medical History:  Diagnosis Date  . Allergy   . Depression    long standing  . Hyperlipidemia   . Hypertension     Past Surgical History:  Procedure Laterality Date  . carpel tunnel release, both hands  1980's  . DILATION AND CURETTAGE OF UTERUS    . FRACTURE SURGERY     fell and broke right arm  . GASTROSCOPY  11-06  . trigger finger release, left finger    . TUBAL LIGATION     bilateral    There were no vitals filed for this visit.  Subjective Assessment - 08/14/19 1154    Subjective  The patient reports that she can tell she is leaning to the left side.  She has not had falls.    Pertinent History  HTN, depression, multiple falls and BUE fx ; adds h/o migraines in the past (none recently) gets HA approximately 2 per month    Patient Stated Goals  decrease falls    Currently in Pain?  No/denies         [facility] PT Assessment - 08/14/19 1158      Transfers   Transfers  Sit to Stand    Sit to Stand  6: Modified independent (Device/Increase time)   leans against mat at times   Five time sit to stand comments   21.68 seconds      Berg Balance Test   Sit to Stand  Able to stand without using hands and stabilize independently    Standing Unsupported  Able to  stand safely 2 minutes    Sitting with Back Unsupported but Feet Supported on Floor or Stool  Able to sit safely and securely 2 minutes    Stand to Sit  Sits safely with minimal use of hands    Transfers  Able to transfer safely, minor use of hands    Standing Unsupported with Eyes Closed  Able to stand 10 seconds safely    Standing Unsupported with Feet Together  Able to place feet together independently and stand 1 minute safely    From Standing, Reach Forward with Outstretched Arm  Can reach forward >12 cm safely (5")    From Standing Position, Pick up Object from Floor  Able to pick up shoe safely and easily    From Standing Position, Turn to Look Behind Over each Shoulder  Looks behind from both sides and weight shifts well    Turn 360 Degrees  Able to turn 360 degrees safely but slowly    Standing Unsupported, Alternately Place Feet on Step/Stool  Able to stand independently and safely and complete 8 steps in 20 seconds  Standing Unsupported, One Foot in Front  Able to take small step independently and hold 30 seconds    Standing on One Leg  Tries to lift leg/unable to hold 3 seconds but remains standing independently    Total Score  48    Berg comment:  48/56      Timed Up and Go Test   TUG  --   14.78 seconds                  [facility] Adult PT Treatment/Exercise - 08/14/19 1158      Ambulation/Gait   Ambulation/Gait  Yes    Ambulation/Gait Assistance  6: Modified independent (Device/Increase time)    Ambulation/Gait Assistance Details  Dynamic gait forward/backwards (needs CGA for backwards), gait with arm swing encouraging longer stride.      Ambulation Distance (Feet)  300 Feet    Assistive device  None      Neuro Re-ed    Neuro Re-ed Details   Standing balance performing sidestepping, direction changes with gait, alternating foot taps to 6" surface, lateral step ups R and L with min A to help with balance recovery.  Standing marching in place.  Corner balance with  partial heel toe, and feet together + eyes closed.  Performed anterior/posterior weight shifting with feet in stride position with min A for balance recovery.      Exercises   Exercises  Knee/Hip      Knee/Hip Exercises: Stretches   Gastroc Stretch  2 reps;30 seconds      Knee/Hip Exercises: Standing   Heel Raises  20 reps   UE support needed   Other Standing Knee Exercises  Toe raises unilateral R and L sides with UE support.      Other Standing Knee Exercises  Standing on one leg for LE stability and balance with intermittent UE support.             PT Education - 08/14/19 1252    Education Details  updated and consolidated HEP    Person(s) Educated  Patient    Methods  Explanation;Demonstration;Handout    Comprehension  Verbalized understanding;Returned demonstration          PT Long Term Goals - 08/14/19 1340      PT LONG TERM GOAL #1   Title  Ind with advanced HEP for balance    Time  6    Period  Weeks    Status  Revised    Target Date  09/28/19      PT LONG TERM GOAL #2   Title  Improved BERG score to 50/56 to decrease fall risk.    Baseline  48/56    Time  6    Period  Weeks    Status  Revised      PT LONG TERM GOAL #3   Title  Reduce 5 time sit to stand from 21.68 seconds to < or equal to 16 seconds.    Time  6    Period  Weeks    Target Date  09/28/19      PT LONG TERM GOAL #4   Title  Patient able to safely amb 300 feet indoor/outdoor using LRAD    Time  6    Period  Weeks    Target Date  09/28/19            Plan - 08/14/19 1331    Clinical Impression Statement  The patient met 1 LTG and partially met 2 LTGs.  PT to update goals and renew x 4 more weeks.  Patient has been seen 5 visits out of initially planned 12 visits.  The patient requests 1 visit/week due to copay.  See updated goals and continue to progress emphasizing multi-sensory balance training, righting reactions for midline orientation and dynamic balance.    PT Frequency   1x / week    PT Duration  6 weeks    PT Treatment/Interventions  ADLs/Self Care Home Management;Moist Heat;Gait training;Therapeutic exercise;Therapeutic activities;Balance training;Neuromuscular re-education;Patient/family education;Manual techniques;Vestibular    PT Next Visit Plan  Multi-sensory balance training (foam, eyes closed, head motion, add multi-tasking), dynamic gait, stepping strategies to recover from Loss of balance, check updated HeP    Consulted and Agree with Plan of Care  Patient       Patient will benefit from skilled therapeutic intervention in order to improve the following deficits and impairments:  Abnormal gait, Decreased safety awareness, Decreased balance, Decreased strength, Postural dysfunction, Impaired flexibility  Visit Diagnosis: Unsteadiness on feet     Problem List Patient Active Problem List   Diagnosis Date Noted  . Gait instability 06/05/2019  . Sacroiliac joint pain 12/28/2018  . Lumbar facet joint pain 12/28/2018  . Encounter for long-term use of opiate analgesic 12/28/2018  . Closed compression fracture of L1 lumbar vertebra, initial encounter ([place]) 12/28/2018  . Age-related osteoporosis with current pathological fracture 12/28/2018  . Lumbar foraminal stenosis 12/28/2018  . Wrist fracture, closed, right, initial encounter 12/13/2018  . Depression with anxiety 11/29/2018  . RLS (restless legs syndrome) 11/29/2018  . Frequent falls 09/21/2018  . OAB (overactive bladder) 05/30/2017  . Benign essential tremor 04/18/2017  . Posterior vitreous detachment of left eye 05/20/2016  . Trigger middle finger of right hand 12/01/2015  . Bilateral plantar fasciitis 08/09/2014  . Osteoarthritis of both ankles 08/09/2014  . S/P laparoscopic appendectomy 12/13/2011  . Night sweats 12/13/2011  . Herpes simplex type II infection 09/23/2011  . Knee pain 08/23/2011  . Anxiety 08/23/2011  . BENIGN POSITIONAL VERTIGO 04/24/2010  . Hyperlipidemia 03/28/2009   . FAMILIAL TREMOR 03/28/2009  . HERPES GENITALIS 04/03/2008  . Hypothyroidism 04/03/2008  . Vitamin D deficiency 04/03/2008  . HYPERTENSION, BENIGN 04/03/2008  . GERD 04/03/2008  . HIATAL HERNIA 04/03/2008  . DIVERTICULOSIS OF COLON 04/03/2008  . CKD (chronic kidney disease) stage 3, GFR 30-59 ml/min (HCC) 04/03/2008  . OSTEOARTHRITIS 04/03/2008  . OSTEOPENIA 04/03/2008  . EDEMA 04/03/2008  . Migraine headache without aura 04/03/2008    [place], PT 08/14/2019, 1:41 PM  [facility] [place] [place], [place], [address] Phone: [phone]   Fax:  [phone]  Name: Nancy Wilson MRN: [id number] Date of Birth: 12-14-1940

## 2019-08-15 ENCOUNTER — Ambulatory Visit (INDEPENDENT_AMBULATORY_CARE_PROVIDER_SITE_OTHER): Payer: Medicare Other

## 2019-08-15 ENCOUNTER — Telehealth: Payer: Self-pay | Admitting: Family Medicine

## 2019-08-15 DIAGNOSIS — Z0000 Encounter for general adult medical examination without abnormal findings: Secondary | ICD-10-CM | POA: Diagnosis not present

## 2019-08-15 DIAGNOSIS — Z13820 Encounter for screening for osteoporosis: Secondary | ICD-10-CM | POA: Diagnosis not present

## 2019-08-15 DIAGNOSIS — M810 Age-related osteoporosis without current pathological fracture: Secondary | ICD-10-CM | POA: Diagnosis not present

## 2019-08-15 DIAGNOSIS — Z780 Asymptomatic menopausal state: Secondary | ICD-10-CM | POA: Diagnosis not present

## 2019-08-15 DIAGNOSIS — Z1231 Encounter for screening mammogram for malignant neoplasm of breast: Secondary | ICD-10-CM

## 2019-08-15 NOTE — Telephone Encounter (Signed)
It was pertaining to results and patient was called.  No other questions at this time.

## 2019-08-15 NOTE — Telephone Encounter (Signed)
Patient called, stating that someone from our office had given her a call, and left a voice mail wanting her to call us back but I don't see any documentation regarding this. Please Advise.

## 2019-08-20 ENCOUNTER — Other Ambulatory Visit: Payer: Self-pay | Admitting: Family Medicine

## 2019-08-20 MED ORDER — ALENDRONATE SODIUM 70 MG PO TABS: 70.0000 mg | ORAL_TABLET | ORAL | 4 refills | Status: DC

## 2019-08-21 ENCOUNTER — Other Ambulatory Visit: Payer: Self-pay | Admitting: *Deleted

## 2019-08-21 MED ORDER — CARBIDOPA-LEVODOPA 25-100 MG PO TABS: ORAL_TABLET | ORAL | 1 refills | Status: DC

## 2019-08-27 ENCOUNTER — Encounter: Payer: Medicare Other | Admitting: Rehabilitative and Restorative Service Providers"

## 2019-09-04 ENCOUNTER — Encounter: Payer: Medicare Other | Admitting: Rehabilitative and Restorative Service Providers"

## 2019-09-24 ENCOUNTER — Other Ambulatory Visit: Payer: Self-pay

## 2019-09-24 ENCOUNTER — Encounter: Payer: Self-pay | Admitting: Family Medicine

## 2019-09-24 ENCOUNTER — Ambulatory Visit (INDEPENDENT_AMBULATORY_CARE_PROVIDER_SITE_OTHER): Payer: Medicare Other | Admitting: Family Medicine

## 2019-09-24 VITALS — BP 138/60 | HR 89 | Ht 60.0 in | Wt 145.0 lb

## 2019-09-24 DIAGNOSIS — N3281 Overactive bladder: Secondary | ICD-10-CM | POA: Diagnosis not present

## 2019-09-24 DIAGNOSIS — E785 Hyperlipidemia, unspecified: Secondary | ICD-10-CM

## 2019-09-24 DIAGNOSIS — F341 Dysthymic disorder: Secondary | ICD-10-CM | POA: Diagnosis not present

## 2019-09-24 DIAGNOSIS — G250 Essential tremor: Secondary | ICD-10-CM

## 2019-09-24 DIAGNOSIS — I10 Essential (primary) hypertension: Secondary | ICD-10-CM | POA: Diagnosis not present

## 2019-09-24 MED ORDER — SERTRALINE HCL 25 MG PO TABS: 12.5000 mg | ORAL_TABLET | Freq: Every day | ORAL | 0 refills | Status: DC

## 2019-09-24 MED ORDER — ATORVASTATIN CALCIUM 40 MG PO TABS: 40.0000 mg | ORAL_TABLET | Freq: Every day | ORAL | 3 refills | Status: DC

## 2019-09-24 MED ORDER — BUPROPION HCL ER (XL) 300 MG PO TB24: 300.0000 mg | ORAL_TABLET | Freq: Every day | ORAL | 3 refills | Status: DC

## 2019-09-24 MED ORDER — AMLODIPINE BESYLATE 10 MG PO TABS: 10.0000 mg | ORAL_TABLET | Freq: Every day | ORAL | 1 refills | Status: DC

## 2019-09-24 MED ORDER — PROPRANOLOL HCL 40 MG PO TABS: 40.0000 mg | ORAL_TABLET | Freq: Two times a day (BID) | ORAL | 3 refills | Status: DC

## 2019-09-24 MED ORDER — OXYBUTYNIN CHLORIDE 5 MG PO TABS: 5.0000 mg | ORAL_TABLET | Freq: Two times a day (BID) | ORAL | 1 refills | Status: DC

## 2019-09-24 NOTE — Progress Notes (Signed)
Established Patient Office Visit  Subjective:  Patient ID: Nancy Wilson, female    DOB: Oct 15, 1940  Age: 79 y.o. MRN: [id number]  CC:  Chief Complaint  Patient presents with  . mood    HPI [name] presents for anxiety/depression.  We decided to retry Zoloft which she had been on recently.  We did spread to switch her to Paxil but she became concerned that it was a medication that she actually had a reaction to previously so she stopped it.  We decided to restart the Zoloft and then just work on adjusting her dose until it feels therapeutic.  This is her 45-month follow-up.  After restarting the medication she noticed that she was falling more frequently and so decided to stop taking it.  She said she actually fell about 4 times within 2 days.  She says she was not feeling dizzy or lightheaded.  She denies any weakness at the time.  In fact she is just not really sure how she fell.  She says her daughter had to help her up twice.  So at that point after mentioning it to her husband who recommended that she maybe stop the medication.  Hypertension- Pt denies chest pain, SOB, dizziness, or heart palpitations.  Taking meds as directed w/o problems.  Denies medication side effects.      Past Medical History:  Diagnosis Date  . Allergy   . Depression    long standing  . Hyperlipidemia   . Hypertension     Past Surgical History:  Procedure Laterality Date  . carpel tunnel release, both hands  1980's  . DILATION AND CURETTAGE OF UTERUS    . FRACTURE SURGERY     fell and broke right arm  . GASTROSCOPY  11-06  . trigger finger release, left finger    . TUBAL LIGATION     bilateral    Family History  Problem Relation Age of Onset  . Diabetes Mother   . Hyperlipidemia Mother   . Heart disease Mother 41  . Hypertension Mother   . Diabetes Father   . Hyperlipidemia Father   . Tremor Father        hand- started in his 61's  . Hypertension Father   . Tremor Cousin      Social History   Socioeconomic History  . Marital status: Married    Spouse name: Geographical information systems officer  . Number of children: 2  . Years of education: 9th  . Highest education level: 9th grade  Occupational History  . Occupation: Housewife    Comment: retired  Tobacco Use  . Smoking status: Never Smoker  . Smokeless tobacco: Never Used  Substance and Sexual Activity  . Alcohol use: No    Alcohol/week: 0.0 standard drinks  . Drug use: No  . Sexual activity: Not Currently  Other Topics Concern  . Not on file  Social History Narrative   No caffeine use. Doing physical therapy to help with her balance issue   Social Determinants of Health   Financial Resource Strain:   . Difficulty of Paying Living Expenses: Not on file  Food Insecurity:   . Worried About Programme researcher, broadcasting/film/video in the Last Year: Not on file  . Ran Out of Food in the Last Year: Not on file  Transportation Needs:   . Lack of Transportation (Medical): Not on file  . Lack of Transportation (Non-Medical): Not on file  Physical Activity:   . Days of Exercise per  Week: Not on file  . Minutes of Exercise per Session: Not on file  Stress:   . Feeling of Stress : Not on file  Social Connections: Unknown  . Frequency of Communication with Friends and Family: Once a week  . Frequency of Social Gatherings with Friends and Family: Not on file  . Attends Religious Services: Not on file  . Active Member of Clubs or Organizations: Not on file  . Attends Archivist Meetings: Not on file  . Marital Status: Not on file  Intimate Partner Violence:   . Fear of Current or Ex-Partner: Not on file  . Emotionally Abused: Not on file  . Physically Abused: Not on file  . Sexually Abused: Not on file    Outpatient Medications Prior to Visit  Medication Sig Dispense Refill  . acyclovir (ZOVIRAX) 400 MG tablet Take 1 tablet (400 mg total) by mouth at bedtime. 90 tablet 1  . alendronate (FOSAMAX) 70 MG tablet Take 1 tablet (70 mg  total) by mouth every 7 (seven) days. Take with a full glass of water on an empty stomach. 12 tablet 4  . carbidopa-levodopa (SINEMET IR) 25-100 MG tablet TAKE 1 TABLET BY MOUTH EVERYDAY AT BEDTIME 90 tablet 1  . Cholecalciferol (VITAMIN D3) 2000 units capsule Take 2,000 Units by mouth daily.    Marland Kitchen eletriptan (RELPAX) 20 MG tablet Take 1 tablet (20 mg total) by mouth as needed for migraine or headache. May repeat in 2 hours if headache persists or recurs. 30 tablet 4  . promethazine (PHENERGAN) 25 MG tablet Take 1 tablet (25 mg total) by mouth every 8 (eight) hours as needed for nausea or vomiting. 20 tablet 3  . amLODipine (NORVASC) 10 MG tablet Take 1 tablet (10 mg total) by mouth daily. 90 tablet 1  . atorvastatin (LIPITOR) 40 MG tablet Take 1 tablet (40 mg total) by mouth daily. 90 tablet 3  . buPROPion (WELLBUTRIN XL) 300 MG 24 hr tablet Take 1 tablet (300 mg total) by mouth daily. 90 tablet 3  . oxybutynin (DITROPAN) 5 MG tablet Take 1 tablet (5 mg total) by mouth 2 (two) times daily. 180 tablet 1  . propranolol (INDERAL) 40 MG tablet Take 1 tablet (40 mg total) by mouth 2 (two) times daily. 180 tablet 3  . sertraline (ZOLOFT) 50 MG tablet 1/2 tab PO QD x 6 days then increase to whole tab QD x 6 days, then increase to 2 tabs QD x 10 days, then increase to 3 tabs daily. (Patient not taking: Reported on 09/24/2019) 80 tablet 0   No facility-administered medications prior to visit.    Allergies  Allergen Reactions  . Hydrocodone-Acetaminophen Hives  . Codeine Other (See Comments)  . Cymbalta [Duloxetine Hcl] Other (See Comments)  . Doxycycline   . Effexor [Venlafaxine] Other (See Comments)    Tearful  . Guaifenesin   . Meperidine Other (See Comments)  . Meperidine Hcl   . Metronidazole   . Morphine Other (See Comments)  . Penicillins   . Prednisone Other (See Comments)  . Propoxyphene Nausea And Vomiting  . Prozac [Fluoxetine Hcl] Other (See Comments)  . Ropinirole Other (See  Comments)    Sweaty and tachycardic    ROS Review of Systems    Objective:    Physical Exam  Constitutional: She is oriented to person, place, and time. She appears well-developed and well-nourished.  HENT:  Head: Normocephalic and atraumatic.  Cardiovascular: Normal rate, regular rhythm and normal heart sounds.  Pulmonary/Chest: Effort normal and breath sounds normal.  Neurological: She is alert and oriented to person, place, and time.  Skin: Skin is warm and dry.  Psychiatric: She has a normal mood and affect. Her behavior is normal.    BP 138/60   Pulse 89   Ht 5' (1.524 m)   Wt 145 lb (65.8 kg)   SpO2 99%   BMI 28.32 kg/m  Wt Readings from Last 3 Encounters:  09/24/19 145 lb (65.8 kg)  07/23/19 145 lb (65.8 kg)  07/18/19 141 lb (64 kg)     There are no preventive care reminders to display for this patient.  There are no preventive care reminders to display for this patient.  Lab Results  Component Value Date   TSH 2.88 11/29/2018   Lab Results  Component Value Date   WBC 6.2 11/29/2018   HGB 13.5 11/29/2018   HCT 40.0 11/29/2018   MCV 95.2 11/29/2018   PLT 244 11/29/2018   Lab Results  Component Value Date   NA 139 11/29/2018   K 4.5 11/29/2018   CO2 27 11/29/2018   GLUCOSE 91 11/29/2018   BUN 17 11/29/2018   CREATININE 1.22 (H) 11/29/2018   BILITOT 0.6 11/29/2018   ALKPHOS 92 12/02/2016   AST 16 11/29/2018   ALT 14 11/29/2018   PROT 6.5 11/29/2018   ALBUMIN 4.3 12/02/2016   CALCIUM 9.4 11/29/2018   Lab Results  Component Value Date   CHOL 231 (H) 11/29/2018   Lab Results  Component Value Date   HDL 61 11/29/2018   Lab Results  Component Value Date   LDLCALC 147 (H) 11/29/2018   Lab Results  Component Value Date   TRIG 115 11/29/2018   Lab Results  Component Value Date   CHOLHDL 3.8 11/29/2018   No results found for: HGBA1C    Assessment & Plan:   Problem List Items Addressed This Visit      Cardiovascular and  Mediastinum   HYPERTENSION, BENIGN    Well controlled. Continue current regimen. Follow up in  6 mo. Med refilled.       Relevant Medications   amLODipine (NORVASC) 10 MG tablet   atorvastatin (LIPITOR) 40 MG tablet   propranolol (INDERAL) 40 MG tablet     Nervous and Auditory   Benign essential tremor    Due to refill medications.         Genitourinary   OAB (overactive bladder)   Relevant Medications   oxybutynin (DITROPAN) 5 MG tablet     Other   Hyperlipidemia   Relevant Medications   amLODipine (NORVASC) 10 MG tablet   atorvastatin (LIPITOR) 40 MG tablet   propranolol (INDERAL) 40 MG tablet    Other Visit Diagnoses    ANXIETY DEPRESSION    -  Primary   Relevant Medications   sertraline (ZOLOFT) 25 MG tablet   buPROPion (WELLBUTRIN XL) 300 MG 24 hr tablet      Anxiety/depression-discussed options.  We could try something different though she is already tried multiple medications and not done well with them.  Zoloft is the one that she has done well on the only reason we changed it recently is because she felt like it was losing its effectiveness.  I be starting her back on the 50 mg was a little too aggressive.  Though she was supposed to take a half of a tab daily for 6 days and then take a whole tab.  We will start her even lower  and then work her way back up.  Eventually would like to try to get her up to 200 mg as long as she is tolerating it well.  Did discuss with her that if she starts falling again to please stop it immediately and let us know.  Meds ordered this encounter  Medications  . sertraline (ZOLOFT) 25 MG tablet    Sig: Take 0.5 tablets (12.5 mg total) by mouth daily. X 6 days, then  increase to whole tab QD x 6 days, then increase to 2 tabs QD x 10 days.    Dispense:  60 tablet    Refill:  0  . amLODipine (NORVASC) 10 MG tablet    Sig: Take 1 tablet (10 mg total) by mouth daily.    Dispense:  90 tablet    Refill:  1  . atorvastatin (LIPITOR) 40 MG  tablet    Sig: Take 1 tablet (40 mg total) by mouth daily.    Dispense:  90 tablet    Refill:  3  . buPROPion (WELLBUTRIN XL) 300 MG 24 hr tablet    Sig: Take 1 tablet (300 mg total) by mouth daily.    Dispense:  90 tablet    Refill:  3  . propranolol (INDERAL) 40 MG tablet    Sig: Take 1 tablet (40 mg total) by mouth 2 (two) times daily.    Dispense:  180 tablet    Refill:  3  . oxybutynin (DITROPAN) 5 MG tablet    Sig: Take 1 tablet (5 mg total) by mouth 2 (two) times daily.    Dispense:  180 tablet    Refill:  1    Follow-up: Return in about 2 months (around 11/22/2019), or if symptoms worsen or fail to improve, for Mood medication and BP.  Nani Gasser, MD

## 2019-09-24 NOTE — Patient Instructions (Signed)
Restart the sertraline very slowly.   You can take a half of a tab of the 25 mg pill for 6 days. Then increase to a whole tab for 6 to 10 days. Then increase to 2 tabs daily or switch to the 50 mg tabs if you still have them at home.  If you start following again then please let me know.

## 2019-09-24 NOTE — Assessment & Plan Note (Signed)
Due to refill medications.

## 2019-09-24 NOTE — Assessment & Plan Note (Signed)
Well controlled. Continue current regimen. Follow up in  6 mo .  Med refilled.   

## 2019-10-01 ENCOUNTER — Encounter: Payer: Self-pay | Admitting: Rehabilitative and Restorative Service Providers"

## 2019-10-01 NOTE — Therapy (Signed)
[facility] [address] [place], [place], [address] Phone: [phone]   Fax:  [phone]  Patient Details  Name: Nancy Wilson MRN: [id number] Date of Birth: 01/06/1941 Referring Provider:  Metheney, Catherine, MD  Encounter Date: last encounter 08/14/2019  PHYSICAL THERAPY DISCHARGE SUMMARY  Visits from Start of Care: 5  Current functional level related to goals / functional outcomes: PT Long Term Goals - 08/14/19 1340      PT LONG TERM GOAL #1   Title  Ind with advanced HEP for balance    Time  6    Period  Weeks    Status  Revised    Target Date  09/28/19      PT LONG TERM GOAL #2   Title  Improved BERG score to 50/56 to decrease fall risk.    Baseline  48/56    Time  6    Period  Weeks    Status  Revised      PT LONG TERM GOAL #3   Title  Reduce 5 time sit to stand from 21.68 seconds to < or equal to 16 seconds.    Time  6    Period  Weeks    Target Date  09/28/19      PT LONG TERM GOAL #4   Title  Patient able to safely amb 300 feet indoor/outdoor using LRAD    Time  6    Period  Weeks    Target Date  09/28/19      Patient did not return for further visits.   Remaining deficits: See last treatment note for patient status.   Education / Equipment: Home program, home safety/fall prevention.  Plan: Patient agrees to discharge.  Patient goals were not met. Patient is being discharged due to not returning since the last visit.  ?????     Thank you for the referral of this patient.  , MPT   , 10/01/2019, 9:05 PM  [facility] [address] [place], [place], [address] Phone: [phone]   Fax:  [phone] 

## 2019-10-10 ENCOUNTER — Other Ambulatory Visit: Payer: Self-pay | Admitting: Family Medicine

## 2019-11-02 ENCOUNTER — Other Ambulatory Visit: Payer: Self-pay | Admitting: Family Medicine

## 2019-11-02 DIAGNOSIS — M65341 Trigger finger, right ring finger: Secondary | ICD-10-CM | POA: Diagnosis not present

## 2019-11-02 DIAGNOSIS — M65311 Trigger thumb, right thumb: Secondary | ICD-10-CM | POA: Diagnosis not present

## 2019-11-22 ENCOUNTER — Encounter: Payer: Self-pay | Admitting: Family Medicine

## 2019-11-22 ENCOUNTER — Other Ambulatory Visit: Payer: Self-pay

## 2019-11-22 ENCOUNTER — Ambulatory Visit (INDEPENDENT_AMBULATORY_CARE_PROVIDER_SITE_OTHER): Payer: Medicare Other | Admitting: Family Medicine

## 2019-11-22 VITALS — BP 132/67 | HR 77 | Ht 60.0 in | Wt 154.0 lb

## 2019-11-22 DIAGNOSIS — I10 Essential (primary) hypertension: Secondary | ICD-10-CM | POA: Diagnosis not present

## 2019-11-22 DIAGNOSIS — R296 Repeated falls: Secondary | ICD-10-CM

## 2019-11-22 DIAGNOSIS — F341 Dysthymic disorder: Secondary | ICD-10-CM

## 2019-11-22 DIAGNOSIS — F418 Other specified anxiety disorders: Secondary | ICD-10-CM

## 2019-11-22 DIAGNOSIS — K068 Other specified disorders of gingiva and edentulous alveolar ridge: Secondary | ICD-10-CM

## 2019-11-22 MED ORDER — SERTRALINE HCL 100 MG PO TABS: 100.0000 mg | ORAL_TABLET | Freq: Every day | ORAL | 0 refills | Status: DC

## 2019-11-22 MED ORDER — TRIAMCINOLONE ACETONIDE 0.5 % EX OINT: 1.0000 "application " | TOPICAL_OINTMENT | Freq: Two times a day (BID) | CUTANEOUS | 0 refills | Status: DC

## 2019-11-22 MED ORDER — SERTRALINE HCL 100 MG PO TABS: 100.0000 mg | ORAL_TABLET | Freq: Every day | ORAL | 1 refills | Status: DC

## 2019-11-22 NOTE — Progress Notes (Signed)
Established Patient Office Visit  Subjective:  Patient ID: Nancy Wilson, female    DOB: 11-Mar-1941  Age: 79 y.o. MRN: [id number]  CC:  Chief Complaint  Patient presents with  . mood    PREV PHQ=7/SWD; GAD=4/VD    HPI Nancy Wilson presents for 2 months for pression/anxiety.  She had previously tried multiple medications in the past.  She had done well with Zoloft in the past but at some point we changed to because she felt like it was losing its effectiveness so we decided to start back on the 50 mg   she was previously on 100 mg and we have been working to get her back up to that dose.  She has been tolerating the medication well so far and would like to go ahead and go up to the 100 mg on her refill she would like a 2-week sent to her local and then mail order prescription filled.  She still reports feeling like she worries a little bit too much and has trouble relaxing.  Her sleep is fair she often has difficulty falling asleep but says when she is asleep she does well.  She has been a little bit more stressed because her brother is currently hospitalized with a blood clot.  He has been there for 7 days and she does not know when he is going to be able to come home.  Hypertension-blood pressure is up a little bit today.  She also noticed a lesion on her right upper outer gum area.  She felt it with her time and looked in the mirror and noticed a spot she was not sure if it was an ulcer or not.  Is not tender or painful.  She thinks it has been there for about 3 weeks at this point she has not seen her dentist about it yet.  Migraine headaches-overall doing really well.  She said she gets maybe 1 headache every 2 to 3 months.  Most of the time she can go about 3 months in between and says the Relpax does work well when she uses it.  Past Medical History:  Diagnosis Date  . Allergy   . Depression    long standing  . Hyperlipidemia   . Hypertension     Past Surgical History:   Procedure Laterality Date  . carpel tunnel release, both hands  1980's  . DILATION AND CURETTAGE OF UTERUS    . FRACTURE SURGERY     fell and broke right arm  . GASTROSCOPY  11-06  . trigger finger release, left finger    . TUBAL LIGATION     bilateral    Family History  Problem Relation Age of Onset  . Diabetes Mother   . Hyperlipidemia Mother   . Heart disease Mother 23  . Hypertension Mother   . Diabetes Father   . Hyperlipidemia Father   . Tremor Father        hand- started in his 12's  . Hypertension Father   . Tremor Cousin     Social History   Socioeconomic History  . Marital status: Married    Spouse name: Dance movement psychotherapist  . Number of children: 2  . Years of education: 9th  . Highest education level: 9th grade  Occupational History  . Occupation: Housewife    Comment: retired  Tobacco Use  . Smoking status: Never Smoker  . Smokeless tobacco: Never Used  Substance and Sexual Activity  . Alcohol use: No  Alcohol/week: 0.0 standard drinks  . Drug use: No  . Sexual activity: Not Currently  Other Topics Concern  . Not on file  Social History Narrative   No caffeine use. Doing physical therapy to help with her balance issue   Social Determinants of Health   Financial Resource Strain:   . Difficulty of Paying Living Expenses: Not on file  Food Insecurity:   . Worried About Programme researcher, broadcasting/film/video in the Last Year: Not on file  . Ran Out of Food in the Last Year: Not on file  Transportation Needs:   . Lack of Transportation (Medical): Not on file  . Lack of Transportation (Non-Medical): Not on file  Physical Activity:   . Days of Exercise per Week: Not on file  . Minutes of Exercise per Session: Not on file  Stress:   . Feeling of Stress : Not on file  Social Connections: Unknown  . Frequency of Communication with Friends and Family: Once a week  . Frequency of Social Gatherings with Friends and Family: Not on file  . Attends Religious Services: Not on file   . Active Member of Clubs or Organizations: Not on file  . Attends Banker Meetings: Not on file  . Marital Status: Not on file  Intimate Partner Violence:   . Fear of Current or Ex-Partner: Not on file  . Emotionally Abused: Not on file  . Physically Abused: Not on file  . Sexually Abused: Not on file    Outpatient Medications Prior to Visit  Medication Sig Dispense Refill  . acyclovir (ZOVIRAX) 400 MG tablet TAKE 1 TABLET BY MOUTH AT  BEDTIME 90 tablet 1  . alendronate (FOSAMAX) 70 MG tablet Take 1 tablet (70 mg total) by mouth every 7 (seven) days. Take with a full glass of water on an empty stomach. 12 tablet 4  . amLODipine (NORVASC) 10 MG tablet Take 1 tablet (10 mg total) by mouth daily. 90 tablet 1  . atorvastatin (LIPITOR) 40 MG tablet Take 1 tablet (40 mg total) by mouth daily. 90 tablet 3  . buPROPion (WELLBUTRIN XL) 300 MG 24 hr tablet Take 1 tablet (300 mg total) by mouth daily. 90 tablet 3  . carbidopa-levodopa (SINEMET IR) 25-100 MG tablet TAKE 1 TABLET BY MOUTH EVERYDAY AT BEDTIME 90 tablet 1  . Cholecalciferol (VITAMIN D3) 2000 units capsule Take 2,000 Units by mouth daily.    Marland Kitchen eletriptan (RELPAX) 20 MG tablet Take 1 tablet (20 mg total) by mouth as needed for migraine or headache. May repeat in 2 hours if headache persists or recurs. 30 tablet 4  . oxybutynin (DITROPAN) 5 MG tablet Take 1 tablet (5 mg total) by mouth 2 (two) times daily. 180 tablet 1  . promethazine (PHENERGAN) 25 MG tablet Take 1 tablet (25 mg total) by mouth every 8 (eight) hours as needed for nausea or vomiting. 20 tablet 3  . propranolol (INDERAL) 40 MG tablet Take 1 tablet (40 mg total) by mouth 2 (two) times daily. 180 tablet 3  . sertraline (ZOLOFT) 25 MG tablet Take 0.5 tablets (12.5 mg total) by mouth daily. X 6 days, then  increase to whole tab QD x 6 days, then increase to 2 tabs QD x 10 days. 60 tablet 0   No facility-administered medications prior to visit.    Allergies   Allergen Reactions  . Hydrocodone-Acetaminophen Hives  . Codeine Other (See Comments)  . Cymbalta [Duloxetine Hcl] Other (See Comments)  . Doxycycline   .  Effexor [Venlafaxine] Other (See Comments)    Tearful  . Guaifenesin   . Meperidine Other (See Comments)  . Meperidine Hcl   . Metronidazole   . Morphine Other (See Comments)  . Penicillins   . Prednisone Other (See Comments)  . Propoxyphene Nausea And Vomiting  . Prozac [Fluoxetine Hcl] Other (See Comments)  . Ropinirole Other (See Comments)    Sweaty and tachycardic    ROS Review of Systems    Objective:    Physical Exam  Constitutional: She is oriented to person, place, and time. She appears well-developed and well-nourished.  HENT:  Head: Normocephalic and atraumatic.  Erythematous papule on the right upper outer gum line. Nontender.  No vesicle or ulceration.   Eyes: Conjunctivae are normal.  Cardiovascular: Normal rate, regular rhythm and normal heart sounds.  Pulmonary/Chest: Effort normal and breath sounds normal.  Neurological: She is alert and oriented to person, place, and time.  Skin: Skin is warm and dry.  Psychiatric: She has a normal mood and affect. Her behavior is normal.    BP 132/67   Pulse 77   Ht 5' (1.524 m)   Wt 154 lb (69.9 kg)   SpO2 97%   BMI 30.08 kg/m  Wt Readings from Last 3 Encounters:  11/22/19 154 lb (69.9 kg)  09/24/19 145 lb (65.8 kg)  07/23/19 145 lb (65.8 kg)     There are no preventive care reminders to display for this patient.  There are no preventive care reminders to display for this patient.  Lab Results  Component Value Date   TSH 2.88 11/29/2018   Lab Results  Component Value Date   WBC 6.2 11/29/2018   HGB 13.5 11/29/2018   HCT 40.0 11/29/2018   MCV 95.2 11/29/2018   PLT 244 11/29/2018   Lab Results  Component Value Date   NA 139 11/29/2018   K 4.5 11/29/2018   CO2 27 11/29/2018   GLUCOSE 91 11/29/2018   BUN 17 11/29/2018   CREATININE 1.22 (H)  11/29/2018   BILITOT 0.6 11/29/2018   ALKPHOS 92 12/02/2016   AST 16 11/29/2018   ALT 14 11/29/2018   PROT 6.5 11/29/2018   ALBUMIN 4.3 12/02/2016   CALCIUM 9.4 11/29/2018   Lab Results  Component Value Date   CHOL 231 (H) 11/29/2018   Lab Results  Component Value Date   HDL 61 11/29/2018   Lab Results  Component Value Date   LDLCALC 147 (H) 11/29/2018   Lab Results  Component Value Date   TRIG 115 11/29/2018   Lab Results  Component Value Date   CHOLHDL 3.8 11/29/2018   No results found for: HGBA1C    Assessment & Plan:   Problem List Items Addressed This Visit      Cardiovascular and Mediastinum   HYPERTENSION, BENIGN    Well controlled. Continue current regimen. Follow up in  6 mo        Other   Frequent falls    Documented fall risk.      Depression with anxiety    Doing well overall on sertraline so far.  Go ahead and increase to 100 mg.  2-week prescription sent to local pharmacy and 90-day sent to mail order.  Follow-up in 3 months.  HQ 9 score of 0 today and GAD-7 score of 6. Continue Wellbutrin as well.       Relevant Medications   sertraline (ZOLOFT) 100 MG tablet    Other Visit Diagnoses    Gum  lesion    -  Primary   ANXIETY DEPRESSION       Relevant Medications   sertraline (ZOLOFT) 100 MG tablet     Gum lesion - Recommend dilute peroxide and swish twice a day.  I am also get a send over cream to your pharmacy to apply this to the spot on your gum.  If it is not improving/resolving after 1 week then please call your dentist to take a look at the area. rx sent for triamcinolone ointment.    Meds ordered this encounter  Medications  . DISCONTD: sertraline (ZOLOFT) 100 MG tablet    Sig: Take 1 tablet (100 mg total) by mouth daily. X 6 days, then  increase to whole tab QD x 6 days, then increase to 2 tabs QD x 10 days.    Dispense:  15 tablet    Refill:  0  . triamcinolone ointment (KENALOG) 0.5 %    Sig: Apply 1 application topically 2  (two) times daily. To lesion in on the gum.    Dispense:  15 g    Refill:  0  . DISCONTD: sertraline (ZOLOFT) 100 MG tablet    Sig: Take 1 tablet (100 mg total) by mouth daily.    Dispense:  15 tablet    Refill:  0    These disregard previous prescription.  . sertraline (ZOLOFT) 100 MG tablet    Sig: Take 1 tablet (100 mg total) by mouth daily.    Dispense:  90 tablet    Refill:  1    These disregard previous prescription.    Follow-up: Return in about 3 months (around 02/22/2020) for Mood and BP.    Nani Gasser, MD

## 2019-11-22 NOTE — Patient Instructions (Signed)
Recommend dilute peroxide and swish twice a day.  I am also get a send over cream to your pharmacy to apply this to the spot on your gum.  If it is not improving/resolving after 1 week then please call your dentist to take a look at the area.

## 2019-11-22 NOTE — Progress Notes (Signed)
Doing well on current regimen. She is taking 50 mg of the Sertraline.

## 2019-11-22 NOTE — Assessment & Plan Note (Addendum)
Doing well overall on sertraline so far.  Go ahead and increase to 100 mg.  2-week prescription sent to local pharmacy and 90-day sent to mail order.  Follow-up in 3 months.  HQ 9 score of 0 today and GAD-7 score of 6. Continue Wellbutrin as well.

## 2019-11-22 NOTE — Assessment & Plan Note (Signed)
Documented fall risk.

## 2019-11-22 NOTE — Assessment & Plan Note (Signed)
Well controlled. Continue current regimen. Follow up in  6 mo  

## 2019-11-30 ENCOUNTER — Telehealth: Payer: Self-pay

## 2019-11-30 DIAGNOSIS — N1831 Chronic kidney disease, stage 3a: Secondary | ICD-10-CM

## 2019-11-30 DIAGNOSIS — E785 Hyperlipidemia, unspecified: Secondary | ICD-10-CM | POA: Diagnosis not present

## 2019-11-30 DIAGNOSIS — I10 Essential (primary) hypertension: Secondary | ICD-10-CM

## 2019-11-30 DIAGNOSIS — Z0000 Encounter for general adult medical examination without abnormal findings: Secondary | ICD-10-CM | POA: Diagnosis not present

## 2019-11-30 NOTE — Telephone Encounter (Signed)
cmp lipid and cbc ordered.

## 2019-12-01 LAB — CBC WITH DIFFERENTIAL/PLATELET
Absolute Monocytes: 672 cells/uL (ref 200–950)
Basophils Absolute: 12 cells/uL (ref 0–200)
Basophils Relative: 0.2 %
Eosinophils Absolute: 132 cells/uL (ref 15–500)
Eosinophils Relative: 2.2 %
HCT: 42.8 % (ref 35.0–45.0)
Hemoglobin: 14.1 g/dL (ref 11.7–15.5)
Lymphs Abs: 1866 cells/uL (ref 850–3900)
MCH: 32.1 pg (ref 27.0–33.0)
MCHC: 32.9 g/dL (ref 32.0–36.0)
MCV: 97.5 fL (ref 80.0–100.0)
MPV: 10 fL (ref 7.5–12.5)
Monocytes Relative: 11.2 %
Neutro Abs: 3318 cells/uL (ref 1500–7800)
Neutrophils Relative %: 55.3 %
Platelets: 252 10*3/uL (ref 140–400)
RBC: 4.39 10*6/uL (ref 3.80–5.10)
RDW: 11.8 % (ref 11.0–15.0)
Total Lymphocyte: 31.1 %
WBC: 6 10*3/uL (ref 3.8–10.8)

## 2019-12-01 LAB — COMPLETE METABOLIC PANEL WITH GFR
AG Ratio: 1.9 (calc) (ref 1.0–2.5)
ALT: 17 U/L (ref 6–29)
AST: 22 U/L (ref 10–35)
Albumin: 4.3 g/dL (ref 3.6–5.1)
Alkaline phosphatase (APISO): 109 U/L (ref 37–153)
BUN/Creatinine Ratio: 15 (calc) (ref 6–22)
BUN: 17 mg/dL (ref 7–25)
CO2: 23 mmol/L (ref 20–32)
Calcium: 9.8 mg/dL (ref 8.6–10.4)
Chloride: 104 mmol/L (ref 98–110)
Creat: 1.17 mg/dL — ABNORMAL HIGH (ref 0.60–0.93)
GFR, Est African American: 52 mL/min/{1.73_m2} — ABNORMAL LOW (ref >=60)
GFR, Est Non African American: 45 mL/min/{1.73_m2} — ABNORMAL LOW (ref >=60)
Globulin: 2.3 g/dL (calc) (ref 1.9–3.7)
Glucose, Bld: 101 mg/dL — ABNORMAL HIGH (ref 65–99)
Potassium: 5.2 mmol/L (ref 3.5–5.3)
Sodium: 139 mmol/L (ref 135–146)
Total Bilirubin: 0.5 mg/dL (ref 0.2–1.2)
Total Protein: 6.6 g/dL (ref 6.1–8.1)

## 2019-12-01 LAB — LIPID PANEL W/REFLEX DIRECT LDL
Cholesterol: 229 mg/dL — ABNORMAL HIGH (ref <200)
HDL: 61 mg/dL (ref >=50)
LDL Cholesterol (Calc): 148 mg/dL (calc) — ABNORMAL HIGH
Non-HDL Cholesterol (Calc): 168 mg/dL (calc) — ABNORMAL HIGH (ref <130)
Total CHOL/HDL Ratio: 3.8 (calc) (ref <5.0)
Triglycerides: 94 mg/dL (ref <150)

## 2020-01-03 ENCOUNTER — Other Ambulatory Visit: Payer: Self-pay | Admitting: Family Medicine

## 2020-01-17 ENCOUNTER — Other Ambulatory Visit: Payer: Self-pay

## 2020-01-17 ENCOUNTER — Emergency Department
Admission: EM | Admit: 2020-01-17 | Discharge: 2020-01-17 | Disposition: A | Discharge status: Home / Self Care | Payer: Medicare Other | Source: Home / Self Care | Attending: Family Medicine | Admitting: Family Medicine

## 2020-01-17 DIAGNOSIS — S0181XA Laceration without foreign body of other part of head, initial encounter: Secondary | ICD-10-CM

## 2020-01-17 MED ORDER — CEPHALEXIN 250 MG PO CAPS: ORAL_CAPSULE | ORAL | 0 refills | Status: DC

## 2020-01-17 MED ORDER — LIDOCAINE-EPINEPHRINE-TETRACAINE (LET) TOPICAL GEL
3.0000 mL | Freq: Once | TOPICAL | Status: AC
  Administered 2020-01-17: 3 mL via TOPICAL

## 2020-01-17 NOTE — ED Provider Notes (Signed)
[facility]    CSN: [id number] Arrival date & time: 01/17/20  1622      History   Chief Complaint Chief Complaint  Patient presents with  . Lip Laceration  . Fall    HPI Nancy Wilson is a 79 y.o. female.   Patient tripped while walking down stairs about an hour ago, lacerating her chin on the floor.  She denies loss of consciousness and other injuries.  She denies injury to her tongue, teeth, and gingiva.  Her last Tdap was five years ago.  The history is provided by the patient.  Laceration Location: chin. Length:  3.5cm Depth:  Through underlying tissue Quality: straight   Bleeding: controlled   Time since incident:  1 hour Laceration mechanism:  Blunt object Pain details:    Quality:  Aching   Severity:  Mild   Timing:  Constant   Progression:  Unchanged Foreign body present:  No foreign bodies Relieved by:  Nothing Worsened by:  Nothing Ineffective treatments:  None tried Tetanus status:  Up to date Associated symptoms: no swelling     Past Medical History:  Diagnosis Date  . Allergy   . Depression    long standing  . Hyperlipidemia   . Hypertension     Patient Active Problem List   Diagnosis Date Noted  . Gait instability 06/05/2019  . Sacroiliac joint pain 12/28/2018  . Lumbar facet joint pain 12/28/2018  . Encounter for long-term use of opiate analgesic 12/28/2018  . Closed compression fracture of L1 lumbar vertebra, initial encounter ([place]) 12/28/2018  . Age-related osteoporosis with current pathological fracture 12/28/2018  . Lumbar foraminal stenosis 12/28/2018  . Wrist fracture, closed, right, initial encounter 12/13/2018  . Depression with anxiety 11/29/2018  . RLS (restless legs syndrome) 11/29/2018  . Frequent falls 09/21/2018  . OAB (overactive bladder) 05/30/2017  . Benign essential tremor 04/18/2017  . Posterior vitreous detachment of left eye 05/20/2016  . Trigger middle finger of right hand 12/01/2015  . Bilateral  plantar fasciitis 08/09/2014  . Osteoarthritis of both ankles 08/09/2014  . S/P laparoscopic appendectomy 12/13/2011  . Night sweats 12/13/2011  . Herpes simplex type II infection 09/23/2011  . Knee pain 08/23/2011  . BENIGN POSITIONAL VERTIGO 04/24/2010  . Hyperlipidemia 03/28/2009  . FAMILIAL TREMOR 03/28/2009  . HERPES GENITALIS 04/03/2008  . Hypothyroidism 04/03/2008  . Vitamin D deficiency 04/03/2008  . HYPERTENSION, BENIGN 04/03/2008  . GERD 04/03/2008  . HIATAL HERNIA 04/03/2008  . DIVERTICULOSIS OF COLON 04/03/2008  . CKD (chronic kidney disease) stage 3, GFR 30-59 ml/min (HCC) 04/03/2008  . OSTEOARTHRITIS 04/03/2008  . OSTEOPENIA 04/03/2008  . EDEMA 04/03/2008  . Migraine headache without aura 04/03/2008    Past Surgical History:  Procedure Laterality Date  . carpel tunnel release, both hands  1980's  . DILATION AND CURETTAGE OF UTERUS    . FRACTURE SURGERY     fell and broke right arm  . GASTROSCOPY  11-06  . trigger finger release, left finger    . TUBAL LIGATION     bilateral    OB History   No obstetric history on file.      Home Medications    Prior to Admission medications   Medication Sig Start Date End Date Taking? Authorizing Provider  acyclovir (ZOVIRAX) 400 MG tablet TAKE 1 TABLET BY MOUTH AT  BEDTIME 11/05/19   Hali Marry, MD  alendronate (FOSAMAX) 70 MG tablet Take 1 tablet (70 mg total) by mouth every 7 (  seven) days. Take with a full glass of water on an empty stomach. 08/20/19   Agapito Games, MD  amLODipine (NORVASC) 10 MG tablet Take 1 tablet (10 mg total) by mouth daily. 09/24/19   Agapito Games, MD  atorvastatin (LIPITOR) 40 MG tablet Take 1 tablet (40 mg total) by mouth daily. 09/24/19   Agapito Games, MD  buPROPion (WELLBUTRIN XL) 300 MG 24 hr tablet Take 1 tablet (300 mg total) by mouth daily. 09/24/19   Agapito Games, MD  carbidopa-levodopa (SINEMET IR) 25-100 MG tablet TAKE 1 TABLET BY MOUTH  DAILY  AT BEDTIME 01/03/20   Agapito Games, MD  Cholecalciferol (VITAMIN D3) 2000 units capsule Take 2,000 Units by mouth daily.    [provider]  eletriptan (RELPAX) 20 MG tablet Take 1 tablet (20 mg total) by mouth as needed for migraine or headache. May repeat in 2 hours if headache persists or recurs. 04/09/19   Agapito Games, MD  oxybutynin (DITROPAN) 5 MG tablet Take 1 tablet (5 mg total) by mouth 2 (two) times daily. 09/24/19   Agapito Games, MD  promethazine (PHENERGAN) 25 MG tablet Take 1 tablet (25 mg total) by mouth every 8 (eight) hours as needed for nausea or vomiting. 06/05/19   Agapito Games, MD  propranolol (INDERAL) 40 MG tablet Take 1 tablet (40 mg total) by mouth 2 (two) times daily. 09/24/19   Agapito Games, MD  sertraline (ZOLOFT) 100 MG tablet Take 1 tablet (100 mg total) by mouth daily. 11/22/19   Agapito Games, MD  triamcinolone ointment (KENALOG) 0.5 % Apply 1 application topically 2 (two) times daily. To lesion in on the gum. 11/22/19   Agapito Games, MD    Family History Family History  Problem Relation Age of Onset  . Diabetes Mother   . Hyperlipidemia Mother   . Heart disease Mother 59  . Hypertension Mother   . Diabetes Father   . Hyperlipidemia Father   . Tremor Father        hand- started in his 20's  . Hypertension Father   . Tremor Cousin     Social History Social History   Tobacco Use  . Smoking status: Never Smoker  . Smokeless tobacco: Never Used  Substance Use Topics  . Alcohol use: No    Alcohol/week: 0.0 standard drinks  . Drug use: No     Allergies   Hydrocodone-acetaminophen, Codeine, Cymbalta [duloxetine hcl], Doxycycline, Effexor [venlafaxine], Guaifenesin, Meperidine, Meperidine hcl, Metronidazole, Morphine, Penicillins, Prednisone, Propoxyphene, Prozac [fluoxetine hcl], and Ropinirole   Review of Systems Review of Systems  Constitutional: Negative.   Skin: Positive for wound.    Neurological: Negative for dizziness, syncope, facial asymmetry, speech difficulty, weakness, light-headedness and headaches.     Physical Exam Triage Vital Signs ED Triage Vitals  Enc Vitals Group     BP 01/17/20 1635 (S) (!) 179/103     Pulse Rate 01/17/20 1635 98     Resp 01/17/20 1635 18     Temp 01/17/20 1635 97.9 F (36.6 C)     Temp Source 01/17/20 1635 Oral     SpO2 01/17/20 1635 97 %     Weight --      Height --      Head Circumference --      Peak Flow --      Pain Score 01/17/20 1633 8     Pain Loc --      Pain Edu? --  Excl. in [place]? --    No data found.  Updated Vital Signs BP (S) (!) 179/103 (BP Location: Right Arm)   Pulse 98   Temp 97.9 F (36.6 C) (Oral)   Resp 18   SpO2 97%   Visual Acuity Right Eye Distance:   Left Eye Distance:   Bilateral Distance:    Right Eye Near:   Left Eye Near:    Bilateral Near:     Physical Exam Vitals and nursing note reviewed.  Constitutional:      General: She is not in acute distress.    Appearance: She is not ill-appearing.  HENT:     Head:     Jaw: No swelling.      Comments: Claudie Fisherman has a 3.5cm long laceration as noted on diagram.  Superior aspect of laceration is at the lower vermilion border.  Minimal swelling present.    Right Ear: External ear normal.     Left Ear: External ear normal.     Nose: Nose normal.     Mouth/Throat:     Mouth: Mucous membranes are moist. No injury, lacerations or oral lesions.     Pharynx: Oropharynx is clear.     Comments: No injuries inside mouth Eyes:     Extraocular Movements: Extraocular movements intact.     Pupils: Pupils are equal, round, and reactive to light.  Cardiovascular:     Rate and Rhythm: Normal rate.  Pulmonary:     Effort: Pulmonary effort is normal.  Musculoskeletal:     Cervical back: Normal range of motion. No tenderness.  Skin:    General: Skin is warm and dry.  Neurological:     General: No focal deficit present.     Mental Status: She  is alert and oriented to person, place, and time.      UC Treatments / Results  Labs (all labs ordered are listed, but only abnormal results are displayed) Labs Reviewed - No data to display  EKG   Radiology No results found.  Procedures Procedures  Laceration Repair Discussed benefits and risks of procedure and verbal consent obtained. Wound pre-treated with LET topical anesthesia. Using sterile technique and local anesthesia with 1% lidocaine with epinephrine, cleansed wound with Betadine followed by copious lavage with normal saline.  Wound carefully inspected for debris and foreign bodies; none found.  Vermilion border aligned and wound closed with #13, 6-0 interrupted nylon sutures.  Bacitracin and non-stick sterile dressing applied.  Wound precautions explained to patient.  Return for suture removal in one week.  Medications Ordered in UC Medications  lidocaine-EPINEPHrine-tetracaine (LET) topical gel (3 mLs Topical Given 01/17/20 1650)    Initial Impression / Assessment and Plan / UC Course  I have reviewed the triage vital signs and the nursing notes.  Pertinent labs & imaging results that were available during my care of the patient were reviewed by me and considered in my medical decision making (see chart for details).    As a precaution, will begin Keflex (patient reports she has had no adverse reactions in the past). Return tomorrow for brief re-check; sutures out in 7 days.   Final Clinical Impressions(s) / UC Diagnoses   Final diagnoses:  Laceration of chin, initial encounter     Discharge Instructions     Leave today's bandage in place until followup visit tomorrow.  May take Tylenol if needed for pain.     ED Prescriptions    None  Lattie Haw, MD 01/18/20 [id number]

## 2020-01-17 NOTE — Discharge Instructions (Addendum)
Leave today's bandage in place until followup visit tomorrow.  May take Tylenol if needed for pain.

## 2020-01-17 NOTE — ED Triage Notes (Signed)
Patient presents to Urgent Care with complaints of lower lip laceration since about an hour ago. Patient reports she tripped going down some stairs with too many things in her hand and did not have a railing to hold onto. Pt denies blood thinners, laceration approx 1"long, bleeding continues, pt applying pressure upon arrival.

## 2020-01-18 ENCOUNTER — Telehealth: Payer: Self-pay

## 2020-01-18 ENCOUNTER — Emergency Department (INDEPENDENT_AMBULATORY_CARE_PROVIDER_SITE_OTHER)
Admission: EM | Admit: 2020-01-18 | Discharge: 2020-01-18 | Disposition: A | Discharge status: Home / Self Care | Payer: Medicare Other | Source: Home / Self Care

## 2020-01-18 DIAGNOSIS — Z5189 Encounter for other specified aftercare: Secondary | ICD-10-CM | POA: Diagnosis not present

## 2020-01-18 MED ORDER — FLUCONAZOLE 150 MG PO TABS: ORAL_TABLET | ORAL | 0 refills | Status: DC

## 2020-01-18 NOTE — Telephone Encounter (Signed)
Pt called requesting antifungal for yeast since pt was prescribed abx with today's visit. Erin PA authorized rx for diflucan and to instruct pt to take only if having symptoms. Rx sent to [facility] [address]

## 2020-01-18 NOTE — Discharge Instructions (Signed)
  You may change your bandage once daily to keep the area protected but you do not have to wear it all the time when at home.   You may want to wear a bandage to bed to help prevent dried blood getting onto your sheets.  Please return in 6 days for suture removal. Follow up sooner if concern for infection.

## 2020-01-18 NOTE — ED Triage Notes (Signed)
Pt here today to have laceration repair on chin rechecked per Dr Cathren Harsh. Pt taking antibiotic and tylenol as needed. Pt did not take BP med this morning so is a little hypertensive. Pain 5/10

## 2020-01-18 NOTE — ED Provider Notes (Signed)
[facility]    CSN: [id number] Arrival date & time: 01/18/20  1116      History   Chief Complaint Chief Complaint  Patient presents with  . Wound Check    HPI Nancy Wilson is a 79 y.o. female.   HPI  Nancy Wilson is a 79 y.o. female presenting to UC for a wound recheck of a large laceration on her chin from a trip and fall yesterday. Pt received 13 sutures at [facility] yesterday and was started on Keflex. Pt states she has mild nausea with her antibiotic but is taking with food as prescribed.  Pain is mild today. No concerns.   Past Medical History:  Diagnosis Date  . Allergy   . Depression    long standing  . Hyperlipidemia   . Hypertension     Patient Active Problem List   Diagnosis Date Noted  . Gait instability 06/05/2019  . Sacroiliac joint pain 12/28/2018  . Lumbar facet joint pain 12/28/2018  . Encounter for long-term use of opiate analgesic 12/28/2018  . Closed compression fracture of L1 lumbar vertebra, initial encounter ([facility]) 12/28/2018  . Age-related osteoporosis with current pathological fracture 12/28/2018  . Lumbar foraminal stenosis 12/28/2018  . Wrist fracture, closed, right, initial encounter 12/13/2018  . Depression with anxiety 11/29/2018  . RLS (restless legs syndrome) 11/29/2018  . Frequent falls 09/21/2018  . OAB (overactive bladder) 05/30/2017  . Benign essential tremor 04/18/2017  . Posterior vitreous detachment of left eye 05/20/2016  . Trigger middle finger of right hand 12/01/2015  . Bilateral plantar fasciitis 08/09/2014  . Osteoarthritis of both ankles 08/09/2014  . S/P laparoscopic appendectomy 12/13/2011  . Night sweats 12/13/2011  . Herpes simplex type II infection 09/23/2011  . Knee pain 08/23/2011  . BENIGN POSITIONAL VERTIGO 04/24/2010  . Hyperlipidemia 03/28/2009  . FAMILIAL TREMOR 03/28/2009  . HERPES GENITALIS 04/03/2008  . Hypothyroidism 04/03/2008  . Vitamin D deficiency 04/03/2008  . HYPERTENSION, BENIGN  04/03/2008  . GERD 04/03/2008  . HIATAL HERNIA 04/03/2008  . DIVERTICULOSIS OF COLON 04/03/2008  . CKD (chronic kidney disease) stage 3, GFR 30-59 ml/min (HCC) 04/03/2008  . OSTEOARTHRITIS 04/03/2008  . OSTEOPENIA 04/03/2008  . EDEMA 04/03/2008  . Migraine headache without aura 04/03/2008    Past Surgical History:  Procedure Laterality Date  . carpel tunnel release, both hands  1980's  . DILATION AND CURETTAGE OF UTERUS    . FRACTURE SURGERY     fell and broke right arm  . GASTROSCOPY  11-06  . trigger finger release, left finger    . TUBAL LIGATION     bilateral    OB History   No obstetric history on file.      Home Medications    Prior to Admission medications   Medication Sig Start Date End Date Taking? Authorizing Provider  acyclovir (ZOVIRAX) 400 MG tablet TAKE 1 TABLET BY MOUTH AT  BEDTIME 11/05/19   Agapito Games, MD  alendronate (FOSAMAX) 70 MG tablet Take 1 tablet (70 mg total) by mouth every 7 (seven) days. Take with a full glass of water on an empty stomach. 08/20/19   Agapito Games, MD  amLODipine (NORVASC) 10 MG tablet Take 1 tablet (10 mg total) by mouth daily. 09/24/19   Agapito Games, MD  atorvastatin (LIPITOR) 40 MG tablet Take 1 tablet (40 mg total) by mouth daily. 09/24/19   Agapito Games, MD  buPROPion (WELLBUTRIN XL) 300 MG 24 hr tablet Take 1  tablet (300 mg total) by mouth daily. 09/24/19   Hali Marry, MD  carbidopa-levodopa (SINEMET IR) 25-100 MG tablet TAKE 1 TABLET BY MOUTH  DAILY AT BEDTIME 01/03/20   Hali Marry, MD  cephALEXin [facility]) 250 MG capsule Take one cap PO Q8hr 01/17/20   Kandra Nicolas, MD  Cholecalciferol (VITAMIN D3) 2000 units capsule Take 2,000 Units by mouth daily.    [provider]  eletriptan (RELPAX) 20 MG tablet Take 1 tablet (20 mg total) by mouth as needed for migraine or headache. May repeat in 2 hours if headache persists or recurs. 04/09/19   Hali Marry, MD    oxybutynin (DITROPAN) 5 MG tablet Take 1 tablet (5 mg total) by mouth 2 (two) times daily. 09/24/19   Hali Marry, MD  promethazine (PHENERGAN) 25 MG tablet Take 1 tablet (25 mg total) by mouth every 8 (eight) hours as needed for nausea or vomiting. 06/05/19   Hali Marry, MD  propranolol (INDERAL) 40 MG tablet Take 1 tablet (40 mg total) by mouth 2 (two) times daily. 09/24/19   Hali Marry, MD  sertraline (ZOLOFT) 100 MG tablet Take 1 tablet (100 mg total) by mouth daily. 11/22/19   Hali Marry, MD  triamcinolone ointment (KENALOG) 0.5 % Apply 1 application topically 2 (two) times daily. To lesion in on the gum. 11/22/19   Hali Marry, MD    Family History Family History  Problem Relation Age of Onset  . Diabetes Mother   . Hyperlipidemia Mother   . Heart disease Mother 11  . Hypertension Mother   . Diabetes Father   . Hyperlipidemia Father   . Tremor Father        hand- started in his 37's  . Hypertension Father   . Tremor Cousin     Social History Social History   Tobacco Use  . Smoking status: Never Smoker  . Smokeless tobacco: Never Used  Substance Use Topics  . Alcohol use: No    Alcohol/week: 0.0 standard drinks  . Drug use: No     Allergies   Hydrocodone-acetaminophen, Codeine, Cymbalta [duloxetine hcl], Doxycycline, Effexor [venlafaxine], Guaifenesin, Meperidine, Meperidine hcl, Metronidazole, Morphine, Penicillins, Prednisone, Propoxyphene, Prozac [fluoxetine hcl], and Ropinirole   Review of Systems Review of Systems  Gastrointestinal: Positive for nausea. Negative for vomiting.  Skin: Positive for wound.  Neurological: Negative for dizziness and headaches.     Physical Exam Triage Vital Signs ED Triage Vitals [01/18/20 1131]  Enc Vitals Group     BP (!) 160/80     Pulse Rate 84     Resp 18     Temp 98 F (36.7 C)     Temp Source Oral     SpO2 96 %     Weight      Height      Head Circumference      Peak  Flow      Pain Score 5     Pain Loc      Pain Edu?      Excl. in [place]?    No data found.  Updated Vital Signs BP (!) 160/80 (BP Location: Right Arm)   Pulse 84   Temp 98 F (36.7 C) (Oral)   Resp 18   SpO2 96%      Physical Exam Vitals and nursing note reviewed.  Constitutional:      Appearance: Normal appearance.  HENT:     Head: Normocephalic.   Cardiovascular:  Rate and Rhythm: Normal rate.  Neurological:     General: No focal deficit present.     Mental Status: She is alert.      UC Treatments / Results  Labs (all labs ordered are listed, but only abnormal results are displayed) Labs Reviewed - No data to display  EKG   Radiology No results found.  Procedures Procedures (including critical care time)  Medications Ordered in UC Medications - No data to display  Initial Impression / Assessment and Plan / UC Course  I have reviewed the triage vital signs and the nursing notes.  Pertinent labs & imaging results that were available during my care of the patient were reviewed by me and considered in my medical decision making (see chart for details).     Wound appears to be healing well. Wound gently cleaned with saline.  Bacitracin and new bandage applied. Pt may change bandage daily.  F/u in 6 days for suture removal  Pt understanding and agreeable with tx plan. AVS provided.  Final Clinical Impressions(s) / UC Diagnoses   Final diagnoses:  Visit for wound check     Discharge Instructions      You may change your bandage once daily to keep the area protected but you do not have to wear it all the time when at home.   You may want to wear a bandage to bed to help prevent dried blood getting onto your sheets.  Please return in 6 days for suture removal. Follow up sooner if concern for infection.     ED Prescriptions    None     PDMP not reviewed this encounter.   Lurene Shadow, [place] 01/18/20 1242

## 2020-01-23 ENCOUNTER — Emergency Department (INDEPENDENT_AMBULATORY_CARE_PROVIDER_SITE_OTHER)
Admission: EM | Admit: 2020-01-23 | Discharge: 2020-01-23 | Disposition: A | Discharge status: Home / Self Care | Payer: Medicare Other | Source: Home / Self Care

## 2020-01-23 DIAGNOSIS — M65311 Trigger thumb, right thumb: Secondary | ICD-10-CM | POA: Diagnosis not present

## 2020-01-23 DIAGNOSIS — Z4802 Encounter for removal of sutures: Secondary | ICD-10-CM

## 2020-01-23 DIAGNOSIS — M1811 Unilateral primary osteoarthritis of first carpometacarpal joint, right hand: Secondary | ICD-10-CM | POA: Diagnosis not present

## 2020-01-23 NOTE — ED Triage Notes (Addendum)
Here today for suture removal. Pt is hypertensive today. Says she hasnt taken BP meds today yet

## 2020-01-23 NOTE — ED Provider Notes (Signed)
[facility]    CSN: [id number] Arrival date & time: 01/23/20  1458      History   Chief Complaint Chief Complaint  Patient presents with  . Suture / Staple Removal    HPI Nancy Wilson is a 79 y.o. female.   HPI Nancy Wilson is a 79 y.o. female presenting to UC for suture removal. Sutures were place at [facility] on 01/17/20. She has been taking her Keflex as prescribed to help prevent infection. She is not sure if the sutures are ready to come out or not. Mild chapped lips and irritation at the end of the laceration over her lip but otherwise, no concerns today.    Past Medical History:  Diagnosis Date  . Allergy   . Depression    long standing  . Hyperlipidemia   . Hypertension     Patient Active Problem List   Diagnosis Date Noted  . Gait instability 06/05/2019  . Sacroiliac joint pain 12/28/2018  . Lumbar facet joint pain 12/28/2018  . Encounter for long-term use of opiate analgesic 12/28/2018  . Closed compression fracture of L1 lumbar vertebra, initial encounter (HCC) 12/28/2018  . Age-related osteoporosis with current pathological fracture 12/28/2018  . Lumbar foraminal stenosis 12/28/2018  . Wrist fracture, closed, right, initial encounter 12/13/2018  . Depression with anxiety 11/29/2018  . RLS (restless legs syndrome) 11/29/2018  . Frequent falls 09/21/2018  . OAB (overactive bladder) 05/30/2017  . Benign essential tremor 04/18/2017  . Posterior vitreous detachment of left eye 05/20/2016  . Trigger middle finger of right hand 12/01/2015  . Bilateral plantar fasciitis 08/09/2014  . Osteoarthritis of both ankles 08/09/2014  . S/P laparoscopic appendectomy 12/13/2011  . Night sweats 12/13/2011  . Herpes simplex type II infection 09/23/2011  . Knee pain 08/23/2011  . BENIGN POSITIONAL VERTIGO 04/24/2010  . Hyperlipidemia 03/28/2009  . FAMILIAL TREMOR 03/28/2009  . HERPES GENITALIS 04/03/2008  . Hypothyroidism 04/03/2008  . Vitamin D deficiency  04/03/2008  . HYPERTENSION, BENIGN 04/03/2008  . GERD 04/03/2008  . HIATAL HERNIA 04/03/2008  . DIVERTICULOSIS OF COLON 04/03/2008  . CKD (chronic kidney disease) stage 3, GFR 30-59 ml/min (HCC) 04/03/2008  . OSTEOARTHRITIS 04/03/2008  . OSTEOPENIA 04/03/2008  . EDEMA 04/03/2008  . Migraine headache without aura 04/03/2008    Past Surgical History:  Procedure Laterality Date  . carpel tunnel release, both hands  1980's  . DILATION AND CURETTAGE OF UTERUS    . FRACTURE SURGERY     fell and broke right arm  . GASTROSCOPY  11-06  . trigger finger release, left finger    . TUBAL LIGATION     bilateral    OB History   No obstetric history on file.      Home Medications    Prior to Admission medications   Medication Sig Start Date End Date Taking? Authorizing Provider  acyclovir (ZOVIRAX) 400 MG tablet TAKE 1 TABLET BY MOUTH AT  BEDTIME 11/05/19   Agapito Games, MD  alendronate (FOSAMAX) 70 MG tablet Take 1 tablet (70 mg total) by mouth every 7 (seven) days. Take with a full glass of water on an empty stomach. 08/20/19   Agapito Games, MD  amLODipine (NORVASC) 10 MG tablet Take 1 tablet (10 mg total) by mouth daily. 09/24/19   Agapito Games, MD  atorvastatin (LIPITOR) 40 MG tablet Take 1 tablet (40 mg total) by mouth daily. 09/24/19   Agapito Games, MD  buPROPion (WELLBUTRIN XL) 300 MG  24 hr tablet Take 1 tablet (300 mg total) by mouth daily. 09/24/19   Hali Marry, MD  carbidopa-levodopa (SINEMET IR) 25-100 MG tablet TAKE 1 TABLET BY MOUTH  DAILY AT BEDTIME 01/03/20   Hali Marry, MD  cephALEXin [facility]) 250 MG capsule Take one cap PO Q8hr 01/17/20   Kandra Nicolas, MD  Cholecalciferol (VITAMIN D3) 2000 units capsule Take 2,000 Units by mouth daily.    [provider]  eletriptan (RELPAX) 20 MG tablet Take 1 tablet (20 mg total) by mouth as needed for migraine or headache. May repeat in 2 hours if headache persists or recurs.  04/09/19   Hali Marry, MD  fluconazole (DIFLUCAN) 150 MG tablet Please take only if symptomatic 01/18/20   Noe Gens, PA-C  oxybutynin (DITROPAN) 5 MG tablet Take 1 tablet (5 mg total) by mouth 2 (two) times daily. 09/24/19   Hali Marry, MD  promethazine (PHENERGAN) 25 MG tablet Take 1 tablet (25 mg total) by mouth every 8 (eight) hours as needed for nausea or vomiting. 06/05/19   Hali Marry, MD  propranolol (INDERAL) 40 MG tablet Take 1 tablet (40 mg total) by mouth 2 (two) times daily. 09/24/19   Hali Marry, MD  sertraline (ZOLOFT) 100 MG tablet Take 1 tablet (100 mg total) by mouth daily. 11/22/19   Hali Marry, MD  triamcinolone ointment (KENALOG) 0.5 % Apply 1 application topically 2 (two) times daily. To lesion in on the gum. 11/22/19   Hali Marry, MD    Family History Family History  Problem Relation Age of Onset  . Diabetes Mother   . Hyperlipidemia Mother   . Heart disease Mother 13  . Hypertension Mother   . Diabetes Father   . Hyperlipidemia Father   . Tremor Father        hand- started in his 71's  . Hypertension Father   . Tremor Cousin     Social History Social History   Tobacco Use  . Smoking status: Never Smoker  . Smokeless tobacco: Never Used  Substance Use Topics  . Alcohol use: No    Alcohol/week: 0.0 standard drinks  . Drug use: No     Allergies   Hydrocodone-acetaminophen, Codeine, Cymbalta [duloxetine hcl], Doxycycline, Effexor [venlafaxine], Guaifenesin, Meperidine, Meperidine hcl, Metronidazole, Morphine, Penicillins, Prednisone, Propoxyphene, Prozac [fluoxetine hcl], and Ropinirole   Review of Systems Review of Systems  Skin: Positive for wound.     Physical Exam Triage Vital Signs ED Triage Vitals  Enc Vitals Group     BP 01/23/20 1522 (!) 165/105     Pulse Rate 01/23/20 1522 95     Resp 01/23/20 1522 16     Temp 01/23/20 1522 98.6 F (37 C)     Temp Source 01/23/20 1522 Oral      SpO2 01/23/20 1522 97 %     Weight --      Height --      Head Circumference --      Peak Flow --      Pain Score 01/23/20 1523 0     Pain Loc --      Pain Edu? --      Excl. in [place]? --    No data found.  Updated Vital Signs BP (!) 165/105 (BP Location: Right Arm)   Pulse 95   Temp 98.6 F (37 C) (Oral)   Resp 16   SpO2 97%   Visual Acuity Right Eye Distance:  Left Eye Distance:   Bilateral Distance:    Right Eye Near:   Left Eye Near:    Bilateral Near:     Physical Exam Vitals and nursing note reviewed.  Constitutional:      Appearance: Normal appearance.  HENT:     Mouth/Throat:   Neurological:     Mental Status: She is alert.      UC Treatments / Results  Labs (all labs ordered are listed, but only abnormal results are displayed) Labs Reviewed - No data to display  EKG   Radiology No results found.  Procedures Procedures (including critical care time)  Medications Ordered in UC Medications - No data to display  Initial Impression / Assessment and Plan / UC Course  I have reviewed the triage vital signs and the nursing notes.  Pertinent labs & imaging results that were available during my care of the patient were reviewed by me and considered in my medical decision making (see chart for details).     Sutures removed without complication by Idelia Salm, RN. Distal end of laceration still fragile. Wound is together, however, added 2 steri-strips to aid wound healing over the next 1-2 weeks. Home care instructions discussed and provided  Final Clinical Impressions(s) / UC Diagnoses   Final diagnoses:  Visit for suture removal     Discharge Instructions      You may apply vaseline or plain chap stick to your lower lip but do not put any ointments or creams on the steri-strips.  You may eat as normal but use caution while your skin continues to heal over the next 1-2 weeks.   If you are concerned for infection (increased pain,  redness, drainage or pus or fever) or if concern the wound is not healing completely, please follow up with family medicine, or return to urgent care if needed.     ED Prescriptions    None     PDMP not reviewed this encounter.   Lurene Shadow, [place] 01/23/20 1756

## 2020-01-23 NOTE — Discharge Instructions (Signed)
  You may apply vaseline or plain chap stick to your lower lip but do not put any ointments or creams on the steri-strips.  You may eat as normal but use caution while your skin continues to heal over the next 1-2 weeks.   If you are concerned for infection (increased pain, redness, drainage or pus or fever) or if concern the wound is not healing completely, please follow up with family medicine, or return to urgent care if needed.

## 2020-01-23 NOTE — ED Notes (Signed)
Thirteen sutures removed from patient's lower lip and chin. PA placed steri strips on lower portion of laceration for continued healing. Patient tolerated procedure well, verbalized understanding of wound care instructions.

## 2020-01-27 ENCOUNTER — Other Ambulatory Visit: Payer: Self-pay | Admitting: Family Medicine

## 2020-01-27 DIAGNOSIS — N3281 Overactive bladder: Secondary | ICD-10-CM

## 2020-01-27 DIAGNOSIS — I10 Essential (primary) hypertension: Secondary | ICD-10-CM

## 2020-01-29 ENCOUNTER — Other Ambulatory Visit: Payer: Self-pay | Admitting: Family Medicine

## 2020-01-29 DIAGNOSIS — N3281 Overactive bladder: Secondary | ICD-10-CM

## 2020-01-29 DIAGNOSIS — I10 Essential (primary) hypertension: Secondary | ICD-10-CM

## 2020-02-21 ENCOUNTER — Ambulatory Visit (INDEPENDENT_AMBULATORY_CARE_PROVIDER_SITE_OTHER): Payer: Medicare Other | Admitting: Family Medicine

## 2020-02-21 ENCOUNTER — Other Ambulatory Visit: Payer: Self-pay

## 2020-02-21 ENCOUNTER — Encounter: Payer: Self-pay | Admitting: Family Medicine

## 2020-02-21 ENCOUNTER — Telehealth: Payer: Self-pay | Admitting: *Deleted

## 2020-02-21 VITALS — BP 139/58 | HR 79 | Ht 60.0 in | Wt 160.0 lb

## 2020-02-21 DIAGNOSIS — G250 Essential tremor: Secondary | ICD-10-CM

## 2020-02-21 DIAGNOSIS — F418 Other specified anxiety disorders: Secondary | ICD-10-CM | POA: Diagnosis not present

## 2020-02-21 DIAGNOSIS — R296 Repeated falls: Secondary | ICD-10-CM | POA: Diagnosis not present

## 2020-02-21 DIAGNOSIS — I10 Essential (primary) hypertension: Secondary | ICD-10-CM | POA: Diagnosis not present

## 2020-02-21 NOTE — Progress Notes (Signed)
Established Patient Office Visit  Subjective:  Patient ID: Nancy Wilson, female    DOB: [birth year]  Age: 79 y.o. MRN: [id number]  CC:  Chief Complaint  Patient presents with  . mood    HPI Nancy Wilson presents for depression anxiety. We inc her zoloft about 3 months ato.   Hypertension- Pt denies chest pain, SOB, dizziness, or heart palpitations.  Taking meds as directed w/o problems.  Denies medication side effects.  She reports her blood pressures have still been a little bit elevated in the low 140s she is been staying away from salt.  She unfortunately still continues to have difficulty with frequent falls.  She actually saw neurology about a year and a half ago.  They were unable to determine the cause but it continues to happen.  She does report a little bit of weakness in her left foot.  She denies any numbness or tingling of her feet indicate neuropathy.  Though she does describe a sensation in the evenings when she takes off her stockings and or socks where it feels like there is still something wrapped around her foot.  She denies any lightheadedness or dizziness that could be triggering her symptoms.  Again see previous notes.  She is had multiple injuries from the falls.  She would like to see neurology again.  She does have to see a very specific list of providers that are in network for her..    Past Medical History:  Diagnosis Date  . Allergy   . Depression    long standing  . Hyperlipidemia   . Hypertension     Past Surgical History:  Procedure Laterality Date  . carpel tunnel release, both hands  1980's  . DILATION AND CURETTAGE OF UTERUS    . FRACTURE SURGERY     fell and broke right arm  . GASTROSCOPY  11-06  . trigger finger release, left finger    . TUBAL LIGATION     bilateral    Family History  Problem Relation Age of Onset  . Diabetes Mother   . Hyperlipidemia Mother   . Heart disease Mother 17  . Hypertension Mother   . Diabetes Father    . Hyperlipidemia Father   . Tremor Father        hand- started in his 16's  . Hypertension Father   . Tremor Cousin     Social History   Socioeconomic History  . Marital status: Married    Spouse name: Geographical information systems officer  . Number of children: 2  . Years of education: 9th  . Highest education level: 9th grade  Occupational History  . Occupation: Housewife    Comment: retired  Tobacco Use  . Smoking status: Never Smoker  . Smokeless tobacco: Never Used  Substance and Sexual Activity  . Alcohol use: No    Alcohol/week: 0.0 standard drinks  . Drug use: No  . Sexual activity: Not Currently  Other Topics Concern  . Not on file  Social History Narrative   No caffeine use. Doing physical therapy to help with her balance issue   Social Determinants of Health   Financial Resource Strain:   . Difficulty of Paying Living Expenses:   Food Insecurity:   . Worried About Programme researcher, broadcasting/film/video in the Last Year:   . Barista in the Last Year:   Transportation Needs:   . Freight forwarder (Medical):   Marland Kitchen Lack of Transportation (Non-Medical):   Physical  Activity:   . Days of Exercise per Week:   . Minutes of Exercise per Session:   Stress:   . Feeling of Stress :   Social Connections: Unknown  . Frequency of Communication with Friends and Family: Once a week  . Frequency of Social Gatherings with Friends and Family: Not on file  . Attends Religious Services: Not on file  . Active Member of Clubs or Organizations: Not on file  . Attends Archivist Meetings: Not on file  . Marital Status: Not on file  Intimate Partner Violence:   . Fear of Current or Ex-Partner:   . Emotionally Abused:   Marland Kitchen Physically Abused:   . Sexually Abused:     Outpatient Medications Prior to Visit  Medication Sig Dispense Refill  . acyclovir (ZOVIRAX) 400 MG tablet TAKE 1 TABLET BY MOUTH AT  BEDTIME 90 tablet 1  . alendronate (FOSAMAX) 70 MG tablet Take 1 tablet (70 mg total) by mouth every 7  (seven) days. Take with a full glass of water on an empty stomach. 12 tablet 4  . amLODipine (NORVASC) 10 MG tablet TAKE 1 TABLET BY MOUTH  DAILY 90 tablet 2  . atorvastatin (LIPITOR) 40 MG tablet Take 1 tablet (40 mg total) by mouth daily. 90 tablet 3  . buPROPion (WELLBUTRIN XL) 300 MG 24 hr tablet Take 1 tablet (300 mg total) by mouth daily. 90 tablet 3  . carbidopa-levodopa (SINEMET IR) 25-100 MG tablet TAKE 1 TABLET BY MOUTH  DAILY AT BEDTIME 90 tablet 3  . cephALEXin (KEFLEX) 250 MG capsule Take one cap PO Q8hr 21 capsule 0  . Cholecalciferol (VITAMIN D3) 2000 units capsule Take 2,000 Units by mouth daily.    Marland Kitchen eletriptan (RELPAX) 20 MG tablet Take 1 tablet (20 mg total) by mouth as needed for migraine or headache. May repeat in 2 hours if headache persists or recurs. 30 tablet 4  . oxybutynin (DITROPAN) 5 MG tablet TAKE 1 TABLET BY MOUTH  TWICE DAILY 180 tablet 2  . promethazine (PHENERGAN) 25 MG tablet Take 1 tablet (25 mg total) by mouth every 8 (eight) hours as needed for nausea or vomiting. 20 tablet 3  . propranolol (INDERAL) 40 MG tablet Take 1 tablet (40 mg total) by mouth 2 (two) times daily. 180 tablet 3  . sertraline (ZOLOFT) 100 MG tablet Take 1 tablet (100 mg total) by mouth daily. 90 tablet 1  . triamcinolone ointment (KENALOG) 0.5 % Apply 1 application topically 2 (two) times daily. To lesion in on the gum. 15 g 0  . fluconazole (DIFLUCAN) 150 MG tablet Please take only if symptomatic 1 tablet 0   No facility-administered medications prior to visit.    Allergies  Allergen Reactions  . Hydrocodone-Acetaminophen Hives  . Codeine Other (See Comments)  . Cymbalta [Duloxetine Hcl] Other (See Comments)  . Doxycycline   . Effexor [Venlafaxine] Other (See Comments)    Tearful  . Guaifenesin   . Meperidine Other (See Comments)  . Meperidine Hcl   . Metronidazole   . Morphine Other (See Comments)  . Penicillins   . Prednisone Other (See Comments)  . Propoxyphene Nausea And  Vomiting  . Prozac [Fluoxetine Hcl] Other (See Comments)  . Ropinirole Other (See Comments)    Sweaty and tachycardic    ROS Review of Systems    Objective:    Physical Exam  Constitutional: She is oriented to person, place, and time. She appears well-developed and well-nourished.  HENT:  Head:  Normocephalic and atraumatic.  Cardiovascular: Normal rate, regular rhythm and normal heart sounds.  Pulmonary/Chest: Effort normal and breath sounds normal.  Musculoskeletal:     Comments: Hip and knee strength is 5 out of 5.  Right ankle strength in all directions is 5-5.  She does have just slight weakness with flexion of the left ankle compared to her right.  Normal dorsiflexion.  Reflexes 1+ bilaterally.  Neurological: She is alert and oriented to person, place, and time.  Skin: Skin is warm and dry.  Psychiatric: She has a normal mood and affect. Her behavior is normal.    BP (!) 139/58   Pulse 79   Ht 5' (1.524 m)   Wt 160 lb (72.6 kg)   SpO2 100%   BMI 31.25 kg/m  Wt Readings from Last 3 Encounters:  02/21/20 160 lb (72.6 kg)  11/22/19 154 lb (69.9 kg)  09/24/19 145 lb (65.8 kg)     There are no preventive care reminders to display for this patient.  There are no preventive care reminders to display for this patient.  Lab Results  Component Value Date   TSH 2.88 11/29/2018   Lab Results  Component Value Date   WBC 6.0 11/30/2019   HGB 14.1 11/30/2019   HCT 42.8 11/30/2019   MCV 97.5 11/30/2019   PLT 252 11/30/2019   Lab Results  Component Value Date   NA 139 11/30/2019   K 5.2 11/30/2019   CO2 23 11/30/2019   GLUCOSE 101 (H) 11/30/2019   BUN 17 11/30/2019   CREATININE 1.17 (H) 11/30/2019   BILITOT 0.5 11/30/2019   ALKPHOS 92 12/02/2016   AST 22 11/30/2019   ALT 17 11/30/2019   PROT 6.6 11/30/2019   ALBUMIN 4.3 12/02/2016   CALCIUM 9.8 11/30/2019   Lab Results  Component Value Date   CHOL 229 (H) 11/30/2019   Lab Results  Component Value Date    HDL 61 11/30/2019   Lab Results  Component Value Date   LDLCALC 148 (H) 11/30/2019   Lab Results  Component Value Date   TRIG 94 11/30/2019   Lab Results  Component Value Date   CHOLHDL 3.8 11/30/2019   No results found for: HGBA1C    Assessment & Plan:   Problem List Items Addressed This Visit      Cardiovascular and Mediastinum   HYPERTENSION, BENIGN - Primary    Blood pressure okay today.  Like to see the systolic number in the mid to low 130s but it is acceptable based on her age.  We will continue to monitor carefully.        Nervous and Auditory   Familial tremor    On propranolol.        Other   Frequent falls    Still unclear etiology.  She did have just a little bit of weakness in the left foot with plantar flexion compared to the right.  But not significant enough that I feel like it should be contributing to frequent falls.  Again we will go ahead and place new referral to neurology as she continues to fall more frequently and again recurrent causing injuries.  She does describe a sensation of feeling like something is still on her foot after she takes off her stockings in the evenings so consider she could still have some element of neuropathy that she denies any true numbness or tingling in the feet or legs.      Depression with anxiety  She is actually been doing well with the 100 mg dose on the sertraline.  She says that her husband still really stresses her out.  She knows that is probably not going to change or improve..  But she does feel like her current regimen is helpful and would like to continue with it for now.  Her PHQ-9 score was 8 today and her GAD-7 score was 6.  No significant change from prior.         No orders of the defined types were placed in this encounter.   Follow-up: Return in about 3 months (around 05/23/2020) for BP/Mood.    Nani Gasser, MD

## 2020-02-21 NOTE — Telephone Encounter (Signed)
Pt left vm asking for a neuro referral for someone in her network.  She left a copy of possible providers with Tonya.  Referral placed.

## 2020-02-22 ENCOUNTER — Encounter: Payer: Self-pay | Admitting: Family Medicine

## 2020-02-22 NOTE — Assessment & Plan Note (Signed)
On propranolol.

## 2020-02-22 NOTE — Assessment & Plan Note (Addendum)
Still unclear etiology.  She did have just a little bit of weakness in the left foot with plantar flexion compared to the right.  But not significant enough that I feel like it should be contributing to frequent falls.  Again we will go ahead and place new referral to neurology as she continues to fall more frequently and again recurrent causing injuries.  She does describe a sensation of feeling like something is still on her foot after she takes off her stockings in the evenings so consider she could still have some element of neuropathy that she denies any true numbness or tingling in the feet or legs.

## 2020-02-22 NOTE — Assessment & Plan Note (Signed)
She is actually been doing well with the 100 mg dose on the sertraline.  She says that her husband still really stresses her out.  She knows that is probably not going to change or improve..  But she does feel like her current regimen is helpful and would like to continue with it for now.  Her PHQ-9 score was 8 today and her GAD-7 score was 6.  No significant change from prior.

## 2020-02-22 NOTE — Assessment & Plan Note (Signed)
Blood pressure okay today.  Like to see the systolic number in the mid to low 130s but it is acceptable based on her age.  We will continue to monitor carefully.

## 2020-03-31 ENCOUNTER — Other Ambulatory Visit: Payer: Self-pay | Admitting: Family Medicine

## 2020-04-02 DIAGNOSIS — Z1211 Encounter for screening for malignant neoplasm of colon: Secondary | ICD-10-CM | POA: Diagnosis not present

## 2020-04-02 DIAGNOSIS — K219 Gastro-esophageal reflux disease without esophagitis: Secondary | ICD-10-CM | POA: Diagnosis not present

## 2020-04-02 DIAGNOSIS — K9289 Other specified diseases of the digestive system: Secondary | ICD-10-CM | POA: Diagnosis not present

## 2020-04-08 DIAGNOSIS — K219 Gastro-esophageal reflux disease without esophagitis: Secondary | ICD-10-CM | POA: Diagnosis not present

## 2020-04-08 DIAGNOSIS — K449 Diaphragmatic hernia without obstruction or gangrene: Secondary | ICD-10-CM | POA: Diagnosis not present

## 2020-04-08 DIAGNOSIS — K228 Other specified diseases of esophagus: Secondary | ICD-10-CM | POA: Diagnosis not present

## 2020-04-09 ENCOUNTER — Other Ambulatory Visit: Payer: Self-pay | Admitting: Family Medicine

## 2020-04-15 DIAGNOSIS — M47816 Spondylosis without myelopathy or radiculopathy, lumbar region: Secondary | ICD-10-CM | POA: Diagnosis not present

## 2020-04-15 DIAGNOSIS — S32010A Wedge compression fracture of first lumbar vertebra, initial encounter for closed fracture: Secondary | ICD-10-CM | POA: Diagnosis not present

## 2020-04-15 DIAGNOSIS — G894 Chronic pain syndrome: Secondary | ICD-10-CM | POA: Diagnosis not present

## 2020-04-15 DIAGNOSIS — M545 Low back pain: Secondary | ICD-10-CM | POA: Diagnosis not present

## 2020-04-24 DIAGNOSIS — Z1211 Encounter for screening for malignant neoplasm of colon: Secondary | ICD-10-CM | POA: Diagnosis not present

## 2020-04-29 DIAGNOSIS — R258 Other abnormal involuntary movements: Secondary | ICD-10-CM | POA: Diagnosis not present

## 2020-04-29 DIAGNOSIS — R296 Repeated falls: Secondary | ICD-10-CM | POA: Diagnosis not present

## 2020-05-07 DIAGNOSIS — I6781 Acute cerebrovascular insufficiency: Secondary | ICD-10-CM | POA: Diagnosis not present

## 2020-05-07 DIAGNOSIS — G319 Degenerative disease of nervous system, unspecified: Secondary | ICD-10-CM | POA: Diagnosis not present

## 2020-05-12 ENCOUNTER — Other Ambulatory Visit: Payer: Self-pay | Admitting: Family Medicine

## 2020-05-22 ENCOUNTER — Ambulatory Visit (INDEPENDENT_AMBULATORY_CARE_PROVIDER_SITE_OTHER): Payer: Medicare Other | Admitting: Family Medicine

## 2020-05-22 ENCOUNTER — Encounter: Payer: Self-pay | Admitting: Family Medicine

## 2020-05-22 VITALS — BP 171/77 | HR 84 | Ht 60.0 in | Wt 167.0 lb

## 2020-05-22 DIAGNOSIS — Z9181 History of falling: Secondary | ICD-10-CM

## 2020-05-22 DIAGNOSIS — I10 Essential (primary) hypertension: Secondary | ICD-10-CM

## 2020-05-22 DIAGNOSIS — R258 Other abnormal involuntary movements: Secondary | ICD-10-CM

## 2020-05-22 DIAGNOSIS — S40022A Contusion of left upper arm, initial encounter: Secondary | ICD-10-CM

## 2020-05-22 DIAGNOSIS — F418 Other specified anxiety disorders: Secondary | ICD-10-CM

## 2020-05-22 DIAGNOSIS — N1831 Chronic kidney disease, stage 3a: Secondary | ICD-10-CM

## 2020-05-22 DIAGNOSIS — S60222A Contusion of left hand, initial encounter: Secondary | ICD-10-CM

## 2020-05-22 NOTE — Assessment & Plan Note (Signed)
Following renal function every 6 months.  Due today.

## 2020-05-22 NOTE — Assessment & Plan Note (Signed)
Well controlled. Continue current regimen. Follow up in  6 months. Due for BMP today.

## 2020-05-22 NOTE — Assessment & Plan Note (Signed)
Bradykinesia-currently being evaluated with Dr. Mardene Sayer.  Fortunately MRI just showed some brain atrophy no other findings at this time.  Suspect possible Parkinson's.  She actually seems to be responding really well to Sinemet with improved tremor and less frequent falls which is fantastic.  I really appreciate Dr. Donell Sievert input into her care.

## 2020-05-22 NOTE — Progress Notes (Signed)
Established Patient Office Visit  Subjective:  Patient ID: Nancy Wilson, female    DOB: [birth year]  Age: 79 y.o. MRN: [id number]  CC:  Chief Complaint  Patient presents with  . Hypertension    HPI Nancy Wilson presents for   Hypertension- Pt denies chest pain, SOB, dizziness, or heart palpitations.  Taking meds as directed w/o problems.  Denies medication side effects.   F/U depression and anxiety -she actually reports that she is doing really well with her medication regimen and she is happy with her dose and would like to continue it.  Follow-up frequent falls - she did have an appoint with Dr. Mardene Sayer, neurology over at [facility].  He has put her on Sinemet 3 times daily.  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  They have diagnosed her with possible Parkinson's but they are still doing additional work-up.  They have put her on a trial of Sinemet.  So ordered brain MRI for which she went on August 18.  It just showed no acute abnormalities but some moderate cerebral atrophy and leukoaraiosis.     She also wanted to ask about clot she said she has been getting some on her lower legs she said a couple of weeks ago it actually was red and warm but eventually resolved she usually does wear compression stockings she just did not wear them today because she wanted me to look at her legs.  No recent significant pain or swelling.  Fortunately she also fell again last week but she says the only time she has fallen since being on the Sinemet which she thinks is great.  But unfortunately she hit the left lateral outer shoulder as well as her left hand.  She has not had any decreased range of motion in the joints but just says it is been sore but feels like it is getting a lot better.  Past Medical History:  Diagnosis Date  . Allergy   . Depression    long standing  . Hyperlipidemia   . Hypertension      Past Surgical History:  Procedure Laterality Date  . carpel tunnel release, both hands  1980's  . DILATION AND CURETTAGE OF UTERUS    . FRACTURE SURGERY     fell and broke right arm  . GASTROSCOPY  11-06  . trigger finger release, left finger    . TUBAL LIGATION     bilateral    Family History  Problem Relation Age of Onset  . Diabetes Mother   . Hyperlipidemia Mother   . Heart disease Mother 11  . Hypertension Mother   . Diabetes Father   . Hyperlipidemia Father   . Tremor Father        hand- started in his 78's  . Hypertension Father   . Tremor Cousin     Social History   Socioeconomic History  . Marital status: Married    Spouse name: Geographical information systems officer  . Number of children: 2  . Years of education: 9th  . Highest education level: 9th grade  Occupational History  . Occupation: Housewife    Comment: retired  Tobacco Use  . Smoking status: Never Smoker  . Smokeless tobacco: Never Used  Vaping Use  . Vaping Use: Never used  Substance and Sexual Activity  . Alcohol use: No    Alcohol/week: 0.0 standard drinks  . Drug use:  No  . Sexual activity: Not Currently  Other Topics Concern  . Not on file  Social History Narrative   No caffeine use. Doing physical therapy to help with her balance issue   Social Determinants of Health   Financial Resource Strain:   . Difficulty of Paying Living Expenses: Not on file  Food Insecurity:   . Worried About Programme researcher, broadcasting/film/video in the Last Year: Not on file  . Ran Out of Food in the Last Year: Not on file  Transportation Needs:   . Lack of Transportation (Medical): Not on file  . Lack of Transportation (Non-Medical): Not on file  Physical Activity:   . Days of Exercise per Week: Not on file  . Minutes of Exercise per Session: Not on file  Stress:   . Feeling of Stress : Not on file  Social Connections: Unknown  . Frequency of Communication with Friends and Family: Once a week  . Frequency of Social Gatherings with Friends  and Family: Not on file  . Attends Religious Services: Not on file  . Active Member of Clubs or Organizations: Not on file  . Attends Banker Meetings: Not on file  . Marital Status: Not on file  Intimate Partner Violence:   . Fear of Current or Ex-Partner: Not on file  . Emotionally Abused: Not on file  . Physically Abused: Not on file  . Sexually Abused: Not on file    Outpatient Medications Prior to Visit  Medication Sig Dispense Refill  . acyclovir (ZOVIRAX) 400 MG tablet TAKE 1 TABLET BY MOUTH AT  BEDTIME 90 tablet 1  . alendronate (FOSAMAX) 70 MG tablet Take 1 tablet (70 mg total) by mouth every 7 (seven) days. Take with a full glass of water on an empty stomach. 12 tablet 4  . amLODipine (NORVASC) 10 MG tablet TAKE 1 TABLET BY MOUTH  DAILY 90 tablet 2  . atorvastatin (LIPITOR) 40 MG tablet Take 1 tablet (40 mg total) by mouth daily. 90 tablet 3  . buPROPion (WELLBUTRIN XL) 300 MG 24 hr tablet Take 1 tablet (300 mg total) by mouth daily. 90 tablet 3  . carbidopa-levodopa (SINEMET IR) 25-100 MG tablet TAKE 1 TABLET BY MOUTH  DAILY AT BEDTIME 90 tablet 3  . Cholecalciferol (VITAMIN D3) 2000 units capsule Take 2,000 Units by mouth daily.    Marland Kitchen oxybutynin (DITROPAN) 5 MG tablet TAKE 1 TABLET BY MOUTH  TWICE DAILY 180 tablet 2  . promethazine (PHENERGAN) 25 MG tablet Take 1 tablet (25 mg total) by mouth every 8 (eight) hours as needed for nausea or vomiting. 20 tablet 3  . propranolol (INDERAL) 40 MG tablet Take 1 tablet (40 mg total) by mouth 2 (two) times daily. 180 tablet 3  . sertraline (ZOLOFT) 100 MG tablet TAKE 1 TABLET BY MOUTH  DAILY 90 tablet 1  . cephALEXin (KEFLEX) 250 MG capsule Take one cap PO Q8hr 21 capsule 0  . eletriptan (RELPAX) 20 MG tablet Take 1 tablet (20 mg total) by mouth as needed for migraine or headache. May repeat in 2 hours if headache persists or recurs. 30 tablet 4  . triamcinolone ointment (KENALOG) 0.5 % Apply 1 application topically 2 (two)  times daily. To lesion in on the gum. 15 g 0   No facility-administered medications prior to visit.    Allergies  Allergen Reactions  . Hydrocodone-Acetaminophen Hives  . Codeine Other (See Comments)  . Cymbalta [Duloxetine Hcl] Other (See Comments)  .  Doxycycline   . Effexor [Venlafaxine] Other (See Comments)    Tearful  . Guaifenesin   . Meperidine Other (See Comments)  . Meperidine Hcl   . Metronidazole   . Morphine Other (See Comments)  . Penicillins   . Prednisone Other (See Comments)  . Propoxyphene Nausea And Vomiting  . Prozac [Fluoxetine Hcl] Other (See Comments)  . Ropinirole Other (See Comments)    Sweaty and tachycardic    ROS Review of Systems    Objective:    Physical Exam Constitutional:      Appearance: She is well-developed.  HENT:     Head: Normocephalic and atraumatic.  Cardiovascular:     Rate and Rhythm: Normal rate and regular rhythm.     Heart sounds: Normal heart sounds.  Pulmonary:     Effort: Pulmonary effort is normal.     Breath sounds: Normal breath sounds.  Skin:    General: Skin is warm and dry.     Comments: A large bruise on her left upper shoulder but she has normal range of motion she also has a large bruise on the posterior left hand over the fourth and fifth metacarpals.  She is nontender on exam and has normal range of motion of wrist and fingers.  Neurological:     Mental Status: She is alert and oriented to person, place, and time.  Psychiatric:        Behavior: Behavior normal.     BP (!) 171/77   Pulse 84   Ht 5' (1.524 m)   Wt 167 lb (75.8 kg)   SpO2 98%   BMI 32.61 kg/m  Wt Readings from Last 3 Encounters:  05/22/20 167 lb (75.8 kg)  02/21/20 160 lb (72.6 kg)  11/22/19 154 lb (69.9 kg)     Health Maintenance Due  Topic Date Due  . Hepatitis C Screening  Never done  . INFLUENZA VACCINE  04/20/2020    There are no preventive care reminders to display for this patient.  Lab Results  Component Value  Date   TSH 2.88 11/29/2018   Lab Results  Component Value Date   WBC 6.0 11/30/2019   HGB 14.1 11/30/2019   HCT 42.8 11/30/2019   MCV 97.5 11/30/2019   PLT 252 11/30/2019   Lab Results  Component Value Date   NA 139 11/30/2019   K 5.2 11/30/2019   CO2 23 11/30/2019   GLUCOSE 101 (H) 11/30/2019   BUN 17 11/30/2019   CREATININE 1.17 (H) 11/30/2019   BILITOT 0.5 11/30/2019   ALKPHOS 92 12/02/2016   AST 22 11/30/2019   ALT 17 11/30/2019   PROT 6.6 11/30/2019   ALBUMIN 4.3 12/02/2016   CALCIUM 9.8 11/30/2019   Lab Results  Component Value Date   CHOL 229 (H) 11/30/2019   Lab Results  Component Value Date   HDL 61 11/30/2019   Lab Results  Component Value Date   LDLCALC 148 (H) 11/30/2019   Lab Results  Component Value Date   TRIG 94 11/30/2019   Lab Results  Component Value Date   CHOLHDL 3.8 11/30/2019   No results found for: HGBA1C    Assessment & Plan:   Problem List Items Addressed This Visit      Cardiovascular and Mediastinum   HYPERTENSION, BENIGN - Primary    Well controlled. Continue current regimen. Follow up in  6 months. Due for BMP today.       Relevant Orders   BASIC METABOLIC PANEL WITH GFR  Genitourinary   CKD (chronic kidney disease) stage 3, GFR 30-59 ml/min (HCC)    Following renal function every 6 months.  Due today.      Relevant Orders   Urine Microalbumin w/creat. ratio   BASIC METABOLIC PANEL WITH GFR     Other   Depression with anxiety    PHQ-9 score of 3 today and GAD-7 score of 0.  Symptoms well controlled on current regimen.  Continue with sertraline and Wellbutrin.  Follow-up in 6 months.      Bradykinesia    Bradykinesia-currently being evaluated with Dr. Mardene SayerUnizi.  Fortunately MRI just showed some brain atrophy no other findings at this time.  Suspect possible Parkinson's.  She actually seems to be responding really well to Sinemet with improved tremor and less frequent falls which is fantastic.  I really  appreciate Dr. Donell Sievertunizi's input into her care.          Other Visit Diagnoses    History of recent fall       Contusion of left upper arm, initial encounter       Contusion of left hand, initial encounter         Fall - mostly contusions. No sign of fracture on exam.    No orders of the defined types were placed in this encounter.   Follow-up: Return in about 6 months (around 11/19/2020).    Nani Gasseratherine Mishal Probert, MD

## 2020-05-22 NOTE — Assessment & Plan Note (Signed)
PHQ-9 score of 3 today and GAD-7 score of 0.  Symptoms well controlled on current regimen.  Continue with sertraline and Wellbutrin.  Follow-up in 6 months.

## 2020-05-28 DIAGNOSIS — E039 Hypothyroidism, unspecified: Secondary | ICD-10-CM | POA: Diagnosis not present

## 2020-05-28 DIAGNOSIS — N1831 Chronic kidney disease, stage 3a: Secondary | ICD-10-CM | POA: Diagnosis not present

## 2020-05-30 LAB — BASIC METABOLIC PANEL WITH GFR
BUN/Creatinine Ratio: 12 (calc) (ref 6–22)
BUN: 15 mg/dL (ref 7–25)
CO2: 27 mmol/L (ref 20–32)
Calcium: 9.2 mg/dL (ref 8.6–10.4)
Chloride: 104 mmol/L (ref 98–110)
Creat: 1.21 mg/dL — ABNORMAL HIGH (ref 0.60–0.93)
GFR, Est African American: 50 mL/min/{1.73_m2} — ABNORMAL LOW (ref >=60)
GFR, Est Non African American: 43 mL/min/{1.73_m2} — ABNORMAL LOW (ref >=60)
Glucose, Bld: 101 mg/dL — ABNORMAL HIGH (ref 65–99)
Potassium: 4.6 mmol/L (ref 3.5–5.3)
Sodium: 137 mmol/L (ref 135–146)

## 2020-05-30 LAB — MICROALBUMIN / CREATININE URINE RATIO
Creatinine, Urine: 53 mg/dL (ref 20–275)
Microalb Creat Ratio: 9 mcg/mg creat (ref <30)
Microalb, Ur: 0.5 mg/dL

## 2020-05-30 LAB — TSH: TSH: 2.84 mIU/L (ref 0.40–4.50)

## 2020-06-20 ENCOUNTER — Other Ambulatory Visit: Payer: Self-pay | Admitting: Family Medicine

## 2020-06-24 DIAGNOSIS — R296 Repeated falls: Secondary | ICD-10-CM | POA: Diagnosis not present

## 2020-07-06 ENCOUNTER — Other Ambulatory Visit: Payer: Self-pay | Admitting: Family Medicine

## 2020-07-06 DIAGNOSIS — F341 Dysthymic disorder: Secondary | ICD-10-CM

## 2020-07-06 DIAGNOSIS — I10 Essential (primary) hypertension: Secondary | ICD-10-CM

## 2020-07-06 DIAGNOSIS — E785 Hyperlipidemia, unspecified: Secondary | ICD-10-CM

## 2020-07-09 DIAGNOSIS — M65311 Trigger thumb, right thumb: Secondary | ICD-10-CM | POA: Diagnosis not present

## 2020-07-18 DIAGNOSIS — M65311 Trigger thumb, right thumb: Secondary | ICD-10-CM | POA: Insufficient documentation

## 2020-08-05 ENCOUNTER — Other Ambulatory Visit: Payer: Self-pay | Admitting: Family Medicine

## 2020-08-08 DIAGNOSIS — M65311 Trigger thumb, right thumb: Secondary | ICD-10-CM | POA: Diagnosis not present

## 2020-08-08 DIAGNOSIS — M1990 Unspecified osteoarthritis, unspecified site: Secondary | ICD-10-CM | POA: Diagnosis not present

## 2020-08-08 DIAGNOSIS — G43909 Migraine, unspecified, not intractable, without status migrainosus: Secondary | ICD-10-CM | POA: Diagnosis not present

## 2020-08-08 DIAGNOSIS — Z881 Allergy status to other antibiotic agents status: Secondary | ICD-10-CM | POA: Diagnosis not present

## 2020-08-08 DIAGNOSIS — I10 Essential (primary) hypertension: Secondary | ICD-10-CM | POA: Diagnosis not present

## 2020-08-08 DIAGNOSIS — E785 Hyperlipidemia, unspecified: Secondary | ICD-10-CM | POA: Diagnosis not present

## 2020-08-08 DIAGNOSIS — Z880 Allergy status to penicillin: Secondary | ICD-10-CM | POA: Diagnosis not present

## 2020-08-08 DIAGNOSIS — Z888 Allergy status to other drugs, medicaments and biological substances status: Secondary | ICD-10-CM | POA: Diagnosis not present

## 2020-08-08 DIAGNOSIS — Z885 Allergy status to narcotic agent status: Secondary | ICD-10-CM | POA: Diagnosis not present

## 2020-08-08 DIAGNOSIS — K219 Gastro-esophageal reflux disease without esophagitis: Secondary | ICD-10-CM | POA: Diagnosis not present

## 2020-08-19 ENCOUNTER — Other Ambulatory Visit: Payer: Self-pay | Admitting: *Deleted

## 2020-08-19 ENCOUNTER — Other Ambulatory Visit: Payer: Self-pay | Admitting: Family Medicine

## 2020-08-19 DIAGNOSIS — I10 Essential (primary) hypertension: Secondary | ICD-10-CM

## 2020-08-19 DIAGNOSIS — G43009 Migraine without aura, not intractable, without status migrainosus: Secondary | ICD-10-CM

## 2020-08-19 MED ORDER — ELETRIPTAN HYDROBROMIDE 20 MG PO TABS: 20.0000 mg | ORAL_TABLET | ORAL | 4 refills | Status: DC | PRN

## 2020-09-10 ENCOUNTER — Other Ambulatory Visit: Payer: Self-pay | Admitting: Family Medicine

## 2020-09-10 DIAGNOSIS — N3281 Overactive bladder: Secondary | ICD-10-CM

## 2020-09-16 ENCOUNTER — Telehealth: Payer: Self-pay | Admitting: Family Medicine

## 2020-09-16 NOTE — Telephone Encounter (Signed)
Please call pt: received note from pharmacy saying she was having lower extremity swelling and could be a medication side effect. When did she start getting swelling? What have her BPS been recently.

## 2020-09-17 ENCOUNTER — Ambulatory Visit (INDEPENDENT_AMBULATORY_CARE_PROVIDER_SITE_OTHER): Payer: Medicare Other | Admitting: Family Medicine

## 2020-09-17 ENCOUNTER — Encounter: Payer: Self-pay | Admitting: Family Medicine

## 2020-09-17 ENCOUNTER — Other Ambulatory Visit: Payer: Self-pay

## 2020-09-17 VITALS — BP 162/83 | HR 79 | Temp 98.9°F | Ht 60.0 in | Wt 168.0 lb

## 2020-09-17 DIAGNOSIS — M7989 Other specified soft tissue disorders: Secondary | ICD-10-CM | POA: Diagnosis not present

## 2020-09-17 DIAGNOSIS — R5383 Other fatigue: Secondary | ICD-10-CM

## 2020-09-17 DIAGNOSIS — M791 Myalgia, unspecified site: Secondary | ICD-10-CM

## 2020-09-17 DIAGNOSIS — R7989 Other specified abnormal findings of blood chemistry: Secondary | ICD-10-CM

## 2020-09-17 DIAGNOSIS — I5030 Unspecified diastolic (congestive) heart failure: Secondary | ICD-10-CM | POA: Diagnosis not present

## 2020-09-17 MED ORDER — FUROSEMIDE 20 MG PO TABS: 20.0000 mg | ORAL_TABLET | Freq: Every day | ORAL | 0 refills | Status: DC | PRN

## 2020-09-17 NOTE — Progress Notes (Signed)
Bilateral foot swelling x1 wk. She cut the Amlodipine in half for the past 2 nights and hasn't noticed a difference with the swelling. She has also been propping her feet up which has helped some but not a lot .

## 2020-09-17 NOTE — Telephone Encounter (Signed)
Attempted to reach patient by phone. VM left requesting return call to discuss lower extremity swelling per Dr. Shelah Lewandowsky note.

## 2020-09-17 NOTE — Progress Notes (Signed)
Established Patient Office Visit  Subjective:  Patient ID: Nancy Wilson, female    DOB: 31-Aug-1941  Age: 79 y.o. MRN: [id number]  CC:  Chief Complaint  Patient presents with  . Foot Swelling    HPI Nancy Wilson presents for bilateral foot swelling x 1 week. Worse on the left.   C/O of fatigue as well.  She denies any recent changes to her medication regimen no change in increase in salt she does not feel like she was standing or doing more than usual last week.  She denies any chest pain or shortness of breath.  She denies any dizziness or lightheadedness.  She says the swelling is always a little bit better in the morning and gets worse as the day goes on she has been wearing her compression stockings at home but did not have them on today.  She did start splitting her amlodipine about 2 days ago but has not noticed any improvement in her swelling she has been on the same dose of amlodipine for about 3-1/2 years and has been on amlodipine in general for about 5 years.  Also feels like she has been getting some more muscle aches on the Lipitor and wants to know if she could cut it back a little bit.  Past Medical History:  Diagnosis Date  . Allergy   . Depression    long standing  . Hyperlipidemia   . Hypertension     Past Surgical History:  Procedure Laterality Date  . carpel tunnel release, both hands  1980's  . DILATION AND CURETTAGE OF UTERUS    . FRACTURE SURGERY     fell and broke right arm  . GASTROSCOPY  11-06  . trigger finger release, left finger    . TUBAL LIGATION     bilateral    Family History  Problem Relation Age of Onset  . Diabetes Mother   . Hyperlipidemia Mother   . Heart disease Mother 68  . Hypertension Mother   . Diabetes Father   . Hyperlipidemia Father   . Tremor Father        hand- started in his 65's  . Hypertension Father   . Tremor Cousin     Social History   Socioeconomic History  . Marital status: Married    Spouse name:  Geographical information systems officer  . Number of children: 2  . Years of education: 9th  . Highest education level: 9th grade  Occupational History  . Occupation: Housewife    Comment: retired  Tobacco Use  . Smoking status: Never Smoker  . Smokeless tobacco: Never Used  Vaping Use  . Vaping Use: Never used  Substance and Sexual Activity  . Alcohol use: No    Alcohol/week: 0.0 standard drinks  . Drug use: No  . Sexual activity: Not Currently  Other Topics Concern  . Not on file  Social History Narrative   No caffeine use. Doing physical therapy to help with her balance issue   Social Determinants of Health   Financial Resource Strain: Not on file  Food Insecurity: Not on file  Transportation Needs: Not on file  Physical Activity: Not on file  Stress: Not on file  Social Connections: Not on file  Intimate Partner Violence: Not on file    Outpatient Medications Prior to Visit  Medication Sig Dispense Refill  . acyclovir (ZOVIRAX) 400 MG tablet TAKE 1 TABLET BY MOUTH AT  BEDTIME 90 tablet 1  . alendronate (FOSAMAX) 70 MG tablet  Take 1 tablet (70 mg total) by mouth every 7 (seven) days. Take with a full glass of water on an empty stomach. 12 tablet 4  . amLODipine (NORVASC) 10 MG tablet TAKE 1 TABLET BY MOUTH  DAILY 90 tablet 3  . ASPIRIN 81 PO Take by mouth.    Marland Kitchen atorvastatin (LIPITOR) 40 MG tablet TAKE 1 TABLET BY MOUTH  DAILY 90 tablet 3  . buPROPion (WELLBUTRIN XL) 300 MG 24 hr tablet TAKE 1 TABLET BY MOUTH  DAILY 90 tablet 3  . carbidopa-levodopa (SINEMET IR) 25-100 MG tablet TAKE 1 TABLET BY MOUTH  DAILY AT BEDTIME 90 tablet 3  . Cholecalciferol (VITAMIN D3) 2000 units capsule Take 2,000 Units by mouth daily.    Marland Kitchen eletriptan (RELPAX) 20 MG tablet Take 1 tablet (20 mg total) by mouth as needed for migraine or headache. May repeat in 2 hours if headache persists or recurs. 12 tablet 4  . oxybutynin (DITROPAN) 5 MG tablet TAKE 1 TABLET BY MOUTH  TWICE DAILY 180 tablet 3  . promethazine (PHENERGAN) 25  MG tablet Take 1 tablet (25 mg total) by mouth every 8 (eight) hours as needed for nausea or vomiting. 20 tablet 3  . propranolol (INDERAL) 40 MG tablet TAKE 1 TABLET BY MOUTH  TWICE DAILY 180 tablet 3  . sertraline (ZOLOFT) 100 MG tablet TAKE 1 TABLET BY MOUTH  DAILY 90 tablet 3   No facility-administered medications prior to visit.    Allergies  Allergen Reactions  . Hydrocodone-Acetaminophen Hives  . Codeine Other (See Comments)  . Cymbalta [Duloxetine Hcl] Other (See Comments)  . Doxycycline   . Effexor [Venlafaxine] Other (See Comments)    Tearful  . Guaifenesin   . Meperidine Other (See Comments)  . Meperidine Hcl   . Metronidazole   . Morphine Other (See Comments)  . Penicillins   . Prednisone Other (See Comments)  . Propoxyphene Nausea And Vomiting  . Prozac [Fluoxetine Hcl] Other (See Comments)  . Ropinirole Other (See Comments)    Sweaty and tachycardic    ROS Review of Systems    Objective:    Physical Exam Constitutional:      Appearance: She is well-developed and well-nourished.  HENT:     Head: Normocephalic and atraumatic.  Cardiovascular:     Rate and Rhythm: Normal rate and regular rhythm.     Heart sounds: Normal heart sounds.  Pulmonary:     Effort: Pulmonary effort is normal.     Breath sounds: Normal breath sounds.  Musculoskeletal:     Comments: 1+ pitting edema both feet and ankles all the way up to her knees bilaterally.  She does have what looks like a little bit of phlebitis on that left inner leg over one of her bulging varicose veins.  But it is compressible.  Does have some mild erythema.  Skin:    General: Skin is warm and dry.  Neurological:     Mental Status: She is alert and oriented to person, place, and time.  Psychiatric:        Mood and Affect: Mood and affect normal.        Behavior: Behavior normal.     BP (!) 162/83   Pulse 79   Temp 98.9 F (37.2 C)   Ht 5' (1.524 m)   Wt 168 lb (76.2 kg)   SpO2 98%   BMI 32.81  kg/m  Wt Readings from Last 3 Encounters:  09/17/20 168 lb (76.2 kg)  05/22/20  167 lb (75.8 kg)  02/21/20 160 lb (72.6 kg)     Health Maintenance Due  Topic Date Due  . Hepatitis C Screening  Never done  . COVID-19 Vaccine (3 - Booster for Pfizer series) 06/26/2020    There are no preventive care reminders to display for this patient.  Lab Results  Component Value Date   TSH 2.84 05/28/2020   Lab Results  Component Value Date   WBC 5.8 09/17/2020   HGB 13.4 09/17/2020   HCT 39.9 09/17/2020   MCV 94.8 09/17/2020   PLT 261 09/17/2020   Lab Results  Component Value Date   NA 137 05/28/2020   K 4.6 05/28/2020   CO2 27 05/28/2020   GLUCOSE 101 (H) 05/28/2020   BUN 15 05/28/2020   CREATININE 1.21 (H) 05/28/2020   BILITOT 0.5 11/30/2019   ALKPHOS 92 12/02/2016   AST 22 11/30/2019   ALT 17 11/30/2019   PROT 6.6 11/30/2019   ALBUMIN 4.3 12/02/2016   CALCIUM 9.2 05/28/2020   Lab Results  Component Value Date   CHOL 229 (H) 11/30/2019   Lab Results  Component Value Date   HDL 61 11/30/2019   Lab Results  Component Value Date   LDLCALC 148 (H) 11/30/2019   Lab Results  Component Value Date   TRIG 94 11/30/2019   Lab Results  Component Value Date   CHOLHDL 3.8 11/30/2019   No results found for: HGBA1C    Assessment & Plan:   Problem List Items Addressed This Visit   None   Visit Diagnoses    Localized swelling of both lower extremities    -  Primary   Relevant Orders   COMPLETE METABOLIC PANEL WITH GFR   TSH   B Nat Peptide   CBC (Completed)   Urinalysis, Routine w reflex microscopic (Completed)   Urine Culture   Fatigue, unspecified type       Relevant Orders   COMPLETE METABOLIC PANEL WITH GFR   TSH   B Nat Peptide   CBC (Completed)   Urinalysis, Routine w reflex microscopic (Completed)   Urine Culture   Myalgia         Localized edema both lower extremities-we will evaluate renal function, liver function and for heart failure.  She  has been on amlodipine for about 5 years so would be very unusual for her to had to have sudden onset lower extremity swelling without additional cause.  She denies any recent increase in salt intake or activity level.  We will also check urinalysis to look for protein loss as well as urine culture will call with results once available in the meantime we will also give her a trial of furosemide for few days also limit fluid intake to 50 ounces a day.  Fatigue-additional work-up checking her thyroid and CBC for anemia etc.  Myalgias-she feels like it secondary to her statin.  Okay to cut statin in half for couple of weeks just to see if she notices improvement.  We could also try switching statins or working up myalgias further if needed.  Meds ordered this encounter  Medications  . furosemide (LASIX) 20 MG tablet    Sig: Take 1 tablet (20 mg total) by mouth daily as needed.    Dispense:  30 tablet    Refill:  0    Follow-up: Return in about 1 week (around 09/24/2020) for swelling of feet, bilaterally.    Nani Gasser, MD

## 2020-09-17 NOTE — Patient Instructions (Addendum)
Okay to cut Lipitor in half.  If still having myalgias and body aches then please let me know.  Your swelling and get a send over diuretic to take daily for the next 3 days.  Limit your fluid intake to about 50 ounces daily.  Avoid salt as much as possible.  And we will see what your labs show.  Please weigh yourself daily on your home scale starting today when you first get home so that I can keep track of how much fluid we are pulling off.  So please monitor your blood pressures daily.  When to make sure that we do not drop too low.

## 2020-09-18 LAB — CBC
HCT: 39.9 % (ref 35.0–45.0)
Hemoglobin: 13.4 g/dL (ref 11.7–15.5)
MCH: 31.8 pg (ref 27.0–33.0)
MCHC: 33.6 g/dL (ref 32.0–36.0)
MCV: 94.8 fL (ref 80.0–100.0)
MPV: 9.7 fL (ref 7.5–12.5)
Platelets: 261 10*3/uL (ref 140–400)
RBC: 4.21 10*6/uL (ref 3.80–5.10)
RDW: 12.2 % (ref 11.0–15.0)
WBC: 5.8 10*3/uL (ref 3.8–10.8)

## 2020-09-18 LAB — COMPLETE METABOLIC PANEL WITH GFR
AG Ratio: 1.9 (calc) (ref 1.0–2.5)
ALT: 12 U/L (ref 6–29)
AST: 18 U/L (ref 10–35)
Albumin: 4.1 g/dL (ref 3.6–5.1)
Alkaline phosphatase (APISO): 97 U/L (ref 37–153)
BUN/Creatinine Ratio: 13 (calc) (ref 6–22)
BUN: 14 mg/dL (ref 7–25)
CO2: 27 mmol/L (ref 20–32)
Calcium: 9.6 mg/dL (ref 8.6–10.4)
Chloride: 105 mmol/L (ref 98–110)
Creat: 1.11 mg/dL — ABNORMAL HIGH (ref 0.60–0.93)
GFR, Est African American: 55 mL/min/{1.73_m2} — ABNORMAL LOW (ref >=60)
GFR, Est Non African American: 48 mL/min/{1.73_m2} — ABNORMAL LOW (ref >=60)
Globulin: 2.2 g/dL (calc) (ref 1.9–3.7)
Glucose, Bld: 88 mg/dL (ref 65–139)
Potassium: 4.4 mmol/L (ref 3.5–5.3)
Sodium: 139 mmol/L (ref 135–146)
Total Bilirubin: 0.4 mg/dL (ref 0.2–1.2)
Total Protein: 6.3 g/dL (ref 6.1–8.1)

## 2020-09-18 LAB — URINALYSIS, ROUTINE W REFLEX MICROSCOPIC
Bilirubin Urine: NEGATIVE
Glucose, UA: NEGATIVE
Hgb urine dipstick: NEGATIVE
Ketones, ur: NEGATIVE
Leukocytes,Ua: NEGATIVE
Nitrite: NEGATIVE
Protein, ur: NEGATIVE
Specific Gravity, Urine: 1.01 (ref 1.001–1.03)
pH: 5.5 (ref 5.0–8.0)

## 2020-09-18 LAB — URINE CULTURE
MICRO NUMBER:: 11367606
Result:: NO GROWTH
SPECIMEN QUALITY:: ADEQUATE

## 2020-09-18 LAB — TSH: TSH: 2.56 mIU/L (ref 0.40–4.50)

## 2020-09-18 LAB — BRAIN NATRIURETIC PEPTIDE: Brain Natriuretic Peptide: 220 pg/mL — ABNORMAL HIGH (ref <100)

## 2020-09-18 NOTE — Addendum Note (Signed)
Addended by: Nani Gasser D on: 09/18/2020 06:39 PM   Modules accepted: Orders

## 2020-09-18 NOTE — Telephone Encounter (Signed)
Pt was seen 12/29

## 2020-09-24 ENCOUNTER — Other Ambulatory Visit: Payer: Self-pay

## 2020-09-24 ENCOUNTER — Ambulatory Visit (INDEPENDENT_AMBULATORY_CARE_PROVIDER_SITE_OTHER): Payer: Medicare Other | Admitting: Family Medicine

## 2020-09-24 ENCOUNTER — Encounter: Payer: Self-pay | Admitting: Family Medicine

## 2020-09-24 VITALS — BP 138/74 | HR 96 | Ht 60.0 in | Wt 163.9 lb

## 2020-09-24 DIAGNOSIS — M7989 Other specified soft tissue disorders: Secondary | ICD-10-CM

## 2020-09-24 DIAGNOSIS — K2100 Gastro-esophageal reflux disease with esophagitis, without bleeding: Secondary | ICD-10-CM | POA: Diagnosis not present

## 2020-09-24 DIAGNOSIS — Z79899 Other long term (current) drug therapy: Secondary | ICD-10-CM | POA: Diagnosis not present

## 2020-09-24 DIAGNOSIS — I509 Heart failure, unspecified: Secondary | ICD-10-CM

## 2020-09-24 DIAGNOSIS — H9203 Otalgia, bilateral: Secondary | ICD-10-CM

## 2020-09-24 DIAGNOSIS — I10 Essential (primary) hypertension: Secondary | ICD-10-CM | POA: Diagnosis not present

## 2020-09-24 DIAGNOSIS — I5030 Unspecified diastolic (congestive) heart failure: Secondary | ICD-10-CM | POA: Insufficient documentation

## 2020-09-24 LAB — BASIC METABOLIC PANEL WITH GFR
BUN/Creatinine Ratio: 16 (calc) (ref 6–22)
BUN: 18 mg/dL (ref 7–25)
CO2: 25 mmol/L (ref 20–32)
Calcium: 9.7 mg/dL (ref 8.6–10.4)
Chloride: 103 mmol/L (ref 98–110)
Creat: 1.15 mg/dL — ABNORMAL HIGH (ref 0.60–0.93)
GFR, Est African American: 53 mL/min/{1.73_m2} — ABNORMAL LOW (ref >=60)
GFR, Est Non African American: 46 mL/min/{1.73_m2} — ABNORMAL LOW (ref >=60)
Glucose, Bld: 91 mg/dL (ref 65–139)
Potassium: 4.3 mmol/L (ref 3.5–5.3)
Sodium: 138 mmol/L (ref 135–146)

## 2020-09-24 MED ORDER — POTASSIUM CHLORIDE ER 10 MEQ PO TBCR: 10.0000 meq | EXTENDED_RELEASE_TABLET | Freq: Every day | ORAL | 0 refills | Status: DC

## 2020-09-24 NOTE — Assessment & Plan Note (Signed)
Pressure looks fantastic today compared to previous.  We will continue furosemide 20 mg for now and half tab of amlodipine

## 2020-09-24 NOTE — Progress Notes (Signed)
Established Patient Office Visit  Subjective:  Patient ID: Nancy Wilson, female    DOB: 03-Sep-1941  Age: 80 y.o. MRN: [id number]  CC:  Chief Complaint  Patient presents with  . Follow-up    HPI Nancy Wilson presents for follow-up of lower extremity edema.  I saw her approximately 1 week ago bilateral foot swelling that had started about a week before she saw me.  We did blood work.  BNP was elevated at 220.  No sign of anemia or thyroid disorder.  No proteinuria..  We have placed a referral for echocardiogram but has not been scheduled yet.  She has been following her weights at home daily and brought a card in with those written down..  She started out at 162 pounds on her home scale and got down to about 155 pounds on Sunday but then gained back up to 160 pounds by Monday.  On our scale here in the office she is actually down about 5 pounds and she does feel like her swelling has been better.  She still taking a half a tab of the amlodipine as well as furosemide 20 mg daily.  Outside and brought in a log of home blood pressures and they all look fantastic.  She also wants me to check her ears today she feels like she has been having some pressure and discomfort.  No pain no drainage no upper respiratory symptoms.  Past Medical History:  Diagnosis Date  . Allergy   . Depression    long standing  . Hyperlipidemia   . Hypertension     Past Surgical History:  Procedure Laterality Date  . carpel tunnel release, both hands  1980's  . DILATION AND CURETTAGE OF UTERUS    . FRACTURE SURGERY     fell and broke right arm  . GASTROSCOPY  11-06  . trigger finger release, left finger    . TUBAL LIGATION     bilateral    Family History  Problem Relation Age of Onset  . Diabetes Mother   . Hyperlipidemia Mother   . Heart disease Mother 32  . Hypertension Mother   . Diabetes Father   . Hyperlipidemia Father   . Tremor Father        hand- started in his 21's  . Hypertension  Father   . Tremor Cousin     Social History   Socioeconomic History  . Marital status: Married    Spouse name: Geographical information systems officer  . Number of children: 2  . Years of education: 9th  . Highest education level: 9th grade  Occupational History  . Occupation: Housewife    Comment: retired  Tobacco Use  . Smoking status: Never Smoker  . Smokeless tobacco: Never Used  Vaping Use  . Vaping Use: Never used  Substance and Sexual Activity  . Alcohol use: No    Alcohol/week: 0.0 standard drinks  . Drug use: No  . Sexual activity: Not Currently  Other Topics Concern  . Not on file  Social History Narrative   No caffeine use. Doing physical therapy to help with her balance issue   Social Determinants of Health   Financial Resource Strain: Not on file  Food Insecurity: Not on file  Transportation Needs: Not on file  Physical Activity: Not on file  Stress: Not on file  Social Connections: Not on file  Intimate Partner Violence: Not on file    Outpatient Medications Prior to Visit  Medication Sig Dispense Refill  .  acyclovir (ZOVIRAX) 400 MG tablet TAKE 1 TABLET BY MOUTH AT  BEDTIME 90 tablet 1  . alendronate (FOSAMAX) 70 MG tablet Take 1 tablet (70 mg total) by mouth every 7 (seven) days. Take with a full glass of water on an empty stomach. 12 tablet 4  . amLODipine (NORVASC) 10 MG tablet TAKE 1 TABLET BY MOUTH  DAILY 90 tablet 3  . ASPIRIN 81 PO Take by mouth.    Marland Kitchen atorvastatin (LIPITOR) 40 MG tablet TAKE 1 TABLET BY MOUTH  DAILY 90 tablet 3  . buPROPion (WELLBUTRIN XL) 300 MG 24 hr tablet TAKE 1 TABLET BY MOUTH  DAILY 90 tablet 3  . carbidopa-levodopa (SINEMET IR) 25-100 MG tablet TAKE 1 TABLET BY MOUTH  DAILY AT BEDTIME 90 tablet 3  . Cholecalciferol (VITAMIN D3) 2000 units capsule Take 2,000 Units by mouth daily.    Marland Kitchen eletriptan (RELPAX) 20 MG tablet Take 1 tablet (20 mg total) by mouth as needed for migraine or headache. May repeat in 2 hours if headache persists or recurs. 12 tablet 4   . furosemide (LASIX) 20 MG tablet Take 1 tablet (20 mg total) by mouth daily as needed. 30 tablet 0  . oxybutynin (DITROPAN) 5 MG tablet TAKE 1 TABLET BY MOUTH  TWICE DAILY 180 tablet 3  . promethazine (PHENERGAN) 25 MG tablet Take 1 tablet (25 mg total) by mouth every 8 (eight) hours as needed for nausea or vomiting. 20 tablet 3  . propranolol (INDERAL) 40 MG tablet TAKE 1 TABLET BY MOUTH  TWICE DAILY 180 tablet 3  . sertraline (ZOLOFT) 100 MG tablet TAKE 1 TABLET BY MOUTH  DAILY 90 tablet 3   No facility-administered medications prior to visit.    Allergies  Allergen Reactions  . Hydrocodone-Acetaminophen Hives  . Codeine Other (See Comments)  . Cymbalta [Duloxetine Hcl] Other (See Comments)  . Doxycycline   . Effexor [Venlafaxine] Other (See Comments)    Tearful  . Guaifenesin   . Meperidine Other (See Comments)  . Meperidine Hcl   . Metronidazole   . Morphine Other (See Comments)  . Penicillins   . Prednisone Other (See Comments)  . Propoxyphene Nausea And Vomiting  . Prozac [Fluoxetine Hcl] Other (See Comments)  . Ropinirole Other (See Comments)    Sweaty and tachycardic    ROS Review of Systems    Objective:    Physical Exam Constitutional:      Appearance: She is well-developed and well-nourished.  HENT:     Head: Normocephalic and atraumatic.     Right Ear: Tympanic membrane, ear canal and external ear normal.     Left Ear: Tympanic membrane, ear canal and external ear normal.     Nose: Nose normal.     Mouth/Throat:     Mouth: Mucous membranes are moist.     Pharynx: Oropharynx is clear. No oropharyngeal exudate or posterior oropharyngeal erythema.  Eyes:     Conjunctiva/sclera: Conjunctivae normal.     Pupils: Pupils are equal, round, and reactive to light.  Neck:     Thyroid: No thyromegaly.  Cardiovascular:     Rate and Rhythm: Normal rate and regular rhythm.     Heart sounds: Normal heart sounds.  Pulmonary:     Effort: Pulmonary effort is  normal.     Breath sounds: Normal breath sounds. No wheezing.  Musculoskeletal:     Cervical back: Neck supple.  Lymphadenopathy:     Cervical: No cervical adenopathy.  Skin:    General:  Skin is warm and dry.  Neurological:     Mental Status: She is alert and oriented to person, place, and time.  Psychiatric:        Mood and Affect: Mood and affect normal.        Behavior: Behavior normal.     BP 138/74   Pulse 96   Ht 5' (1.524 m)   Wt 163 lb 14.4 oz (74.3 kg)   SpO2 99%   BMI 32.01 kg/m  Wt Readings from Last 3 Encounters:  09/24/20 163 lb 14.4 oz (74.3 kg)  09/17/20 168 lb (76.2 kg)  05/22/20 167 lb (75.8 kg)     Health Maintenance Due  Topic Date Due  . Hepatitis C Screening  Never done  . COVID-19 Vaccine (3 - Booster for Pfizer series) 06/26/2020    There are no preventive care reminders to display for this patient.  Lab Results  Component Value Date   TSH 2.56 09/17/2020   Lab Results  Component Value Date   WBC 5.8 09/17/2020   HGB 13.4 09/17/2020   HCT 39.9 09/17/2020   MCV 94.8 09/17/2020   PLT 261 09/17/2020   Lab Results  Component Value Date   NA 139 09/17/2020   K 4.4 09/17/2020   CO2 27 09/17/2020   GLUCOSE 88 09/17/2020   BUN 14 09/17/2020   CREATININE 1.11 (H) 09/17/2020   BILITOT 0.4 09/17/2020   ALKPHOS 92 12/02/2016   AST 18 09/17/2020   ALT 12 09/17/2020   PROT 6.3 09/17/2020   ALBUMIN 4.3 12/02/2016   CALCIUM 9.6 09/17/2020   Lab Results  Component Value Date   CHOL 229 (H) 11/30/2019   Lab Results  Component Value Date   HDL 61 11/30/2019   Lab Results  Component Value Date   LDLCALC 148 (H) 11/30/2019   Lab Results  Component Value Date   TRIG 94 11/30/2019   Lab Results  Component Value Date   CHOLHDL 3.8 11/30/2019   No results found for: HGBA1C    Assessment & Plan:   Problem List Items Addressed This Visit      Cardiovascular and Mediastinum   HYPERTENSION, BENIGN    Pressure looks fantastic  today compared to previous.  We will continue furosemide 20 mg for now and half tab of amlodipine      Acute heart failure (HCC)    Acute heart failure unclear if diastolic or systolic we are getting her scheduled for an echocardiogram for further work-up but she has responded really well to diuresis suspecting systolic heart failure.  We will continue 20 mg daily for now and check BMP to make sure that her potassium is normal we will likely need to start a potassium supplement in addition to her current regimen.  Depending on if she has systolic or diastolic heart failure then we may need to consider adding an ACE inhibitor.  Plan to follow-up in 1 month.       Other Visit Diagnoses    Localized swelling of both lower extremities    -  Primary   Relevant Orders   BASIC METABOLIC PANEL WITH GFR   Medication management       Relevant Orders   BASIC METABOLIC PANEL WITH GFR   Otalgia of both ears          Bilateral ear pain most consistent with eustachian tube dysfunction.  Discussed options.  She might benefit from a nasal steroid spray.  But ear exam is normal  and reassuring.  Meds ordered this encounter  Medications  . potassium chloride (KLOR-CON) 10 MEQ tablet    Sig: Take 1 tablet (10 mEq total) by mouth daily. With furosemide    Dispense:  90 tablet    Refill:  0    Follow-up: Return in about 4 weeks (around 10/22/2020) for follow up testing/echo.    Beatrice Lecher, MD

## 2020-09-24 NOTE — Progress Notes (Signed)
Pt brought in her weights and bp readings for the past week.   She reports that she is doing much better with the exception of needing to run to the bathroom more. The swelling in her ankles and feet are better however, she said that her L ankle is still sore.   Denies any SOB/CP, dizziness.

## 2020-09-24 NOTE — Patient Instructions (Signed)
Continue taking half a tab of amlodipine and the whole tab of furosemide daily.  Should call to schedule you for the echocardiogram.  If you have not heard from anyone by next Tuesday please give [place] call back.

## 2020-09-24 NOTE — Assessment & Plan Note (Signed)
Acute heart failure unclear if diastolic or systolic we are getting her scheduled for an echocardiogram for further work-up but she has responded really well to diuresis suspecting systolic heart failure.  We will continue 20 mg daily for now and check BMP to make sure that her potassium is normal we will likely need to start a potassium supplement in addition to her current regimen.  Depending on if she has systolic or diastolic heart failure then we may need to consider adding an ACE inhibitor.  Plan to follow-up in 1 month.

## 2020-09-25 NOTE — Progress Notes (Signed)
All labs are normal. 

## 2020-09-26 MED ORDER — OMEPRAZOLE 40 MG PO CPDR: 40.0000 mg | DELAYED_RELEASE_CAPSULE | Freq: Every day | ORAL | 3 refills | Status: DC

## 2020-09-26 NOTE — Addendum Note (Signed)
Addended by: Nani Gasser D on: 09/26/2020 07:48 AM   Modules accepted: Orders

## 2020-09-30 ENCOUNTER — Other Ambulatory Visit: Payer: Self-pay | Admitting: Family Medicine

## 2020-10-09 ENCOUNTER — Other Ambulatory Visit: Payer: Self-pay | Admitting: Family Medicine

## 2020-10-15 ENCOUNTER — Ambulatory Visit (HOSPITAL_BASED_OUTPATIENT_CLINIC_OR_DEPARTMENT_OTHER)
Admission: RE | Admit: 2020-10-15 | Discharge: 2020-10-15 | Disposition: A | Discharge status: Home / Self Care | Payer: Medicare Other | Source: Ambulatory Visit | Attending: Family Medicine | Admitting: Family Medicine

## 2020-10-15 ENCOUNTER — Other Ambulatory Visit: Payer: Self-pay

## 2020-10-15 DIAGNOSIS — M7989 Other specified soft tissue disorders: Secondary | ICD-10-CM

## 2020-10-15 DIAGNOSIS — R7989 Other specified abnormal findings of blood chemistry: Secondary | ICD-10-CM | POA: Diagnosis not present

## 2020-10-15 DIAGNOSIS — I351 Nonrheumatic aortic (valve) insufficiency: Secondary | ICD-10-CM | POA: Insufficient documentation

## 2020-10-15 DIAGNOSIS — R5383 Other fatigue: Secondary | ICD-10-CM

## 2020-10-15 DIAGNOSIS — E785 Hyperlipidemia, unspecified: Secondary | ICD-10-CM | POA: Diagnosis not present

## 2020-10-15 DIAGNOSIS — I10 Essential (primary) hypertension: Secondary | ICD-10-CM | POA: Insufficient documentation

## 2020-10-15 DIAGNOSIS — R600 Localized edema: Secondary | ICD-10-CM

## 2020-10-15 LAB — ECHOCARDIOGRAM COMPLETE
Area-P 1/2: 3.19 cm2
P 1/2 time: 503 msec
S' Lateral: 1.91 cm

## 2020-10-16 ENCOUNTER — Ambulatory Visit (INDEPENDENT_AMBULATORY_CARE_PROVIDER_SITE_OTHER): Payer: Medicare Other | Admitting: Family Medicine

## 2020-10-16 ENCOUNTER — Encounter: Payer: Self-pay | Admitting: Family Medicine

## 2020-10-16 VITALS — BP 122/60 | HR 85 | Temp 98.3°F | Ht 60.0 in | Wt 163.0 lb

## 2020-10-16 DIAGNOSIS — J40 Bronchitis, not specified as acute or chronic: Secondary | ICD-10-CM | POA: Diagnosis not present

## 2020-10-16 DIAGNOSIS — R059 Cough, unspecified: Secondary | ICD-10-CM | POA: Diagnosis not present

## 2020-10-16 DIAGNOSIS — I5032 Chronic diastolic (congestive) heart failure: Secondary | ICD-10-CM

## 2020-10-16 MED ORDER — FUROSEMIDE 20 MG PO TABS: 20.0000 mg | ORAL_TABLET | Freq: Every day | ORAL | 0 refills | Status: DC | PRN

## 2020-10-16 MED ORDER — BENZONATATE 200 MG PO CAPS: 200.0000 mg | ORAL_CAPSULE | Freq: Two times a day (BID) | ORAL | 0 refills | Status: DC | PRN

## 2020-10-16 NOTE — Patient Instructions (Addendum)

## 2020-10-16 NOTE — Progress Notes (Signed)
Established Patient Office Visit  Subjective:  Patient ID: Nancy Wilson, female    DOB: Oct 30, 1940  Age: 80 y.o. MRN: [id number]  CC:  Chief Complaint  Patient presents with  . Edema    HPI DEREK HUNEYCUTT presents for cold and cough since Friday, 7 days. No fever, sweats or chills. Coughing up green sputum.  She feels like she has a swollen gland on the right side of her neck.  No painful swallowing.  Sputum is green. No nasal congestion.    F/U LE edema. She says her swelling  Is better since taking the diuretic and she has been doing well overall she has been tolerating it without any side effects.  I do strongly suspect that she has some underlying heart failure fortunately her echocardiogram results came back showing a normal ejection fraction.  But she did have significant sudden onset lower extremity swelling as well as elevated BNP at that time.  Past Medical History:  Diagnosis Date  . Allergy   . Depression    long standing  . Hyperlipidemia   . Hypertension     Past Surgical History:  Procedure Laterality Date  . carpel tunnel release, both hands  1980's  . DILATION AND CURETTAGE OF UTERUS    . FRACTURE SURGERY     fell and broke right arm  . GASTROSCOPY  11-06  . trigger finger release, left finger    . TUBAL LIGATION     bilateral    Family History  Problem Relation Age of Onset  . Diabetes Mother   . Hyperlipidemia Mother   . Heart disease Mother 46  . Hypertension Mother   . Diabetes Father   . Hyperlipidemia Father   . Tremor Father        hand- started in his 68's  . Hypertension Father   . Tremor Cousin     Social History   Socioeconomic History  . Marital status: Married    Spouse name: Geographical information systems officer  . Number of children: 2  . Years of education: 9th  . Highest education level: 9th grade  Occupational History  . Occupation: Housewife    Comment: retired  Tobacco Use  . Smoking status: Never Smoker  . Smokeless tobacco: Never Used  Vaping  Use  . Vaping Use: Never used  Substance and Sexual Activity  . Alcohol use: No    Alcohol/week: 0.0 standard drinks  . Drug use: No  . Sexual activity: Not Currently  Other Topics Concern  . Not on file  Social History Narrative   No caffeine use. Doing physical therapy to help with her balance issue   Social Determinants of Health   Financial Resource Strain: Not on file  Food Insecurity: Not on file  Transportation Needs: Not on file  Physical Activity: Not on file  Stress: Not on file  Social Connections: Not on file  Intimate Partner Violence: Not on file    Outpatient Medications Prior to Visit  Medication Sig Dispense Refill  . acyclovir (ZOVIRAX) 400 MG tablet TAKE 1 TABLET BY MOUTH AT  BEDTIME 90 tablet 1  . alendronate (FOSAMAX) 70 MG tablet TAKE 1 TABLET BY MOUTH  WEEKLY WITH 8 OZ OF PLAIN  WATER 30 MINUTES BEFORE  FIRST FOOD, DRINK OR MEDS.  STAY UPRIGHT FOR 30 MINS 12 tablet 3  . amLODipine (NORVASC) 10 MG tablet TAKE 1 TABLET BY MOUTH  DAILY 90 tablet 3  . ASPIRIN 81 PO Take by mouth.    Marland Kitchen  atorvastatin (LIPITOR) 40 MG tablet TAKE 1 TABLET BY MOUTH  DAILY 90 tablet 3  . buPROPion (WELLBUTRIN XL) 300 MG 24 hr tablet TAKE 1 TABLET BY MOUTH  DAILY 90 tablet 3  . carbidopa-levodopa (SINEMET IR) 25-100 MG tablet TAKE 1 TABLET BY MOUTH  DAILY AT BEDTIME 90 tablet 3  . Cholecalciferol (VITAMIN D3) 2000 units capsule Take 2,000 Units by mouth daily.    Marland Kitchen eletriptan (RELPAX) 20 MG tablet Take 1 tablet (20 mg total) by mouth as needed for migraine or headache. May repeat in 2 hours if headache persists or recurs. 12 tablet 4  . omeprazole (PRILOSEC) 40 MG capsule Take 1 capsule (40 mg total) by mouth daily. 30 capsule 3  . oxybutynin (DITROPAN) 5 MG tablet TAKE 1 TABLET BY MOUTH  TWICE DAILY 180 tablet 3  . potassium chloride (KLOR-CON) 10 MEQ tablet Take 1 tablet (10 mEq total) by mouth daily. With furosemide 90 tablet 0  . promethazine (PHENERGAN) 25 MG tablet Take 1 tablet  (25 mg total) by mouth every 8 (eight) hours as needed for nausea or vomiting. 20 tablet 3  . propranolol (INDERAL) 40 MG tablet TAKE 1 TABLET BY MOUTH  TWICE DAILY 180 tablet 3  . sertraline (ZOLOFT) 100 MG tablet TAKE 1 TABLET BY MOUTH  DAILY 90 tablet 3  . furosemide (LASIX) 20 MG tablet TAKE 1 TABLET BY MOUTH EVERY DAY AS NEEDED 30 tablet 0   No facility-administered medications prior to visit.    Allergies  Allergen Reactions  . Hydrocodone-Acetaminophen Hives  . Codeine Other (See Comments)  . Cymbalta [Duloxetine Hcl] Other (See Comments)  . Doxycycline   . Effexor [Venlafaxine] Other (See Comments)    Tearful  . Guaifenesin   . Meperidine Other (See Comments)  . Meperidine Hcl   . Metronidazole   . Morphine Other (See Comments)  . Penicillins   . Prednisone Other (See Comments)  . Propoxyphene Nausea And Vomiting  . Prozac [Fluoxetine Hcl] Other (See Comments)  . Ropinirole Other (See Comments)    Sweaty and tachycardic    ROS Review of Systems    Objective:    Physical Exam Constitutional:      Appearance: She is well-developed.  HENT:     Head: Normocephalic and atraumatic.     Right Ear: Tympanic membrane, ear canal and external ear normal.     Left Ear: Tympanic membrane, ear canal and external ear normal.     Nose: Nose normal.     Mouth/Throat:     Mouth: Mucous membranes are moist.     Pharynx: Oropharynx is clear. No oropharyngeal exudate or posterior oropharyngeal erythema.  Eyes:     Conjunctiva/sclera: Conjunctivae normal.     Pupils: Pupils are equal, round, and reactive to light.  Neck:     Thyroid: No thyromegaly.  Cardiovascular:     Rate and Rhythm: Normal rate and regular rhythm.     Heart sounds: Normal heart sounds.  Pulmonary:     Effort: Pulmonary effort is normal.     Breath sounds: Normal breath sounds. No wheezing.  Musculoskeletal:     Cervical back: Neck supple.     Comments: No lower extremity edema.  Lymphadenopathy:      Cervical: No cervical adenopathy.  Skin:    General: Skin is warm and dry.  Neurological:     Mental Status: She is alert and oriented to person, place, and time.     BP 122/60   Pulse  85   Temp 98.3 F (36.8 C)   Ht 5' (1.524 m)   Wt 163 lb (73.9 kg)   SpO2 98%   BMI 31.83 kg/m  Wt Readings from Last 3 Encounters:  10/16/20 163 lb (73.9 kg)  09/24/20 163 lb 14.4 oz (74.3 kg)  09/17/20 168 lb (76.2 kg)     Health Maintenance Due  Topic Date Due  . Hepatitis C Screening  Never done  . COVID-19 Vaccine (3 - Booster for Pfizer series) 06/26/2020    There are no preventive care reminders to display for this patient.  Lab Results  Component Value Date   TSH 2.56 09/17/2020   Lab Results  Component Value Date   WBC 5.8 09/17/2020   HGB 13.4 09/17/2020   HCT 39.9 09/17/2020   MCV 94.8 09/17/2020   PLT 261 09/17/2020   Lab Results  Component Value Date   NA 138 09/24/2020   K 4.3 09/24/2020   CO2 25 09/24/2020   GLUCOSE 91 09/24/2020   BUN 18 09/24/2020   CREATININE 1.15 (H) 09/24/2020   BILITOT 0.4 09/17/2020   ALKPHOS 92 12/02/2016   AST 18 09/17/2020   ALT 12 09/17/2020   PROT 6.3 09/17/2020   ALBUMIN 4.3 12/02/2016   CALCIUM 9.7 09/24/2020   Lab Results  Component Value Date   CHOL 229 (H) 11/30/2019   Lab Results  Component Value Date   HDL 61 11/30/2019   Lab Results  Component Value Date   LDLCALC 148 (H) 11/30/2019   Lab Results  Component Value Date   TRIG 94 11/30/2019   Lab Results  Component Value Date   CHOLHDL 3.8 11/30/2019   No results found for: HGBA1C    Assessment & Plan:   Problem List Items Addressed This Visit      Cardiovascular and Mediastinum   Heart failure with preserved ejection fraction (HCC)    She looks good on exam today no sign of volume overload she is feeling better and blood pressure looks great. Just keep an eye on her potassium and her renal function on the diuretic.  Think her weight has stabilized  around 163.      Relevant Medications   furosemide (LASIX) 20 MG tablet    Other Visit Diagnoses    Cough    -  Primary   Relevant Medications   benzonatate (TESSALON) 200 MG capsule   Other Relevant Orders   Novel Coronavirus, NAA ([facility])   Bronchitis       Relevant Medications   benzonatate (TESSALON) 200 MG capsule     Acute bronchitis - likely viral.  Recommend we get her COVID tested.  She is out of the window for Tamiflu so we are not going to test for flu today.  Recommend Tessalon Perles for cough.  Make sure hydrating well.  Running humidifier.  If not improving over the weekend then please let us know and we can prescribe an antibiotic if not continuing to improve.    Meds ordered this encounter  Medications  . benzonatate (TESSALON) 200 MG capsule    Sig: Take 1 capsule (200 mg total) by mouth 2 (two) times daily as needed for cough.    Dispense:  20 capsule    Refill:  0  . furosemide (LASIX) 20 MG tablet    Sig: Take 1 tablet (20 mg total) by mouth daily as needed.    Dispense:  90 tablet    Refill:  0  Follow-up: Return if symptoms worsen or fail to improve.    Beatrice Lecher, MD

## 2020-10-16 NOTE — Assessment & Plan Note (Signed)
She looks good on exam today no sign of volume overload she is feeling better and blood pressure looks great. Just keep an eye on her potassium and her renal function on the diuretic.  Think her weight has stabilized around 163.

## 2020-10-18 LAB — SARS-COV-2, NAA 2 DAY TAT

## 2020-10-18 LAB — NOVEL CORONAVIRUS, NAA: SARS-CoV-2, NAA: NOT DETECTED

## 2020-10-21 ENCOUNTER — Telehealth: Payer: Self-pay | Admitting: Family Medicine

## 2020-10-21 NOTE — Telephone Encounter (Signed)
Pt called. She was given her COVID results.  She is feeling better.  Thank you.

## 2020-10-21 NOTE — Telephone Encounter (Signed)
OK 

## 2020-10-23 ENCOUNTER — Other Ambulatory Visit: Payer: Self-pay

## 2020-10-23 ENCOUNTER — Emergency Department
Admission: EM | Admit: 2020-10-23 | Discharge: 2020-10-23 | Disposition: A | Discharge status: Home / Self Care | Payer: Medicare Other | Source: Home / Self Care | Attending: Family Medicine | Admitting: Family Medicine

## 2020-10-23 DIAGNOSIS — J209 Acute bronchitis, unspecified: Secondary | ICD-10-CM

## 2020-10-23 MED ORDER — FLUCONAZOLE 150 MG PO TABS
150.0000 mg | ORAL_TABLET | Freq: Once | ORAL | 0 refills | Status: AC
Start: 2020-10-23 — End: 2020-10-23

## 2020-10-23 MED ORDER — AZITHROMYCIN 250 MG PO TABS
ORAL_TABLET | ORAL | 0 refills | Status: DC
Start: 2020-10-23 — End: 2020-11-07

## 2020-10-23 NOTE — ED Provider Notes (Signed)
[facility]    CSN: [id number] Arrival date & time: 10/23/20  1814      History   Chief Complaint Chief Complaint  Patient presents with  . Cough    HPI Nancy Wilson is a 80 y.o. female.   Patient developed a viral URI about 2 weeks ago, evaluated and treated by her PCP one week ago with Jerilynn Som.  Patient reports that initial symptoms resolved but she continues to have a non-productive cough.  She denies pleuritic pain and shortness of breath, but complains of onset of chills during the past several days.  The history is provided by the patient.    Past Medical History:  Diagnosis Date  . Allergy   . Depression    long standing  . Hyperlipidemia   . Hypertension     Patient Active Problem List   Diagnosis Date Noted  . Heart failure with preserved ejection fraction (HCC) 09/24/2020  . Bradykinesia 05/22/2020  . Gait instability 06/05/2019  . Sacroiliac joint pain 12/28/2018  . Lumbar facet joint pain 12/28/2018  . Encounter for long-term use of opiate analgesic 12/28/2018  . Closed compression fracture of L1 lumbar vertebra, initial encounter (HCC) 12/28/2018  . Age-related osteoporosis with current pathological fracture 12/28/2018  . Lumbar foraminal stenosis 12/28/2018  . Depression with anxiety 11/29/2018  . RLS (restless legs syndrome) 11/29/2018  . Frequent falls 09/21/2018  . OAB (overactive bladder) 05/30/2017  . Benign essential tremor 04/18/2017  . Posterior vitreous detachment of left eye 05/20/2016  . Trigger middle finger of right hand 12/01/2015  . Bilateral plantar fasciitis 08/09/2014  . Osteoarthritis of both ankles 08/09/2014  . S/P laparoscopic appendectomy 12/13/2011  . Night sweats 12/13/2011  . Herpes simplex type II infection 09/23/2011  . Knee pain 08/23/2011  . BENIGN POSITIONAL VERTIGO 04/24/2010  . Hyperlipidemia 03/28/2009  . Familial tremor 03/28/2009  . HERPES GENITALIS 04/03/2008  . Hypothyroidism  04/03/2008  . Vitamin D deficiency 04/03/2008  . HYPERTENSION, BENIGN 04/03/2008  . GERD 04/03/2008  . HIATAL HERNIA 04/03/2008  . DIVERTICULOSIS OF COLON 04/03/2008  . CKD (chronic kidney disease) stage 3, GFR 30-59 ml/min (HCC) 04/03/2008  . OSTEOARTHRITIS 04/03/2008  . OSTEOPENIA 04/03/2008  . EDEMA 04/03/2008  . Migraine headache without aura 04/03/2008    Past Surgical History:  Procedure Laterality Date  . carpel tunnel release, both hands  1980's  . DILATION AND CURETTAGE OF UTERUS    . FRACTURE SURGERY     fell and broke right arm  . GASTROSCOPY  11-06  . trigger finger release, left finger    . TUBAL LIGATION     bilateral    OB History   No obstetric history on file.      Home Medications    Prior to Admission medications   Medication Sig Start Date End Date Taking? Authorizing Provider  azithromycin (ZITHROMAX Z-PAK) 250 MG tablet Take 2 tabs today; then begin one tab once daily for 4 more days. 10/23/20  Yes Lattie Haw, MD  fluconazole (DIFLUCAN) 150 MG tablet Take 1 tablet (150 mg total) by mouth once for 1 dose. May repeat in 72 hours. 10/23/20 10/23/20 Yes Lattie Haw, MD  acyclovir (ZOVIRAX) 400 MG tablet TAKE 1 TABLET BY MOUTH AT  BEDTIME 05/12/20   Agapito Games, MD  alendronate (FOSAMAX) 70 MG tablet TAKE 1 TABLET BY MOUTH  WEEKLY WITH 8 OZ OF PLAIN  WATER 30 MINUTES BEFORE  FIRST FOOD, DRINK OR MEDS.  STAY UPRIGHT FOR 30 MINS 10/01/20   Agapito Games, MD  amLODipine (NORVASC) 10 MG tablet TAKE 1 TABLET BY MOUTH  DAILY 08/20/20   Agapito Games, MD  ASPIRIN 81 PO Take by mouth.    [provider]  atorvastatin (LIPITOR) 40 MG tablet TAKE 1 TABLET BY MOUTH  DAILY 07/07/20   Agapito Games, MD  benzonatate (TESSALON) 200 MG capsule Take 1 capsule (200 mg total) by mouth 2 (two) times daily as needed for cough. 10/16/20   Agapito Games, MD  buPROPion (WELLBUTRIN XL) 300 MG 24 hr tablet TAKE 1 TABLET BY MOUTH   DAILY 07/07/20   Agapito Games, MD  carbidopa-levodopa (SINEMET IR) 25-100 MG tablet TAKE 1 TABLET BY MOUTH  DAILY AT BEDTIME 01/03/20   Agapito Games, MD  Cholecalciferol (VITAMIN D3) 2000 units capsule Take 2,000 Units by mouth daily.    [provider]  eletriptan (RELPAX) 20 MG tablet Take 1 tablet (20 mg total) by mouth as needed for migraine or headache. May repeat in 2 hours if headache persists or recurs. 08/19/20   Agapito Games, MD  furosemide (LASIX) 20 MG tablet Take 1 tablet (20 mg total) by mouth daily as needed. 10/16/20   Agapito Games, MD  omeprazole (PRILOSEC) 40 MG capsule Take 1 capsule (40 mg total) by mouth daily. 09/26/20   Agapito Games, MD  oxybutynin (DITROPAN) 5 MG tablet TAKE 1 TABLET BY MOUTH  TWICE DAILY 09/11/20   Agapito Games, MD  potassium chloride (KLOR-CON) 10 MEQ tablet Take 1 tablet (10 mEq total) by mouth daily. With furosemide 09/24/20   Agapito Games, MD  promethazine (PHENERGAN) 25 MG tablet Take 1 tablet (25 mg total) by mouth every 8 (eight) hours as needed for nausea or vomiting. 06/05/19   Agapito Games, MD  propranolol (INDERAL) 40 MG tablet TAKE 1 TABLET BY MOUTH  TWICE DAILY 07/07/20   Agapito Games, MD  sertraline (ZOLOFT) 100 MG tablet TAKE 1 TABLET BY MOUTH  DAILY 08/06/20   Agapito Games, MD    Family History Family History  Problem Relation Age of Onset  . Diabetes Mother   . Hyperlipidemia Mother   . Heart disease Mother 27  . Hypertension Mother   . Diabetes Father   . Hyperlipidemia Father   . Tremor Father        hand- started in his 70's  . Hypertension Father   . Tremor Cousin     Social History Social History   Tobacco Use  . Smoking status: Never Smoker  . Smokeless tobacco: Never Used  Vaping Use  . Vaping Use: Never used  Substance Use Topics  . Alcohol use: No    Alcohol/week: 0.0 standard drinks  . Drug use: No     Allergies    Hydrocodone-acetaminophen, Codeine, Cymbalta [duloxetine hcl], Doxycycline, Effexor [venlafaxine], Guaifenesin, Meperidine, Meperidine hcl, Metronidazole, Morphine, Penicillins, Prednisone, Propoxyphene, Prozac [fluoxetine hcl], and Ropinirole   Review of Systems Review of Systems + sore throat + cough No pleuritic pain No wheezing No nasal congestion No post-nasal drainage No sinus pain/pressure No itchy/red eyes No earache No hemoptysis No SOB No fever, + chills No nausea No vomiting No abdominal pain No diarrhea No urinary symptoms No skin rash + fatigue No myalgias No headache   Physical Exam Triage Vital Signs ED Triage Vitals  Enc Vitals Group     BP 10/23/20 1822 (!) 155/99  Pulse Rate 10/23/20 1822 89     Resp 10/23/20 1822 16     Temp 10/23/20 1822 98.4 F (36.9 C)     Temp Source 10/23/20 1822 Oral     SpO2 10/23/20 1822 98 %     Weight --      Height --      Head Circumference --      Peak Flow --      Pain Score 10/23/20 1821 0     Pain Loc --      Pain Edu? --      Excl. in GC? --    No data found.  Updated Vital Signs BP (!) 155/99 (BP Location: Right Arm) Comment: has not taken BP meds yet today  Pulse 89   Temp 98.4 F (36.9 C) (Oral)   Resp 16   SpO2 98%   Visual Acuity Right Eye Distance:   Left Eye Distance:   Bilateral Distance:    Right Eye Near:   Left Eye Near:    Bilateral Near:     Physical Exam Nursing notes and Vital Signs reviewed. Appearance:  Patient appears stated age, and in no acute distress Eyes:  Pupils are equal, round, and reactive to light and accomodation.  Extraocular movement is intact.  Conjunctivae are not inflamed  Ears:  Canals normal.  Tympanic membranes normal.  Nose:  Mildly congested turbinates.  No sinus tenderness.  Pharynx:  Mildly erythematous uvula. Neck:  Supple.  No adenopathy.  Lungs:  Clear to auscultation.  Breath sounds are equal.  Moving air well. Heart:  Regular rate and  rhythm without murmurs, rubs, or gallops.  Abdomen:  Nontender without masses or hepatosplenomegaly.  Bowel sounds are present.  No CVA or flank tenderness.  Extremities:  No edema.  Skin:  No rash present.   UC Treatments / Results  Labs (all labs ordered are listed, but only abnormal results are displayed) Labs Reviewed - No data to display  EKG   Radiology No results found.  Procedures Procedures (including critical care time)  Medications Ordered in UC Medications - No data to display  Initial Impression / Assessment and Plan / UC Course  I have reviewed the triage vital signs and the nursing notes.  Pertinent labs & imaging results that were available during my care of the patient were reviewed by me and considered in my medical decision making (see chart for details).    Begin Z-pack. Rx for Diflucan at patient's request. Followup with Family Doctor if not improved in one week.    Final Clinical Impressions(s) / UC Diagnoses   Final diagnoses:  Acute bronchitis, unspecified organism     Discharge Instructions     Stop Zoloft while taking azithromycin. Take plain guaifenesin (1200mg  extended release tabs such as Mucinex) twice daily, with plenty of water, for cough and congestion.  Get adequate rest.   May take Delsym Cough Suppressant ("12 Hour Cough Relief") at bedtime for nighttime cough.  Try warm salt water gargles for sore throat.  Stop all antihistamines for now, and other non-prescription cough/cold preparations.      ED Prescriptions    Medication Sig Dispense Auth. Provider   azithromycin (ZITHROMAX Z-PAK) 250 MG tablet Take 2 tabs today; then begin one tab once daily for 4 more days. 6 tablet Lattie Haw, MD   fluconazole (DIFLUCAN) 150 MG tablet Take 1 tablet (150 mg total) by mouth once for 1 dose. May repeat in 72 hours. 2  tablet Lattie Haw, MD        Lattie Haw, MD 10/24/20 2139

## 2020-10-23 NOTE — ED Triage Notes (Signed)
Patient presents to Urgent Care with complaints of productive cough since 8 days ago. Patient reports she is recovering from acute bronchitis and would like something to relieve the cough.

## 2020-10-23 NOTE — Discharge Instructions (Addendum)
Stop Zoloft while taking azithromycin. Take plain guaifenesin (1200mg  extended release tabs such as Mucinex) twice daily, with plenty of water, for cough and congestion.  Get adequate rest.   May take Delsym Cough Suppressant ("12 Hour Cough Relief") at bedtime for nighttime cough.  Try warm salt water gargles for sore throat.  Stop all antihistamines for now, and other non-prescription cough/cold preparations.

## 2020-10-24 ENCOUNTER — Ambulatory Visit: Payer: Medicare Other | Admitting: Medical-Surgical

## 2020-11-07 ENCOUNTER — Other Ambulatory Visit: Payer: Self-pay | Admitting: *Deleted

## 2020-11-19 ENCOUNTER — Other Ambulatory Visit: Payer: Self-pay

## 2020-11-19 ENCOUNTER — Emergency Department
Admission: EM | Admit: 2020-11-19 | Discharge: 2020-11-19 | Disposition: A | Discharge status: Home / Self Care | Payer: Medicare Other | Source: Home / Self Care

## 2020-11-19 ENCOUNTER — Ambulatory Visit: Payer: Medicare Other | Admitting: Family Medicine

## 2020-11-19 DIAGNOSIS — J3089 Other allergic rhinitis: Secondary | ICD-10-CM

## 2020-11-19 DIAGNOSIS — R059 Cough, unspecified: Secondary | ICD-10-CM | POA: Diagnosis not present

## 2020-11-19 MED ORDER — BENZONATATE 200 MG PO CAPS
200.0000 mg | ORAL_CAPSULE | Freq: Two times a day (BID) | ORAL | 0 refills | Status: DC | PRN
Start: 2020-11-19 — End: 2020-12-03

## 2020-11-19 MED ORDER — FLUTICASONE PROPIONATE 50 MCG/ACT NA SUSP
2.0000 | Freq: Every day | NASAL | 0 refills | Status: DC
Start: 2020-11-19 — End: 2021-04-07

## 2020-11-19 NOTE — ED Provider Notes (Signed)
[facility]    CSN: [id number] Arrival date & time: 11/19/20  1317      History   Chief Complaint Chief Complaint  Patient presents with  . Cough    HPI Nancy Wilson is a 80 y.o. female.   HPI  Nancy Wilson is here for a cough.  Is been bothering her for about a week.  She is coughing up thin white sputum.  No fever or chills.  No chest pain.  No shortness of breath.  No underlying lung disease such as asthma or COPD.  I reviewed her medical record.  She was seen by her PCP for cough in January, she came here to the urgent care in February, and she is back now a third time in just over 2 months.  She is requesting another antibiotic.  She thinks the Z-Pak "was not strong enough". Patient states that she does have allergies.  She states that she does not take any allergy medicine because she was informed by Dr. Cathren HarshBeese to stop this   Past Medical History:  Diagnosis Date  . Allergy   . Depression    long standing  . Hyperlipidemia   . Hypertension     Patient Active Problem List   Diagnosis Date Noted  . Heart failure with preserved ejection fraction (HCC) 09/24/2020  . Bradykinesia 05/22/2020  . Gait instability 06/05/2019  . Sacroiliac joint pain 12/28/2018  . Lumbar facet joint pain 12/28/2018  . Encounter for long-term use of opiate analgesic 12/28/2018  . Closed compression fracture of L1 lumbar vertebra, initial encounter (HCC) 12/28/2018  . Age-related osteoporosis with current pathological fracture 12/28/2018  . Lumbar foraminal stenosis 12/28/2018  . Depression with anxiety 11/29/2018  . RLS (restless legs syndrome) 11/29/2018  . Frequent falls 09/21/2018  . OAB (overactive bladder) 05/30/2017  . Benign essential tremor 04/18/2017  . Posterior vitreous detachment of left eye 05/20/2016  . Trigger middle finger of right hand 12/01/2015  . Bilateral plantar fasciitis 08/09/2014  . Osteoarthritis of both ankles 08/09/2014  . S/P laparoscopic  appendectomy 12/13/2011  . Night sweats 12/13/2011  . Herpes simplex type II infection 09/23/2011  . Knee pain 08/23/2011  . BENIGN POSITIONAL VERTIGO 04/24/2010  . Hyperlipidemia 03/28/2009  . Familial tremor 03/28/2009  . HERPES GENITALIS 04/03/2008  . Hypothyroidism 04/03/2008  . Vitamin D deficiency 04/03/2008  . HYPERTENSION, BENIGN 04/03/2008  . GERD 04/03/2008  . HIATAL HERNIA 04/03/2008  . DIVERTICULOSIS OF COLON 04/03/2008  . CKD (chronic kidney disease) stage 3, GFR 30-59 ml/min (HCC) 04/03/2008  . OSTEOARTHRITIS 04/03/2008  . OSTEOPENIA 04/03/2008  . EDEMA 04/03/2008  . Migraine headache without aura 04/03/2008    Past Surgical History:  Procedure Laterality Date  . carpel tunnel release, both hands  1980's  . DILATION AND CURETTAGE OF UTERUS    . FRACTURE SURGERY     fell and broke right arm  . GASTROSCOPY  11-06  . trigger finger release, left finger    . TUBAL LIGATION     bilateral    OB History   No obstetric history on file.      Home Medications    Prior to Admission medications   Medication Sig Start Date End Date Taking? Authorizing Provider  benzonatate (TESSALON) 200 MG capsule Take 1 capsule (200 mg total) by mouth 2 (two) times daily as needed for cough. 11/19/20  Yes Eustace MooreNelson, Alando Colleran Sue, MD  fluticasone [facility](FLONASE) 50 MCG/ACT nasal spray Place 2 sprays into both  nostrils daily. 11/19/20  Yes Eustace Moore, MD  acyclovir (ZOVIRAX) 400 MG tablet TAKE 1 TABLET BY MOUTH AT  BEDTIME 05/12/20   Agapito Games, MD  alendronate (FOSAMAX) 70 MG tablet TAKE 1 TABLET BY MOUTH  WEEKLY WITH 8 OZ OF PLAIN  WATER 30 MINUTES BEFORE  FIRST FOOD, DRINK OR MEDS.  STAY UPRIGHT FOR 30 MINS 10/01/20   Agapito Games, MD  amLODipine (NORVASC) 10 MG tablet TAKE 1 TABLET BY MOUTH  DAILY 08/20/20   Agapito Games, MD  ASPIRIN 81 PO Take by mouth.    [provider]  atorvastatin (LIPITOR) 40 MG tablet TAKE 1 TABLET BY MOUTH  DAILY 07/07/20    Agapito Games, MD  buPROPion (WELLBUTRIN XL) 300 MG 24 hr tablet TAKE 1 TABLET BY MOUTH  DAILY 07/07/20   Agapito Games, MD  carbidopa-levodopa (SINEMET IR) 25-100 MG tablet TAKE 1 TABLET BY MOUTH  DAILY AT BEDTIME 01/03/20   Agapito Games, MD  Cholecalciferol (VITAMIN D3) 2000 units capsule Take 2,000 Units by mouth daily.    [provider]  eletriptan (RELPAX) 20 MG tablet Take 1 tablet (20 mg total) by mouth as needed for migraine or headache. May repeat in 2 hours if headache persists or recurs. 08/19/20   Agapito Games, MD  furosemide (LASIX) 20 MG tablet Take 1 tablet (20 mg total) by mouth daily as needed. 10/16/20   Agapito Games, MD  omeprazole (PRILOSEC) 40 MG capsule Take 1 capsule (40 mg total) by mouth daily. 09/26/20   Agapito Games, MD  oxybutynin (DITROPAN) 5 MG tablet TAKE 1 TABLET BY MOUTH  TWICE DAILY 09/11/20   Agapito Games, MD  potassium chloride (KLOR-CON) 10 MEQ tablet Take 1 tablet (10 mEq total) by mouth daily. With furosemide 09/24/20   Agapito Games, MD  promethazine (PHENERGAN) 25 MG tablet Take 1 tablet (25 mg total) by mouth every 8 (eight) hours as needed for nausea or vomiting. 06/05/19   Agapito Games, MD  propranolol (INDERAL) 40 MG tablet TAKE 1 TABLET BY MOUTH  TWICE DAILY 07/07/20   Agapito Games, MD  sertraline (ZOLOFT) 100 MG tablet TAKE 1 TABLET BY MOUTH  DAILY 08/06/20   Agapito Games, MD    Family History Family History  Problem Relation Age of Onset  . Diabetes Mother   . Hyperlipidemia Mother   . Heart disease Mother 65  . Hypertension Mother   . Diabetes Father   . Hyperlipidemia Father   . Tremor Father        hand- started in his 62's  . Hypertension Father   . Tremor Cousin     Social History Social History   Tobacco Use  . Smoking status: Never Smoker  . Smokeless tobacco: Never Used  Vaping Use  . Vaping Use: Never used  Substance Use  Topics  . Alcohol use: No    Alcohol/week: 0.0 standard drinks  . Drug use: No     Allergies   Hydrocodone-acetaminophen, Codeine, Cymbalta [duloxetine hcl], Doxycycline, Effexor [venlafaxine], Guaifenesin, Meperidine, Meperidine hcl, Metronidazole, Morphine, Penicillins, Prednisone, Propoxyphene, Prozac [fluoxetine hcl], and Ropinirole   Review of Systems Review of Systems See HPI  Physical Exam Triage Vital Signs ED Triage Vitals  Enc Vitals Group     BP 11/19/20 1329 (!) 146/89     Pulse Rate 11/19/20 1329 83     Resp 11/19/20 1329 15     Temp 11/19/20  1329 98.3 F (36.8 C)     Temp Source 11/19/20 1329 Oral     SpO2 11/19/20 1329 97 %     Weight --      Height --      Head Circumference --      Peak Flow --      Pain Score 11/19/20 1328 0     Pain Loc --      Pain Edu? --      Excl. in GC? --    No data found.  Updated Vital Signs BP (!) 146/89 (BP Location: Right Arm)   Pulse 83   Temp 98.3 F (36.8 C) (Oral)   Resp 15   SpO2 97%      Physical Exam Constitutional:      General: She is not in acute distress.    Appearance: She is well-developed and well-nourished.     Comments: Decreased facial expression  HENT:     Head: Normocephalic and atraumatic.     Right Ear: Tympanic membrane, ear canal and external ear normal.     Left Ear: Tympanic membrane, ear canal and external ear normal.     Nose: Congestion present.     Comments: Scant clear rhinorrhea    Mouth/Throat:     Mouth: Oropharynx is clear and moist. Mucous membranes are moist.     Pharynx: No posterior oropharyngeal erythema.  Eyes:     Conjunctiva/sclera: Conjunctivae normal.     Pupils: Pupils are equal, round, and reactive to light.  Cardiovascular:     Rate and Rhythm: Normal rate and regular rhythm.     Heart sounds: Normal heart sounds.  Pulmonary:     Effort: Pulmonary effort is normal. No respiratory distress.     Breath sounds: Normal breath sounds. No wheezing or rales.   Abdominal:     Palpations: Abdomen is soft.  Musculoskeletal:        General: No edema. Normal range of motion.     Cervical back: Normal range of motion.  Skin:    General: Skin is warm and dry.  Neurological:     Mental Status: She is alert.      UC Treatments / Results  Labs (all labs ordered are listed, but only abnormal results are displayed) Labs Reviewed - No data to display  EKG   Radiology No results found.  Procedures Procedures (including critical care time)  Medications Ordered in UC Medications - No data to display  Initial Impression / Assessment and Plan / UC Course  I have reviewed the triage vital signs and the nursing notes.  Pertinent labs & imaging results that were available during my care of the patient were reviewed by me and considered in my medical decision making (see chart for details).     I considered giving the patient a few days of moderate strength of prednisone to help with allergy symptoms, however, she lists prednisone as one of her multiple allergies.  She is unable to tell me what happens when she takes prednisone.  I did check her medical record and she has used Flonase in the past.  I believe it is safe for her to take Flonase with her perceived prednisone intolerance.  I do not believe an antibiotic will help her at this point.  Follow-up with her PCP Final Clinical Impressions(s) / UC Diagnoses   Final diagnoses:  Cough  Environmental and seasonal allergies     Discharge Instructions     Drink  lots of water Take Tessalon 2 times a day to help with cough May take Mucinex DM in addition if needed Use Flonase daily to stop postnasal drip, this will also help with the coughing See your doctor in follow-up   ED Prescriptions    Medication Sig Dispense Auth. Provider   fluticasone (FLONASE) 50 MCG/ACT nasal spray Place 2 sprays into both nostrils daily. 16 g Eustace Moore, MD   benzonatate (TESSALON) 200 MG capsule  Take 1 capsule (200 mg total) by mouth 2 (two) times daily as needed for cough. 20 capsule Eustace Moore, MD     PDMP not reviewed this encounter.   Eustace Moore, MD 11/19/20 [phone]

## 2020-11-19 NOTE — Discharge Instructions (Addendum)
Drink lots of water Take Tessalon 2 times a day to help with cough May take Mucinex DM in addition if needed Use Flonase daily to stop postnasal drip, this will also help with the coughing See your doctor in follow-up

## 2020-11-19 NOTE — ED Triage Notes (Signed)
Patient presents to Urgent Care with complaints of cough since about a week ago. Patient reports she was seen for bronchitis last month and the cough finally went away, then returned last week. Cough is productive, white in color.

## 2020-11-24 ENCOUNTER — Ambulatory Visit: Payer: Medicare Other | Admitting: Family Medicine

## 2020-11-26 ENCOUNTER — Other Ambulatory Visit: Payer: Self-pay | Admitting: Family Medicine

## 2020-12-03 ENCOUNTER — Encounter: Payer: Self-pay | Admitting: Family Medicine

## 2020-12-03 ENCOUNTER — Other Ambulatory Visit: Payer: Self-pay

## 2020-12-03 ENCOUNTER — Ambulatory Visit (INDEPENDENT_AMBULATORY_CARE_PROVIDER_SITE_OTHER): Payer: Medicare Other | Admitting: Family Medicine

## 2020-12-03 VITALS — BP 169/90 | HR 108 | Temp 98.0°F | Resp 20 | Ht 60.0 in | Wt 163.0 lb

## 2020-12-03 DIAGNOSIS — R258 Other abnormal involuntary movements: Secondary | ICD-10-CM

## 2020-12-03 DIAGNOSIS — R590 Localized enlarged lymph nodes: Secondary | ICD-10-CM

## 2020-12-03 DIAGNOSIS — F418 Other specified anxiety disorders: Secondary | ICD-10-CM

## 2020-12-03 DIAGNOSIS — R296 Repeated falls: Secondary | ICD-10-CM | POA: Diagnosis not present

## 2020-12-03 DIAGNOSIS — R29898 Other symptoms and signs involving the musculoskeletal system: Secondary | ICD-10-CM

## 2020-12-03 DIAGNOSIS — R251 Tremor, unspecified: Secondary | ICD-10-CM

## 2020-12-03 DIAGNOSIS — I10 Essential (primary) hypertension: Secondary | ICD-10-CM

## 2020-12-03 NOTE — Assessment & Plan Note (Signed)
Stable on the Zoloft she does feel like it is been helpful so we will continue with current regimen.

## 2020-12-03 NOTE — Progress Notes (Signed)
Established Patient Office Visit  Subjective:  Patient ID: Nancy Wilson, female    DOB: 08-Jan-1941  Age: 80 y.o. MRN: [id number]  CC:  Chief Complaint  Patient presents with  . Anxiety    HPI Nancy Wilson presents for Mood. She is currently on zoloft for depression and anxiety. She is doing well overall.  She says her brother from [facility] came to visit and that was good for her.    She c/o of weakness in her left leg. She feels like it contributes to her falling.  She feels she can walk without a problem though. She denies any giving out or locking or pain in her leg. She says her "back doc" doesn't know why she is weak in that leg.    She says she also quit the carbidopa levodopa for her tremor because she like it was causing some weight gain she says she had gained about 15 pounds and after discussing with her husband decided to stop it.  She has been off of it for few weeks now.  She had previously been evaluated by Dr. Marisue Humble over at [facility] and he had started her on Sinemet 3 times a day.  He had noticed some bradykinesia and was working her up for possible Parkinson's.  Looks like the last time she was there was in October.  She finally got over the bronchitis she says it lasted almost a month before it finally improved.  Past Medical History:  Diagnosis Date  . Allergy   . Depression    long standing  . Hyperlipidemia   . Hypertension     Past Surgical History:  Procedure Laterality Date  . carpel tunnel release, both hands  1980's  . DILATION AND CURETTAGE OF UTERUS    . FRACTURE SURGERY     fell and broke right arm  . GASTROSCOPY  11-06  . trigger finger release, left finger    . TUBAL LIGATION     bilateral    Family History  Problem Relation Age of Onset  . Diabetes Mother   . Hyperlipidemia Mother   . Heart disease Mother 55  . Hypertension Mother   . Diabetes Father   . Hyperlipidemia Father   . Tremor Father        hand- started in his 28's  .  Hypertension Father   . Tremor Cousin     Social History   Socioeconomic History  . Marital status: Married    Spouse name: Geographical information systems officer  . Number of children: 2  . Years of education: 9th  . Highest education level: 9th grade  Occupational History  . Occupation: Housewife    Comment: retired  Tobacco Use  . Smoking status: Never Smoker  . Smokeless tobacco: Never Used  Vaping Use  . Vaping Use: Never used  Substance and Sexual Activity  . Alcohol use: No    Alcohol/week: 0.0 standard drinks  . Drug use: No  . Sexual activity: Not Currently  Other Topics Concern  . Not on file  Social History Narrative   No caffeine use. Doing physical therapy to help with her balance issue   Social Determinants of Health   Financial Resource Strain: Not on file  Food Insecurity: Not on file  Transportation Needs: Not on file  Physical Activity: Not on file  Stress: Not on file  Social Connections: Not on file  Intimate Partner Violence: Not on file    Outpatient Medications Prior to Visit  Medication Sig Dispense Refill  . acyclovir (ZOVIRAX) 400 MG tablet TAKE 1 TABLET BY MOUTH AT  BEDTIME 90 tablet 1  . alendronate (FOSAMAX) 70 MG tablet TAKE 1 TABLET BY MOUTH  WEEKLY WITH 8 OZ OF PLAIN  WATER 30 MINUTES BEFORE  FIRST FOOD, DRINK OR MEDS.  STAY UPRIGHT FOR 30 MINS 12 tablet 3  . amLODipine (NORVASC) 10 MG tablet TAKE 1 TABLET BY MOUTH  DAILY 90 tablet 3  . ASPIRIN 81 PO Take by mouth.    Marland Kitchen. atorvastatin (LIPITOR) 40 MG tablet TAKE 1 TABLET BY MOUTH  DAILY 90 tablet 3  . buPROPion (WELLBUTRIN XL) 300 MG 24 hr tablet TAKE 1 TABLET BY MOUTH  DAILY 90 tablet 3  . carbidopa-levodopa (SINEMET IR) 25-100 MG tablet TAKE 1 TABLET BY MOUTH  DAILY AT BEDTIME 90 tablet 3  . Cholecalciferol (VITAMIN D3) 2000 units capsule Take 2,000 Units by mouth daily.    Marland Kitchen. eletriptan (RELPAX) 20 MG tablet Take 1 tablet (20 mg total) by mouth as needed for migraine or headache. May repeat in 2 hours if headache  persists or recurs. 12 tablet 4  . fluticasone (FLONASE) 50 MCG/ACT nasal spray Place 2 sprays into both nostrils daily. 16 g 0  . furosemide (LASIX) 20 MG tablet Take 1 tablet (20 mg total) by mouth daily as needed. 90 tablet 0  . omeprazole (PRILOSEC) 40 MG capsule Take 1 capsule (40 mg total) by mouth daily. 30 capsule 3  . oxybutynin (DITROPAN) 5 MG tablet TAKE 1 TABLET BY MOUTH  TWICE DAILY 180 tablet 3  . potassium chloride (KLOR-CON) 10 MEQ tablet Take 1 tablet (10 mEq total) by mouth daily. With furosemide 90 tablet 0  . promethazine (PHENERGAN) 25 MG tablet Take 1 tablet (25 mg total) by mouth every 8 (eight) hours as needed for nausea or vomiting. 20 tablet 3  . propranolol (INDERAL) 40 MG tablet TAKE 1 TABLET BY MOUTH  TWICE DAILY 180 tablet 3  . sertraline (ZOLOFT) 100 MG tablet TAKE 1 TABLET BY MOUTH  DAILY 90 tablet 3  . benzonatate (TESSALON) 200 MG capsule Take 1 capsule (200 mg total) by mouth 2 (two) times daily as needed for cough. 20 capsule 0   No facility-administered medications prior to visit.    Allergies  Allergen Reactions  . Hydrocodone-Acetaminophen Hives  . Codeine Other (See Comments)  . Cymbalta [Duloxetine Hcl] Other (See Comments)  . Doxycycline   . Effexor [Venlafaxine] Other (See Comments)    Tearful  . Guaifenesin   . Meperidine Other (See Comments)  . Meperidine Hcl   . Metronidazole   . Morphine Other (See Comments)  . Penicillins   . Prednisone Other (See Comments)  . Propoxyphene Nausea And Vomiting  . Prozac [Fluoxetine Hcl] Other (See Comments)  . Ropinirole Other (See Comments)    Sweaty and tachycardic    ROS Review of Systems    Objective:    Physical Exam Constitutional:      Appearance: She is well-developed.  HENT:     Head: Normocephalic and atraumatic.  Neck:     Comments: Does have a little palpable fullness on her upper neck just at the base of the jaw.  Question salivary gland versus lymph node. Cardiovascular:      Rate and Rhythm: Normal rate and regular rhythm.     Heart sounds: Normal heart sounds.  Pulmonary:     Effort: Pulmonary effort is normal.     Breath sounds: Normal  breath sounds.  Musculoskeletal:     Cervical back: Neck supple.     Comments: Left hip, knee and ankle with 5/5 strength.    Skin:    General: Skin is warm and dry.  Neurological:     Mental Status: She is alert and oriented to person, place, and time.  Psychiatric:        Behavior: Behavior normal.     BP (!) 169/90 (BP Location: Left Arm, Patient Position: Sitting, Cuff Size: Normal)   Pulse (!) 108   Temp 98 F (36.7 C) (Oral)   Resp 20   Ht 5' (1.524 m)   Wt 163 lb (73.9 kg)   SpO2 98%   BMI 31.83 kg/m  Wt Readings from Last 3 Encounters:  12/03/20 163 lb (73.9 kg)  10/16/20 163 lb (73.9 kg)  09/24/20 163 lb 14.4 oz (74.3 kg)     Health Maintenance Due  Topic Date Due  . Hepatitis C Screening  Never done    There are no preventive care reminders to display for this patient.  Lab Results  Component Value Date   TSH 2.56 09/17/2020   Lab Results  Component Value Date   WBC 5.8 09/17/2020   HGB 13.4 09/17/2020   HCT 39.9 09/17/2020   MCV 94.8 09/17/2020   PLT 261 09/17/2020   Lab Results  Component Value Date   NA 138 09/24/2020   K 4.3 09/24/2020   CO2 25 09/24/2020   GLUCOSE 91 09/24/2020   BUN 18 09/24/2020   CREATININE 1.15 (H) 09/24/2020   BILITOT 0.4 09/17/2020   ALKPHOS 92 12/02/2016   AST 18 09/17/2020   ALT 12 09/17/2020   PROT 6.3 09/17/2020   ALBUMIN 4.3 12/02/2016   CALCIUM 9.7 09/24/2020   Lab Results  Component Value Date   CHOL 229 (H) 11/30/2019   Lab Results  Component Value Date   HDL 61 11/30/2019   Lab Results  Component Value Date   LDLCALC 148 (H) 11/30/2019   Lab Results  Component Value Date   TRIG 94 11/30/2019   Lab Results  Component Value Date   CHOLHDL 3.8 11/30/2019   No results found for: HGBA1C    Assessment & Plan:   Problem  List Items Addressed This Visit      Cardiovascular and Mediastinum   HYPERTENSION, BENIGN    Pressure is still quite elevated today normally it looks better than this.  But she also did not take her medication this morning so just encouraged her to make sure that she gets back on it regularly.        Other   Frequent falls    I am not sure how much her left leg is contributing or not on exam she does not feel appreciably more weak on that left side compared to her right.  I am happy to refer her to neurology again she was last seen about 6 months ago over at [facility].  She would like to be seen here in [place] if at all possible so we will refer her to [facility] neurology.      Relevant Orders   Ambulatory referral to Neurology   Depression with anxiety - Primary    Stable on the Zoloft she does feel like it is been helpful so we will continue with current regimen.      Bradykinesia    Really do not think that the carbidopa levodopa was causing her weight gain even in doing  a little research it did not come up as a potential side effect.  Tried to reassure her.  Certainly it is up to her if she wants to restart it between now and then.  She does not feel like it is helping with some of her frequent falls.  Though the [facility] note from October noted that she was improving in regards to falls.       Other Visit Diagnoses    Tremor       Relevant Orders   Ambulatory referral to Neurology   Left leg weakness       Relevant Orders   Ambulatory referral to Neurology   Cervical lymphadenopathy       Relevant Orders   US Soft Tissue Head/Neck (NON-THYROID)     Cervical lump-question whether is just a salivary gland or possible lymph node she says it has been coming and going and a came again about 2 days ago and has felt sore to touch.  We will start with an ultrasound for further work-up.  Left leg weakness-again unclear etiology I do not appreciate any true significant  weakness on that side but she feels like it is contributing to her falls.  Again place referral to neurology.  Also could consider orthopedic work-up.  She does have an orthopedist already.  No orders of the defined types were placed in this encounter.   Follow-up: Return in about 4 months (around 04/04/2021) for Hypertension.    Nani Gasser, MD

## 2020-12-03 NOTE — Assessment & Plan Note (Signed)
I am not sure how much her left leg is contributing or not on exam she does not feel appreciably more weak on that left side compared to her right.  I am happy to refer her to neurology again she was last seen about 6 months ago over at [facility].  She would like to be seen here in [place] if at all possible so we will refer her to [facility] neurology.

## 2020-12-03 NOTE — Assessment & Plan Note (Signed)
Really do not think that the carbidopa levodopa was causing her weight gain even in doing a little research it did not come up as a potential side effect.  Tried to reassure her.  Certainly it is up to her if she wants to restart it between now and then.  She does not feel like it is helping with some of her frequent falls.  Though the [facility] note from October noted that she was improving in regards to falls.

## 2020-12-03 NOTE — Assessment & Plan Note (Signed)
Pressure is still quite elevated today normally it looks better than this.  But she also did not take her medication this morning so just encouraged her to make sure that she gets back on it regularly.

## 2020-12-17 ENCOUNTER — Ambulatory Visit: Payer: Medicare Other

## 2020-12-25 ENCOUNTER — Telehealth: Payer: Self-pay | Admitting: *Deleted

## 2020-12-25 DIAGNOSIS — R296 Repeated falls: Secondary | ICD-10-CM

## 2020-12-25 DIAGNOSIS — G250 Essential tremor: Secondary | ICD-10-CM

## 2020-12-30 NOTE — Telephone Encounter (Signed)
Pt returned call and said she wanted to be seen by Dr. Felix Pacini at [facility].  Referral placed.

## 2021-02-09 ENCOUNTER — Telehealth: Payer: Self-pay | Admitting: Family Medicine

## 2021-02-09 NOTE — Chronic Care Management (AMB) (Signed)
  Chronic Care Management   Outreach Note  02/09/2021 Name: ADALENE GULOTTA MRN: [id number] DOB: [birth year]  Referred by: Agapito Games, MD Reason for referral : No chief complaint on file.   An unsuccessful telephone outreach was attempted today. The patient was referred to the pharmacist for assistance with care management and care coordination.   Follow Up Plan:   Carmell [place] Upstream Scheduler

## 2021-02-11 ENCOUNTER — Encounter: Payer: Self-pay | Admitting: Emergency Medicine

## 2021-02-11 ENCOUNTER — Emergency Department
Admission: EM | Admit: 2021-02-11 | Discharge: 2021-02-11 | Disposition: A | Discharge status: Home / Self Care | Payer: Medicare Other | Source: Home / Self Care | Attending: Internal Medicine | Admitting: Internal Medicine

## 2021-02-11 DIAGNOSIS — T148XXA Other injury of unspecified body region, initial encounter: Secondary | ICD-10-CM | POA: Diagnosis not present

## 2021-02-11 NOTE — Discharge Instructions (Signed)
Gentle range of motion exercises Tylenol as needed for pain Heating pad he Return to urgent care if symptoms are worse No indication for x-rays or imaging at this time.

## 2021-02-11 NOTE — ED Triage Notes (Signed)
History of falls - unknown reason per pt  Has an appt w/ a neurologist  Pt fell in the bathroom and hit her left lower back - pain to left hip  Bruising to left wrist is from fall in living room onto hardwood floor  on Sunday - ROM intact  Pt has a cane & walker  Falls started 2 years ago  Bruising noted to left wrist & left lower back

## 2021-02-13 NOTE — ED Provider Notes (Signed)
[facility]    CSN: [id number] Arrival date & time: 02/11/21  1546      History   Chief Complaint Chief Complaint  Patient presents with  . Fall  . Back Pain    HPI KARISS LONGMIRE is a 80 y.o. female comes to the urgent care with left lower back pain.  Patient has a history of recurrent falls and is scheduled to see a neurologist soon.  She comes to the urgent care complaining of a bruise over the left lower back area.  Patient apparently sustained a fall in the bathroom 3 to 4 days ago.  She denies any hip pain she did not injure.  She is able to bear weight on the hip.  She denies any abdominal pain.  No nausea or vomiting.  She does not take any blood thinners.  No dysuria urgency or frequency.  No hematuria.  Patient denies hitting her head or having any loss of consciousness.   HPI  Past Medical History:  Diagnosis Date  . Allergy   . Depression    long standing  . Hyperlipidemia   . Hypertension     Patient Active Problem List   Diagnosis Date Noted  . Heart failure with preserved ejection fraction (HCC) 09/24/2020  . Bradykinesia 05/22/2020  . Gait instability 06/05/2019  . Sacroiliac joint pain 12/28/2018  . Lumbar facet joint pain 12/28/2018  . Encounter for long-term use of opiate analgesic 12/28/2018  . Closed compression fracture of L1 lumbar vertebra, initial encounter (HCC) 12/28/2018  . Age-related osteoporosis with current pathological fracture 12/28/2018  . Lumbar foraminal stenosis 12/28/2018  . Depression with anxiety 11/29/2018  . RLS (restless legs syndrome) 11/29/2018  . Frequent falls 09/21/2018  . OAB (overactive bladder) 05/30/2017  . Benign essential tremor 04/18/2017  . Posterior vitreous detachment of left eye 05/20/2016  . Trigger middle finger of right hand 12/01/2015  . Bilateral plantar fasciitis 08/09/2014  . Osteoarthritis of both ankles 08/09/2014  . S/P laparoscopic appendectomy 12/13/2011  . Night sweats 12/13/2011   . Herpes simplex type II infection 09/23/2011  . Knee pain 08/23/2011  . BENIGN POSITIONAL VERTIGO 04/24/2010  . Hyperlipidemia 03/28/2009  . Familial tremor 03/28/2009  . HERPES GENITALIS 04/03/2008  . Hypothyroidism 04/03/2008  . Vitamin D deficiency 04/03/2008  . HYPERTENSION, BENIGN 04/03/2008  . GERD 04/03/2008  . HIATAL HERNIA 04/03/2008  . DIVERTICULOSIS OF COLON 04/03/2008  . CKD (chronic kidney disease) stage 3, GFR 30-59 ml/min (HCC) 04/03/2008  . OSTEOARTHRITIS 04/03/2008  . OSTEOPENIA 04/03/2008  . EDEMA 04/03/2008  . Migraine headache without aura 04/03/2008    Past Surgical History:  Procedure Laterality Date  . carpel tunnel release, both hands  1980's  . DILATION AND CURETTAGE OF UTERUS    . FRACTURE SURGERY     fell and broke right arm  . GASTROSCOPY  11-06  . trigger finger release, left finger    . TUBAL LIGATION     bilateral    OB History   No obstetric history on file.      Home Medications    Prior to Admission medications   Medication Sig Start Date End Date Taking? Authorizing Provider  buPROPion (WELLBUTRIN XL) 300 MG 24 hr tablet TAKE 1 TABLET BY MOUTH  DAILY 07/07/20  Yes Agapito Games, MD  carbidopa-levodopa (SINEMET IR) 25-100 MG tablet TAKE 1 TABLET BY MOUTH  DAILY AT BEDTIME 01/03/20  Yes Agapito Games, MD  Cholecalciferol (VITAMIN D3) 2000 units  capsule Take 2,000 Units by mouth daily.   Yes [provider]  furosemide (LASIX) 20 MG tablet Take 1 tablet (20 mg total) by mouth daily as needed. 10/16/20  Yes Agapito Games, MD  propranolol (INDERAL) 40 MG tablet TAKE 1 TABLET BY MOUTH  TWICE DAILY 07/07/20  Yes Agapito Games, MD  sertraline (ZOLOFT) 100 MG tablet TAKE 1 TABLET BY MOUTH  DAILY 08/06/20  Yes Agapito Games, MD  acyclovir (ZOVIRAX) 400 MG tablet TAKE 1 TABLET BY MOUTH AT  BEDTIME 11/27/20   Agapito Games, MD  alendronate (FOSAMAX) 70 MG tablet TAKE 1 TABLET BY MOUTH   WEEKLY WITH 8 OZ OF PLAIN  WATER 30 MINUTES BEFORE  FIRST FOOD, DRINK OR MEDS.  STAY UPRIGHT FOR 30 MINS 10/01/20   Agapito Games, MD  amLODipine (NORVASC) 10 MG tablet TAKE 1 TABLET BY MOUTH  DAILY 08/20/20   Agapito Games, MD  ASPIRIN 81 PO Take by mouth.    [provider]  atorvastatin (LIPITOR) 40 MG tablet TAKE 1 TABLET BY MOUTH  DAILY Patient not taking: Reported on 02/11/2021 07/07/20   Agapito Games, MD  eletriptan (RELPAX) 20 MG tablet Take 1 tablet (20 mg total) by mouth as needed for migraine or headache. May repeat in 2 hours if headache persists or recurs. 08/19/20   Agapito Games, MD  fluticasone (FLONASE) 50 MCG/ACT nasal spray Place 2 sprays into both nostrils daily. 11/19/20   Eustace Moore, MD  omeprazole (PRILOSEC) 40 MG capsule Take 1 capsule (40 mg total) by mouth daily. Patient not taking: Reported on 02/11/2021 09/26/20   Agapito Games, MD  oxybutynin (DITROPAN) 5 MG tablet TAKE 1 TABLET BY MOUTH  TWICE DAILY Patient not taking: Reported on 02/11/2021 09/11/20   Agapito Games, MD  potassium chloride (KLOR-CON) 10 MEQ tablet Take 1 tablet (10 mEq total) by mouth daily. With furosemide Patient not taking: Reported on 02/11/2021 09/24/20   Agapito Games, MD  promethazine (PHENERGAN) 25 MG tablet Take 1 tablet (25 mg total) by mouth every 8 (eight) hours as needed for nausea or vomiting. Patient not taking: Reported on 02/11/2021 06/05/19   Agapito Games, MD    Family History Family History  Problem Relation Age of Onset  . Diabetes Mother   . Hyperlipidemia Mother   . Heart disease Mother 60  . Hypertension Mother   . Diabetes Father   . Hyperlipidemia Father   . Tremor Father        hand- started in his 54's  . Hypertension Father   . Tremor Cousin     Social History Social History   Tobacco Use  . Smoking status: Never Smoker  . Smokeless tobacco: Never Used  Vaping Use  . Vaping Use: Never  used  Substance Use Topics  . Alcohol use: No    Alcohol/week: 0.0 standard drinks  . Drug use: No     Allergies   Hydrocodone-acetaminophen, Codeine, Cymbalta [duloxetine hcl], Doxycycline, Effexor [venlafaxine], Guaifenesin, Meperidine, Meperidine hcl, Metronidazole, Morphine, Penicillins, Prednisone, Propoxyphene, Prozac [fluoxetine hcl], and Ropinirole   Review of Systems Review of Systems  Respiratory: Negative.   Gastrointestinal: Negative for abdominal pain.  Musculoskeletal: Positive for arthralgias and back pain. Negative for gait problem, joint swelling and myalgias.  Skin: Negative for color change, pallor and rash.  Neurological: Negative for dizziness and headaches.     Physical Exam Triage Vital Signs ED Triage Vitals  Enc Vitals  Group     BP 02/11/21 1707 (!) 156/88     Pulse Rate 02/11/21 1707 77     Resp 02/11/21 1707 16     Temp 02/11/21 1707 99 F (37.2 C)     Temp Source 02/11/21 1707 Oral     SpO2 02/11/21 1707 100 %     Weight 02/11/21 1711 159 lb (72.1 kg)     Height 02/11/21 1711 4\' 11"  (1.499 m)     Head Circumference --      Peak Flow --      Pain Score 02/11/21 1710 5     Pain Loc --      Pain Edu? --      Excl. in GC? --    No data found.  Updated Vital Signs BP (!) 156/88 (BP Location: Right Arm)   Pulse 77   Temp 99 F (37.2 C) (Oral)   Resp 16   Ht 4\' 11"  (1.499 m)   Wt 72.1 kg   SpO2 100%   BMI 32.11 kg/m   Visual Acuity Right Eye Distance:   Left Eye Distance:   Bilateral Distance:    Right Eye Near:   Left Eye Near:    Bilateral Near:     Physical Exam Vitals and nursing note reviewed.  Constitutional:      Appearance: She is not ill-appearing.  Cardiovascular:     Rate and Rhythm: Normal rate and regular rhythm.     Pulses: Normal pulses.     Heart sounds: Normal heart sounds.  Pulmonary:     Effort: Pulmonary effort is normal.     Breath sounds: Normal breath sounds.  Abdominal:     General: Bowel sounds  are normal.     Palpations: Abdomen is soft. There is no mass.     Tenderness: There is no abdominal tenderness. There is no right CVA tenderness, left CVA tenderness, guarding or rebound.  Musculoskeletal:        General: Normal range of motion.  Skin:    General: Skin is warm.     Findings: Bruising present. No erythema.     Comments: Bruising over the left flank area.  Full range of motion left hip.No pain on palpation over the hip. Bruise in the left flank area seems to be about 4 days ago which is consistent with the history of a fall 3 to 4 days ago.    Neurological:     General: No focal deficit present.     Mental Status: She is alert and oriented to person, place, and time.      UC Treatments / Results  Labs (all labs ordered are listed, but only abnormal results are displayed) Labs Reviewed - No data to display  EKG   Radiology No results found.  Procedures Procedures (including critical care time)  Medications Ordered in UC Medications - No data to display  Initial Impression / Assessment and Plan / UC Course  I have reviewed the triage vital signs and the nursing notes.  Pertinent labs & imaging results that were available during my care of the patient were reviewed by me and considered in my medical decision making (see chart for details).     1. Muscle contusion of the left flank area: Patient is not on blood thinners Tylenol as needed for pain Patient has no abdominal pain or discomfort or tenderness on palpation as the likelihood of retroperitoneal hemorrhage is low. If symptoms worsen ,please return to the  urgent care for re-evaluation. Final Clinical Impressions(s) / UC Diagnoses   Final diagnoses:  Muscle contusion     Discharge Instructions     Gentle range of motion exercises Tylenol as needed for pain Heating pad he Return to urgent care if symptoms are worse No indication for x-rays or imaging at this time.   ED Prescriptions     None     PDMP not reviewed this encounter.   Merrilee JanskyLamptey, Raechell Singleton O, MD 02/13/21 1655

## 2021-02-25 ENCOUNTER — Telehealth: Payer: Self-pay | Admitting: Family Medicine

## 2021-02-25 DIAGNOSIS — Z888 Allergy status to other drugs, medicaments and biological substances status: Secondary | ICD-10-CM | POA: Diagnosis not present

## 2021-02-25 DIAGNOSIS — I10 Essential (primary) hypertension: Secondary | ICD-10-CM | POA: Diagnosis not present

## 2021-02-25 DIAGNOSIS — R296 Repeated falls: Secondary | ICD-10-CM | POA: Diagnosis not present

## 2021-02-25 DIAGNOSIS — F32A Depression, unspecified: Secondary | ICD-10-CM | POA: Diagnosis not present

## 2021-02-25 DIAGNOSIS — Z885 Allergy status to narcotic agent status: Secondary | ICD-10-CM | POA: Diagnosis not present

## 2021-02-25 DIAGNOSIS — Z7982 Long term (current) use of aspirin: Secondary | ICD-10-CM | POA: Diagnosis not present

## 2021-02-25 DIAGNOSIS — Z79899 Other long term (current) drug therapy: Secondary | ICD-10-CM | POA: Diagnosis not present

## 2021-02-25 DIAGNOSIS — E785 Hyperlipidemia, unspecified: Secondary | ICD-10-CM | POA: Diagnosis not present

## 2021-02-25 DIAGNOSIS — Z880 Allergy status to penicillin: Secondary | ICD-10-CM | POA: Diagnosis not present

## 2021-02-25 DIAGNOSIS — S39012A Strain of muscle, fascia and tendon of lower back, initial encounter: Secondary | ICD-10-CM | POA: Diagnosis not present

## 2021-02-25 DIAGNOSIS — Z883 Allergy status to other anti-infective agents status: Secondary | ICD-10-CM | POA: Diagnosis not present

## 2021-02-25 DIAGNOSIS — M1991 Primary osteoarthritis, unspecified site: Secondary | ICD-10-CM | POA: Diagnosis not present

## 2021-02-25 DIAGNOSIS — K219 Gastro-esophageal reflux disease without esophagitis: Secondary | ICD-10-CM | POA: Diagnosis not present

## 2021-02-25 DIAGNOSIS — G8911 Acute pain due to trauma: Secondary | ICD-10-CM | POA: Diagnosis not present

## 2021-02-25 NOTE — Progress Notes (Signed)
  Chronic Care Management   Outreach Note  02/25/2021 Name: Nancy Wilson MRN: [id number] DOB: 05-29-1941  Referred by: Agapito Games, MD Reason for referral : No chief complaint on file.   A second unsuccessful telephone outreach was attempted today. The patient was referred to pharmacist for assistance with care management and care coordination.  Follow Up Plan:   Carmell [place] Upstream Scheduler

## 2021-03-05 ENCOUNTER — Other Ambulatory Visit: Payer: Self-pay | Admitting: Family Medicine

## 2021-03-17 ENCOUNTER — Telehealth: Payer: Self-pay | Admitting: Family Medicine

## 2021-03-17 NOTE — Progress Notes (Signed)
  Chronic Care Management   Outreach Note  03/17/2021 Name: Nancy Wilson MRN: [id number] DOB: April 01, 1941  Referred by: Agapito Games, MD Reason for referral : No chief complaint on file.   Third unsuccessful telephone outreach was attempted today. The patient was referred to the pharmacist for assistance with care management and care coordination.   Follow Up Plan:   Carmell [place] Upstream Scheduler

## 2021-04-07 ENCOUNTER — Telehealth: Payer: Self-pay | Admitting: Family Medicine

## 2021-04-07 ENCOUNTER — Ambulatory Visit (INDEPENDENT_AMBULATORY_CARE_PROVIDER_SITE_OTHER): Payer: Medicare Other | Admitting: Family Medicine

## 2021-04-07 ENCOUNTER — Encounter: Payer: Self-pay | Admitting: Family Medicine

## 2021-04-07 ENCOUNTER — Other Ambulatory Visit: Payer: Self-pay

## 2021-04-07 VITALS — BP 123/58 | HR 80 | Ht 59.0 in | Wt 163.0 lb

## 2021-04-07 DIAGNOSIS — E785 Hyperlipidemia, unspecified: Secondary | ICD-10-CM

## 2021-04-07 DIAGNOSIS — R635 Abnormal weight gain: Secondary | ICD-10-CM

## 2021-04-07 DIAGNOSIS — I10 Essential (primary) hypertension: Secondary | ICD-10-CM | POA: Diagnosis not present

## 2021-04-07 DIAGNOSIS — N1831 Chronic kidney disease, stage 3a: Secondary | ICD-10-CM

## 2021-04-07 DIAGNOSIS — E039 Hypothyroidism, unspecified: Secondary | ICD-10-CM

## 2021-04-07 DIAGNOSIS — Z1159 Encounter for screening for other viral diseases: Secondary | ICD-10-CM | POA: Diagnosis not present

## 2021-04-07 DIAGNOSIS — R296 Repeated falls: Secondary | ICD-10-CM | POA: Diagnosis not present

## 2021-04-07 MED ORDER — FUROSEMIDE 20 MG PO TABS: 20.0000 mg | ORAL_TABLET | Freq: Every day | ORAL | 0 refills | Status: DC | PRN

## 2021-04-07 NOTE — Progress Notes (Signed)
Established Patient Office Visit  Subjective:  Patient ID: Nancy Wilson, female    DOB: 1941-08-04  Age: 80 y.o. MRN: [id number]  CC:  Chief Complaint  Patient presents with   Follow-up    4 month Hypertension follow up    HPI Nancy Wilson presents for   Hypertension- Pt denies chest pain, SOB, dizziness, or heart palpitations.  Taking meds as directed w/o problems.  Denies medication side effects.    Unfortunately she is continued to have frequent falls.  She was actually in the emergency department recently as she fell and hit her back on the toilet paper holder it actually ripped it off the wall and ripped the drywall.  She still has a little bit of a lump on her left low back and wanted me to look at it today but it is feeling better in general.  She is also concerned about weight gain.  When she was last here she thought it might be the carbidopa levodopa so she had stopped it for several weeks she actually has restarted it and is taking it some since then.  She is concerned because she still continuing to gain weight.   Past Medical History:  Diagnosis Date   Allergy    Depression    long standing   Hyperlipidemia    Hypertension     Past Surgical History:  Procedure Laterality Date   carpel tunnel release, both hands  1980's   DILATION AND CURETTAGE OF UTERUS     FRACTURE SURGERY     fell and broke right arm   GASTROSCOPY  11-06   trigger finger release, left finger     TUBAL LIGATION     bilateral    Family History  Problem Relation Age of Onset   Diabetes Mother    Hyperlipidemia Mother    Heart disease Mother 58   Hypertension Mother    Diabetes Father    Hyperlipidemia Father    Tremor Father        hand- started in his 75's   Hypertension Father    Tremor Cousin     Social History   Socioeconomic History   Marital status: Married    Spouse name: roger   Number of children: 2   Years of education: 9th   Highest education level: 9th  grade  Occupational History   Occupation: Housewife    Comment: retired  Tobacco Use   Smoking status: Never   Smokeless tobacco: Never  Building services engineer Use: Never used  Substance and Sexual Activity   Alcohol use: No    Alcohol/week: 0.0 standard drinks   Drug use: No   Sexual activity: Not Currently  Other Topics Concern   Not on file  Social History Narrative   No caffeine use. Doing physical therapy to help with her balance issue   Social Determinants of Health   Financial Resource Strain: Not on file  Food Insecurity: Not on file  Transportation Needs: Not on file  Physical Activity: Not on file  Stress: Not on file  Social Connections: Not on file  Intimate Partner Violence: Not on file    Outpatient Medications Prior to Visit  Medication Sig Dispense Refill   acyclovir (ZOVIRAX) 400 MG tablet TAKE 1 TABLET BY MOUTH AT  BEDTIME 90 tablet 1   alendronate (FOSAMAX) 70 MG tablet TAKE 1 TABLET BY MOUTH  WEEKLY WITH 8 OZ OF PLAIN  WATER 30 MINUTES BEFORE  FIRST  FOOD, DRINK OR MEDS.  STAY UPRIGHT FOR 30 MINS 12 tablet 3   amLODipine (NORVASC) 10 MG tablet TAKE 1 TABLET BY MOUTH  DAILY 90 tablet 3   ASPIRIN 81 PO Take by mouth.     atorvastatin (LIPITOR) 40 MG tablet TAKE 1 TABLET BY MOUTH  DAILY 90 tablet 3   buPROPion (WELLBUTRIN XL) 300 MG 24 hr tablet TAKE 1 TABLET BY MOUTH  DAILY 90 tablet 3   carbidopa-levodopa (SINEMET IR) 25-100 MG tablet TAKE 1 TABLET BY MOUTH  DAILY AT BEDTIME 90 tablet 3   Cholecalciferol (VITAMIN D3) 2000 units capsule Take 2,000 Units by mouth daily.     diclofenac Sodium (VOLTAREN) 1 % GEL Apply topically.     eletriptan (RELPAX) 20 MG tablet Take 1 tablet (20 mg total) by mouth as needed for migraine or headache. May repeat in 2 hours if headache persists or recurs. 12 tablet 4   omeprazole (PRILOSEC) 40 MG capsule Take 1 capsule (40 mg total) by mouth daily. 30 capsule 3   oxybutynin (DITROPAN) 5 MG tablet TAKE 1 TABLET BY MOUTH  TWICE  DAILY 180 tablet 3   potassium chloride (KLOR-CON) 10 MEQ tablet Take 1 tablet (10 mEq total) by mouth daily. With furosemide 90 tablet 0   promethazine (PHENERGAN) 25 MG tablet Take 1 tablet (25 mg total) by mouth every 8 (eight) hours as needed for nausea or vomiting. 20 tablet 3   propranolol (INDERAL) 40 MG tablet TAKE 1 TABLET BY MOUTH  TWICE DAILY 180 tablet 3   sertraline (ZOLOFT) 100 MG tablet TAKE 1 TABLET BY MOUTH  DAILY 90 tablet 3   fluticasone (FLONASE) 50 MCG/ACT nasal spray Place 2 sprays into both nostrils daily. 16 g 0   furosemide (LASIX) 20 MG tablet Take 1 tablet (20 mg total) by mouth daily as needed. 90 tablet 0   No facility-administered medications prior to visit.    Allergies  Allergen Reactions   Hydrocodone-Acetaminophen Hives   Codeine Other (See Comments)   Cymbalta [Duloxetine Hcl] Other (See Comments)   Doxycycline    Effexor [Venlafaxine] Other (See Comments)    Tearful   Guaifenesin    Meperidine Other (See Comments)   Meperidine Hcl    Metronidazole    Morphine Other (See Comments)   Penicillins    Prednisone Other (See Comments)   Propoxyphene Nausea And Vomiting   Prozac [Fluoxetine Hcl] Other (See Comments)   Ropinirole Other (See Comments)    Sweaty and tachycardic    ROS Review of Systems    Objective:    Physical Exam Constitutional:      Appearance: Normal appearance. She is well-developed.  HENT:     Head: Normocephalic and atraumatic.  Cardiovascular:     Rate and Rhythm: Normal rate and regular rhythm.     Heart sounds: Normal heart sounds.  Pulmonary:     Effort: Pulmonary effort is normal.     Breath sounds: Normal breath sounds.  Musculoskeletal:     Comments: On her left mid back she does have a hard area where it looks like she likely had a hematoma it may be that it is calcifying.  We did discuss massaging the area.  Skin:    General: Skin is warm and dry.  Neurological:     Mental Status: She is alert and  oriented to person, place, and time.  Psychiatric:        Behavior: Behavior normal.    BP (!) 123/58  Pulse 80   Ht 4\' 11"  (1.499 m)   Wt 163 lb (73.9 kg)   SpO2 97%   BMI 32.92 kg/m  Wt Readings from Last 3 Encounters:  04/07/21 163 lb (73.9 kg)  02/11/21 159 lb (72.1 kg)  12/03/20 163 lb (73.9 kg)     Health Maintenance Due  Topic Date Due   Hepatitis C Screening  Never done   Zoster Vaccines- Shingrix (1 of 2) Never done   COVID-19 Vaccine (3 - Pfizer risk series) 01/23/2020    There are no preventive care reminders to display for this patient.  Lab Results  Component Value Date   TSH 2.56 09/17/2020   Lab Results  Component Value Date   WBC 5.8 09/17/2020   HGB 13.4 09/17/2020   HCT 39.9 09/17/2020   MCV 94.8 09/17/2020   PLT 261 09/17/2020   Lab Results  Component Value Date   NA 138 09/24/2020   K 4.3 09/24/2020   CO2 25 09/24/2020   GLUCOSE 91 09/24/2020   BUN 18 09/24/2020   CREATININE 1.15 (H) 09/24/2020   BILITOT 0.4 09/17/2020   ALKPHOS 92 12/02/2016   AST 18 09/17/2020   ALT 12 09/17/2020   PROT 6.3 09/17/2020   ALBUMIN 4.3 12/02/2016   CALCIUM 9.7 09/24/2020   Lab Results  Component Value Date   CHOL 229 (H) 11/30/2019   Lab Results  Component Value Date   HDL 61 11/30/2019   Lab Results  Component Value Date   LDLCALC 148 (H) 11/30/2019   Lab Results  Component Value Date   TRIG 94 11/30/2019   Lab Results  Component Value Date   CHOLHDL 3.8 11/30/2019   No results found for: HGBA1C    Assessment & Plan:   Problem List Items Addressed This Visit       Cardiovascular and Mediastinum   HYPERTENSION, BENIGN - Primary    Well controlled. Continue current regimen. Follow up in  6 mo        Relevant Medications   furosemide (LASIX) 20 MG tablet   Other Relevant Orders   COMPLETE METABOLIC PANEL WITH GFR   TSH   Lipid Panel w/reflex Direct LDL     Endocrine   Hypothyroidism   Relevant Orders   COMPLETE  METABOLIC PANEL WITH GFR   TSH   Lipid Panel w/reflex Direct LDL     Genitourinary   CKD (chronic kidney disease) stage 3, GFR 30-59 ml/min (HCC)    Recheck renal function          Other   Hyperlipidemia   Relevant Medications   furosemide (LASIX) 20 MG tablet   Other Relevant Orders   COMPLETE METABOLIC PANEL WITH GFR   TSH   Lipid Panel w/reflex Direct LDL   Frequent falls    Really want a get her back in with neurology.  Do think this is related to her Parkinson's.  She is okay with going back to see the neurologist that she saw in [facility].       Other Visit Diagnoses     Encounter for hepatitis C screening test for low risk patient       Relevant Orders   Hepatitis C Antibody   Weight gain          Hematoma left mid back-encouraged her to massage the area to keep it from calcifying over time.  Weight gain-plan to recheck TSH.  Did discuss really working on portion sizes and cutting back  on carb intake.  She says she plans on scheduling her mammogram she knows she is due she said she did receive a reminder letter.  We will get updated labs today and check renal function.  Meds ordered this encounter  Medications   furosemide (LASIX) 20 MG tablet    Sig: Take 1 tablet (20 mg total) by mouth daily as needed.    Dispense:  90 tablet    Refill:  0    Follow-up: Return in about 6 months (around 10/08/2021) for Hypertension.    Nani Gasser, MD

## 2021-04-07 NOTE — Assessment & Plan Note (Signed)
Really want a get her back in with neurology.  Do think this is related to her Parkinson's.  She is okay with going back to see the neurologist that she saw in [facility].

## 2021-04-07 NOTE — Assessment & Plan Note (Signed)
Recheck renal function. ?

## 2021-04-07 NOTE — Assessment & Plan Note (Signed)
Well controlled. Continue current regimen. Follow up in  6 mo  

## 2021-04-07 NOTE — Telephone Encounter (Signed)
Please give patient Dr. Larina Earthly number to call to schedule a follow-up.

## 2021-04-07 NOTE — Patient Instructions (Addendum)
Please call to schedule your mammogram this summer.  Please give patient Dr. Larina Earthly (Neurology) number to call to schedule a follow-up.

## 2021-04-11 DIAGNOSIS — G8911 Acute pain due to trauma: Secondary | ICD-10-CM | POA: Diagnosis not present

## 2021-04-11 DIAGNOSIS — M7989 Other specified soft tissue disorders: Secondary | ICD-10-CM | POA: Diagnosis not present

## 2021-04-11 DIAGNOSIS — K219 Gastro-esophageal reflux disease without esophagitis: Secondary | ICD-10-CM | POA: Diagnosis not present

## 2021-04-11 DIAGNOSIS — Z886 Allergy status to analgesic agent status: Secondary | ICD-10-CM | POA: Diagnosis not present

## 2021-04-11 DIAGNOSIS — Z7982 Long term (current) use of aspirin: Secondary | ICD-10-CM | POA: Diagnosis not present

## 2021-04-11 DIAGNOSIS — Z880 Allergy status to penicillin: Secondary | ICD-10-CM | POA: Diagnosis not present

## 2021-04-11 DIAGNOSIS — S93402A Sprain of unspecified ligament of left ankle, initial encounter: Secondary | ICD-10-CM | POA: Diagnosis not present

## 2021-04-11 DIAGNOSIS — E785 Hyperlipidemia, unspecified: Secondary | ICD-10-CM | POA: Diagnosis not present

## 2021-04-11 DIAGNOSIS — I10 Essential (primary) hypertension: Secondary | ICD-10-CM | POA: Diagnosis not present

## 2021-04-11 DIAGNOSIS — Z79899 Other long term (current) drug therapy: Secondary | ICD-10-CM | POA: Diagnosis not present

## 2021-04-11 DIAGNOSIS — Z888 Allergy status to other drugs, medicaments and biological substances status: Secondary | ICD-10-CM | POA: Diagnosis not present

## 2021-04-11 DIAGNOSIS — Z885 Allergy status to narcotic agent status: Secondary | ICD-10-CM | POA: Diagnosis not present

## 2021-04-15 ENCOUNTER — Ambulatory Visit (INDEPENDENT_AMBULATORY_CARE_PROVIDER_SITE_OTHER): Payer: Medicare Other | Admitting: Family Medicine

## 2021-04-15 DIAGNOSIS — Z780 Asymptomatic menopausal state: Secondary | ICD-10-CM | POA: Diagnosis not present

## 2021-04-15 DIAGNOSIS — Z0000 Encounter for general adult medical examination without abnormal findings: Secondary | ICD-10-CM

## 2021-04-15 NOTE — Patient Instructions (Addendum)
MEDICARE ANNUAL WELLNESS VISIT Health Maintenance Summary and Written Plan of Care  Nancy Wilson ,  Thank you for allowing me to perform your Medicare Annual Wellness Visit and for your ongoing commitment to your health.   Health Maintenance & Immunization History Health Maintenance  Topic Date Due   COVID-19 Vaccine (3 - Pfizer risk series) 05/01/2021 (Originally 01/23/2020)   Zoster Vaccines- Shingrix (1 of 2) 07/16/2021 (Originally 09/29/1959)   Hepatitis C Screening  04/15/2022 (Originally 09/30/1958)   INFLUENZA VACCINE  04/20/2021   TETANUS/TDAP  05/01/2025   DEXA SCAN  Completed   PNA vac Low Risk Adult  Completed   HPV VACCINES  Aged Out   Immunization History  Administered Date(s) Administered   Fluad Quad(high Dose 65+) 06/11/2019, 07/18/2020   Influenza Whole 06/24/2009   Influenza, High Dose Seasonal PF 08/23/2011, 05/02/2015, 07/30/2016   Influenza, Quadrivalent, Recombinant, Inj, Pf 05/30/2017   Influenza,inj,Quad PF,6+ Mos 07/03/2018   Influenza-Unspecified 07/25/2014   PFIZER(Purple Top)SARS-COV-2 Vaccination 11/28/2019, 12/26/2019   Pneumococcal Conjugate-13 09/22/2009, 08/13/2014, 07/26/2017   Pneumococcal Polysaccharide-23 07/21/2008   Td 09/21/2003   Tdap 05/02/2015    These are the patient goals that we discussed:  Goals Addressed               This Visit's Progress     Patient Stated (pt-stated)        04/15/2021 AWV Goal: Exercise for General Health  Patient will verbalize understanding of the benefits of increased physical activity: Exercising regularly is important. It will improve your overall fitness, flexibility, and endurance. Regular exercise also will improve your overall health. It can help you control your weight, reduce stress, and improve your bone density. Over the next year, patient will increase physical activity as tolerated with a goal of at least 150 minutes of moderate physical activity per week.  You can tell that you are  exercising at a moderate intensity if your heart starts beating faster and you start breathing faster but can still hold a conversation. Moderate-intensity exercise ideas include: Walking 1 mile (1.6 km) in about 15 minutes Biking Hiking Golfing Dancing Water aerobics Patient will verbalize understanding of everyday activities that increase physical activity by providing examples like the following: Yard work, such as: Insurance underwriter Gardening Washing windows or floors Patient will be able to explain general safety guidelines for exercising:  Before you start a new exercise program, talk with your health care provider. Do not exercise so much that you hurt yourself, feel dizzy, or get very short of breath. Wear comfortable clothes and wear shoes with good support. Drink plenty of water while you exercise to prevent dehydration or heat stroke. Work out until your breathing and your heartbeat get faster.          This is a list of Health Maintenance Items that are overdue or due now: Bone densitometry screening Shingles vacccine  Orders/Referrals Placed Today: No orders of the defined types were placed in this encounter.  (Contact our referral department at [phone] if you have not spoken with someone about your referral appointment within the next 5 days)    Follow-up Plan Follow-up with Agapito Games, MD as planned Schedule your shingles vaccine at your pharmacy.  Referral for your bone density scan has been sent and they will call you to schedule. Medicare wellness visit in one year.  AVS printed and mailed to the patient.

## 2021-04-15 NOTE — Progress Notes (Signed)
MEDICARE ANNUAL WELLNESS VISIT  04/15/2021  Telephone Visit Disclaimer This Medicare AWV was conducted by telephone due to national recommendations for restrictions regarding the COVID-19 Pandemic (e.g. social distancing).  I verified, using two identifiers, that I am speaking with Nancy Wilson or their authorized healthcare agent. I discussed the limitations, risks, security, and privacy concerns of performing an evaluation and management service by telephone and the potential availability of an in-person appointment in the future. The patient expressed understanding and agreed to proceed.  Location of Patient: Home Location of Provider (nurse):  In the office.  Subjective:    Nancy Wilson is a 80 y.o. female patient of Metheney, Barbarann Ehlersatherine D, MD who had a Medicare Annual Wellness Visit today via telephone. Nancy Wilson is Retired and lives with their spouse and daughter. she has 2 children. she reports that she is socially active and does interact with friends/family regularly. she is not physically active and enjoys gardening.  Patient Care Team: Agapito GamesMetheney, Catherine D, MD as PCP - General Curt Bearsonuzi, Lirim, MD as Referring Physician (Neurology)  Advanced Directives 04/15/2021 07/18/2019 07/02/2019 07/03/2018  Does Patient Have a Medical Advance Directive? Yes Yes Yes Yes  Type of Advance Directive Living will;Healthcare Power of English as a second language teacherAttorney - Healthcare Power of [place]orney;Living will Healthcare Power of [place]orney;Living will  Does patient want to make changes to medical advance directive? No - Patient declined No - Patient declined No - Patient declined Yes (MAU/Ambulatory/Procedural Areas - Information given)  Copy of Healthcare Power of Attorney in Chart? No - copy requested No - copy requested No - copy requested No - copy requested  Would patient like information on creating a medical advance directive? - - - No - Patient declined    Hospital Utilization Over the Past 12 Months: # of  hospitalizations or ER visits: 2 # of surgeries: 0  Review of Systems    Patient reports that her overall health is worse compared to last year.  History obtained from chart review and the patient  Patient Reported Readings (BP, Pulse, CBG, Weight, etc) none  Pain Assessment Pain : 0-10 Pain Score: 7  Pain Type: Acute pain Pain Location: Ankle Pain Orientation: Left Pain Descriptors / Indicators: Aching Pain Onset: In the past 7 days Pain Frequency: Constant Pain Relieving Factors: medication  Pain Relieving Factors: medication  Current Medications & Allergies (verified) Allergies as of 04/15/2021       Reactions   Hydrocodone-acetaminophen Hives   Codeine Other (See Comments)   Cymbalta [duloxetine Hcl] Other (See Comments)   Doxycycline    Effexor [venlafaxine] Other (See Comments)   Tearful   Guaifenesin    Meperidine Other (See Comments)   Meperidine Hcl    Metronidazole    Morphine Other (See Comments)   Penicillins    Prednisone Other (See Comments)   Propoxyphene Nausea And Vomiting   Prozac [fluoxetine Hcl] Other (See Comments)   Ropinirole Other (See Comments)   Sweaty and tachycardic        Medication List        Accurate as of April 15, 2021  1:42 PM. If you have any questions, ask your nurse or doctor.          acyclovir 400 MG tablet Commonly known as: ZOVIRAX TAKE 1 TABLET BY MOUTH AT  BEDTIME   alendronate 70 MG tablet Commonly known as: FOSAMAX TAKE 1 TABLET BY MOUTH  WEEKLY WITH 8 OZ OF PLAIN  WATER 30 MINUTES BEFORE  FIRST FOOD, DRINK  OR MEDS.  STAY UPRIGHT FOR 30 MINS   amLODipine 10 MG tablet Commonly known as: NORVASC TAKE 1 TABLET BY MOUTH  DAILY   ASPIRIN 81 PO Take by mouth.   atorvastatin 40 MG tablet Commonly known as: LIPITOR TAKE 1 TABLET BY MOUTH  DAILY   buPROPion 300 MG 24 hr tablet Commonly known as: WELLBUTRIN XL TAKE 1 TABLET BY MOUTH  DAILY   carbidopa-levodopa 25-100 MG tablet Commonly known as:  SINEMET IR TAKE 1 TABLET BY MOUTH  DAILY AT BEDTIME   diclofenac Sodium 1 % Gel Commonly known as: VOLTAREN Apply topically.   eletriptan 20 MG tablet Commonly known as: RELPAX Take 1 tablet (20 mg total) by mouth as needed for migraine or headache. May repeat in 2 hours if headache persists or recurs.   furosemide 20 MG tablet Commonly known as: LASIX Take 1 tablet (20 mg total) by mouth daily as needed.   ibuprofen 100 MG tablet Commonly known as: ADVIL Take 100 mg by mouth every 6 (six) hours as needed for pain. 2 tablets every 8 hours.   omeprazole 40 MG capsule Commonly known as: PRILOSEC Take 1 capsule (40 mg total) by mouth daily.   oxybutynin 5 MG tablet Commonly known as: DITROPAN TAKE 1 TABLET BY MOUTH  TWICE DAILY   potassium chloride 10 MEQ tablet Commonly known as: KLOR-CON Take 1 tablet (10 mEq total) by mouth daily. With furosemide   promethazine 25 MG tablet Commonly known as: PHENERGAN Take 1 tablet (25 mg total) by mouth every 8 (eight) hours as needed for nausea or vomiting.   propranolol 40 MG tablet Commonly known as: INDERAL TAKE 1 TABLET BY MOUTH  TWICE DAILY   sertraline 100 MG tablet Commonly known as: ZOLOFT TAKE 1 TABLET BY MOUTH  DAILY   Vitamin D3 50 MCG (2000 UT) capsule Take 2,000 Units by mouth daily.        History (reviewed): Past Medical History:  Diagnosis Date   Allergy    Depression    long standing   Hyperlipidemia    Hypertension    Past Surgical History:  Procedure Laterality Date   carpel tunnel release, both hands  1980's   DILATION AND CURETTAGE OF UTERUS     FRACTURE SURGERY     fell and broke right arm   GASTROSCOPY  11-06   trigger finger release, left finger     TUBAL LIGATION     bilateral   Family History  Problem Relation Age of Onset   Diabetes Mother    Hyperlipidemia Mother    Heart disease Mother 78   Hypertension Mother    Diabetes Father    Hyperlipidemia Father    Tremor Father         hand- started in his 17's   Hypertension Father    Tremor Cousin    Social History   Socioeconomic History   Marital status: Married    Spouse name: roger   Number of children: 2   Years of education: 10   Highest education level: 10th grade  Occupational History   Occupation: Housewife    Comment: retired  Tobacco Use   Smoking status: Never   Smokeless tobacco: Never  Building services engineer Use: Never used  Substance and Sexual Activity   Alcohol use: No    Alcohol/week: 0.0 standard drinks   Drug use: No   Sexual activity: Not Currently  Other Topics Concern   Not on file  Social History Narrative   Lives with her husband her daughter. She is not doing any exercise right now. She enjoys gardening in her free time.    Social Determinants of Health   Financial Resource Strain: Low Risk    Difficulty of Paying Living Expenses: Not hard at all  Food Insecurity: No Food Insecurity   Worried About Programme researcher, broadcasting/film/video in the Last Year: Never true   Ran Out of Food in the Last Year: Never true  Transportation Needs: No Transportation Needs   Lack of Transportation (Medical): No   Lack of Transportation (Non-Medical): No  Physical Activity: Inactive   Days of Exercise per Week: 0 days   Minutes of Exercise per Session: 0 min  Stress: No Stress Concern Present   Feeling of Stress : Not at all  Social Connections: Moderately Isolated   Frequency of Communication with Friends and Family: More than three times a week   Frequency of Social Gatherings with Friends and Family: More than three times a week   Attends Religious Services: Never   Database administrator or Organizations: No   Attends Banker Meetings: Never   Marital Status: Married    Activities of Daily Living In your present state of health, do you have any difficulty performing the following activities: 04/15/2021  Hearing? N  Vision? N  Difficulty concentrating or making decisions? N  Walking  or climbing stairs? Y  Comment due to a recent fall and an injury to her ankle.  Dressing or bathing? Y  Comment her daughter helps her due to a recent fall and an injury to her ankle.  Doing errands, shopping? Y  Comment Her husband drives her to the doctor's office and groceries.  Preparing Food and eating ? Y  Comment her daughter helps her due to a recent fall and an injury to her ankle.  Using the Toilet? Y  Comment her daughter helps her due to a recent fall and an injury to her ankle.  In the past six months, have you accidently leaked urine? Y  Comment she wears a pad due to a leaking problem.  Do you have problems with loss of bowel control? N  Managing your Medications? N  Managing your Finances? N  Housekeeping or managing your Housekeeping? Y  Comment her daughter helps her due to a recent fall and an injury to her ankle.  Some recent data might be hidden    Patient Education/ Literacy How often do you need to have someone help you when you read instructions, pamphlets, or other written materials from your doctor or pharmacy?: 1 - Never What is the last grade level you completed in school?: 10th grade  Exercise Current Exercise Habits: The patient does not participate in regular exercise at present, Exercise limited by: orthopedic condition(s)  Diet Patient reports consuming 2 meals a day and 1 snack(s) a day Patient reports that her primary diet is: Regular Patient reports that she does have regular access to food.   Depression Screen PHQ 2/9 Scores 04/15/2021 04/07/2021 12/03/2020 12/03/2020 02/21/2020 11/22/2019 09/24/2019  PHQ - 2 Score 0 0 1 1 3  0 2  PHQ- 9 Score 0 6 2 - 8 0 7     Fall Risk Fall Risk  04/15/2021 04/07/2021 12/03/2020 09/24/2019 07/18/2019  Falls in the past year? 1 1 1 1 1   Comment - Last fall in June 2022 went  to ED for back pain. - - -  Number  falls in past yr: 1 1 1 1 1   Injury with Fall? 1 0 0 1 0  Risk for fall due to : History of fall(s) - - Other  (Comment) History of fall(s);Impaired balance/gait;Impaired mobility  Follow up Falls evaluation completed;Education provided;Falls prevention discussed - Falls evaluation completed - Falls prevention discussed     Objective:  Labate seemed alert and oriented and she participated appropriately during our telephone visit.  Blood Pressure Weight BMI  BP Readings from Last 3 Encounters:  04/07/21 (!) 123/58  02/11/21 (!) 156/88  12/03/20 (!) 169/90   Wt Readings from Last 3 Encounters:  04/07/21 163 lb (73.9 kg)  02/11/21 159 lb (72.1 kg)  12/03/20 163 lb (73.9 kg)   BMI Readings from Last 1 Encounters:  04/07/21 32.92 kg/m    *Unable to obtain current vital signs, weight, and BMI due to telephone visit type  Hearing/Vision  04/09/21 did not seem to have difficulty with hearing/understanding during the telephone conversation Reports that she has not had a formal eye exam by an eye care professional within the past year Reports that she has not had a formal hearing evaluation within the past year *Unable to fully assess hearing and vision during telephone visit type  Cognitive Function: 6CIT Screen 04/15/2021 07/18/2019 07/03/2018  What Year? 4 points 0 points 4 points  What month? 0 points 0 points 0 points  What time? 0 points 0 points 0 points  Count back from 20 0 points 0 points 0 points  Months in reverse 2 points 0 points 2 points  Repeat phrase 0 points 0 points 0 points  Total Score 6 0 6   (Normal:0-7, Significant for Dysfunction: >8)  Normal Cognitive Function Screening: Yes   Immunization & Health Maintenance Record Immunization History  Administered Date(s) Administered   Fluad Quad(high Dose 65+) 06/11/2019, 07/18/2020   Influenza Whole 06/24/2009   Influenza, High Dose Seasonal PF 08/23/2011, 05/02/2015, 07/30/2016   Influenza, Quadrivalent, Recombinant, Inj, Pf 05/30/2017   Influenza,inj,Quad PF,6+ Mos 07/03/2018   Influenza-Unspecified 07/25/2014    PFIZER(Purple Top)SARS-COV-2 Vaccination 11/28/2019, 12/26/2019   Pneumococcal Conjugate-13 09/22/2009, 08/13/2014, 07/26/2017   Pneumococcal Polysaccharide-23 07/21/2008   Td 09/21/2003   Tdap 05/02/2015    Health Maintenance  Topic Date Due   COVID-19 Vaccine (3 - Pfizer risk series) 05/01/2021 (Originally 01/23/2020)   Zoster Vaccines- Shingrix (1 of 2) 07/16/2021 (Originally 09/29/1959)   Hepatitis C Screening  04/15/2022 (Originally 09/30/1958)   INFLUENZA VACCINE  04/20/2021   TETANUS/TDAP  05/01/2025   DEXA SCAN  Completed   PNA vac Low Risk Adult  Completed   HPV VACCINES  Aged Out       Assessment  This is a routine wellness examination for 07/01/2025.  Health Maintenance: Due or Overdue There are no preventive care reminders to display for this patient.   [employer] Rijo does not need a referral for Community Assistance: Care Management:   no Social Work:    no Prescription Assistance:  no Nutrition/Diabetes Education:  no   Plan:  Personalized Goals  Goals Addressed               This Visit's Progress     Patient Stated (pt-stated)        04/15/2021 AWV Goal: Exercise for General Health  Patient will verbalize understanding of the benefits of increased physical activity: Exercising regularly is important. It will improve your overall fitness, flexibility, and endurance. Regular exercise also will improve your  overall health. It can help you control your weight, reduce stress, and improve your bone density. Over the next year, patient will increase physical activity as tolerated with a goal of at least 150 minutes of moderate physical activity per week.  You can tell that you are exercising at a moderate intensity if your heart starts beating faster and you start breathing faster but can still hold a conversation. Moderate-intensity exercise ideas include: Walking 1 mile (1.6 km) in about 15 minutes Biking Hiking Golfing Dancing Water  aerobics Patient will verbalize understanding of everyday activities that increase physical activity by providing examples like the following: Yard work, such as: Insurance underwriter Gardening Washing windows or floors Patient will be able to explain general safety guidelines for exercising:  Before you start a new exercise program, talk with your health care provider. Do not exercise so much that you hurt yourself, feel dizzy, or get very short of breath. Wear comfortable clothes and wear shoes with good support. Drink plenty of water while you exercise to prevent dehydration or heat stroke. Work out until your breathing and your heartbeat get faster.        Personalized Health Maintenance & Screening Recommendations  Bone densitometry screening Shingles vacccine  Lung Cancer Screening Recommended: no (Low Dose CT Chest recommended if Age 69-80 years, 30 pack-year currently smoking OR have quit w/in past 15 years) Hepatitis C Screening recommended: yes HIV Screening recommended: yes  Advanced Directives: Written information was not prepared per patient's request.  Referrals & Orders Orders Placed This Encounter  Procedures   DEXAScan     Follow-up Plan Follow-up with Agapito Games, MD as planned Schedule your shingles vaccine at your pharmacy.  Referral for your bone density scan has been sent and they will call you to schedule. Medicare wellness visit in one year.  AVS printed and mailed to the patient.   I have personally reviewed and noted the following in the patient's chart:   Medical and social history Use of alcohol, tobacco or illicit drugs  Current medications and supplements Functional ability and status Nutritional status Physical activity Advanced directives List of other physicians Hospitalizations, surgeries, and ER visits in previous 12  months Vitals Screenings to include cognitive, depression, and falls Referrals and appointments  In addition, I have reviewed and discussed with Nancy Casa certain preventive protocols, quality metrics, and best practice recommendations. A written personalized care plan for preventive services as well as general preventive health recommendations is available and can be mailed to the patient at her request.      Modesto Charon, RN  04/15/2021

## 2021-04-16 ENCOUNTER — Ambulatory Visit: Payer: Medicare Other | Admitting: Family Medicine

## 2021-04-17 ENCOUNTER — Ambulatory Visit: Payer: Medicare Other

## 2021-04-17 ENCOUNTER — Encounter: Payer: Self-pay | Admitting: Physician Assistant

## 2021-04-17 ENCOUNTER — Other Ambulatory Visit: Payer: Self-pay

## 2021-04-17 ENCOUNTER — Ambulatory Visit (INDEPENDENT_AMBULATORY_CARE_PROVIDER_SITE_OTHER): Payer: Medicare Other | Admitting: Physician Assistant

## 2021-04-17 ENCOUNTER — Ambulatory Visit (INDEPENDENT_AMBULATORY_CARE_PROVIDER_SITE_OTHER): Payer: Medicare Other

## 2021-04-17 VITALS — BP 139/63 | HR 88

## 2021-04-17 DIAGNOSIS — M25552 Pain in left hip: Secondary | ICD-10-CM

## 2021-04-17 DIAGNOSIS — S99912D Unspecified injury of left ankle, subsequent encounter: Secondary | ICD-10-CM | POA: Diagnosis not present

## 2021-04-17 DIAGNOSIS — S99912A Unspecified injury of left ankle, initial encounter: Secondary | ICD-10-CM | POA: Diagnosis not present

## 2021-04-17 DIAGNOSIS — W19XXXD Unspecified fall, subsequent encounter: Secondary | ICD-10-CM | POA: Diagnosis not present

## 2021-04-17 DIAGNOSIS — S93402A Sprain of unspecified ligament of left ankle, initial encounter: Secondary | ICD-10-CM | POA: Insufficient documentation

## 2021-04-17 DIAGNOSIS — S93402D Sprain of unspecified ligament of left ankle, subsequent encounter: Secondary | ICD-10-CM

## 2021-04-17 MED ORDER — TRAMADOL HCL 50 MG PO TABS: 50.0000 mg | ORAL_TABLET | Freq: Two times a day (BID) | ORAL | 0 refills | Status: DC | PRN

## 2021-04-17 NOTE — Patient Instructions (Signed)
Ankle Sprain  An ankle sprain is a stretch or tear in one of the tough tissues (ligaments) that connect the bones in your ankle. An ankle sprain can happen when the ankle rolls outward (inversion sprain) or inward (eversion sprain). What are the causes? This condition is caused by rolling or twisting the ankle. What increases the risk? You are more likely to develop this condition if you play sports. What are the signs or symptoms? Symptoms of this condition include: Pain in your ankle. Swelling. Bruising. This may happen right after you sprain your ankle or 1-2 days later. Trouble standing or walking. How is this diagnosed? This condition is diagnosed with: A physical exam. During the exam, your doctor will press on certain parts of your foot and ankle and try to move them in certain ways. X-ray imaging. These may be taken to see how bad the sprain is and to check for broken bones. How is this treated? This condition may be treated with: A brace or splint. This is used to keep the ankle from moving until it heals. An elastic bandage. This is used to support the ankle. Crutches. Pain medicine. Surgery. This may be needed if the sprain is very bad. Physical therapy. This may help to improve movement in the ankle. Follow these instructions at home: If you have a brace or a splint: Wear the brace or splint as told by your doctor. Remove it only as told by your doctor. Loosen the brace or splint if your toes: Tingle. Lose feeling (become numb). Turn cold and blue. Keep the brace or splint clean. If the brace or splint is not waterproof: Do not let it get wet. Cover it with a watertight covering when you take a bath or a shower. If you have an elastic bandage (dressing): Remove it to shower or bathe. Try not to move your ankle much, but wiggle your toes from time to time. This helps to prevent swelling. Adjust the dressing if it feels too tight. Loosen the dressing if your  foot: Loses feeling. Tingles. Becomes cold and blue. Managing pain, stiffness, and swelling  Take over-the-counter and prescription medicines only as told by doctor. For 2-3 days, keep your ankle raised (elevated) above the level of your heart. If told, put ice on the injured area: If you have a removable brace or splint, remove it as told by your doctor. Put ice in a plastic bag. Place a towel between your skin and the bag. Leave the ice on for 20 minutes, 2-3 times a day.  General instructions Rest your ankle. Do not use your injured leg to support your body weight until your doctor says that you can. Use crutches as told by your doctor. Do not use any products that contain nicotine or tobacco, such as cigarettes, e-cigarettes, and chewing tobacco. If you need help quitting, ask your doctor. Keep all follow-up visits as told by your doctor. Contact a doctor if: Your bruises or swelling are quickly getting worse. Your pain does not get better after you take medicine. Get help right away if: You cannot feel your toes or foot. Your foot or toes look blue. You have very bad pain that gets worse. Summary An ankle sprain is a stretch or tear in one of the tough tissues (ligaments) that connect the bones in your ankle. This condition is caused by rolling or twisting the ankle. Symptoms include pain, swelling, bruising, and trouble walking. To help with pain and swelling, put ice on the   injured ankle, raise your ankle above the level of your heart, and use an elastic bandage. Also, rest as told by your doctor. Keep all follow-up visits as told by your doctor. This is important. This information is not intended to replace advice given to you by your health care provider. Make sure you discuss any questions you have with your healthcare provider. Document Revised: 01/31/2018 Document Reviewed: 01/31/2018 Elsevier Patient Education  2022 Elsevier Inc[employer]  

## 2021-04-17 NOTE — Progress Notes (Signed)
Subjective:    Patient ID: Nancy Wilson, female    DOB: [birth year], 80 y.o.   MRN: [id number]  HPI Pt is a 80 yo obese female who is high fall risk with HTN, osteoarthritis, osteopenia who presents to the clinic to follow up on left ankle sprain after fall on 04/11/2021. She went to UC same day 04/11/2021 after slipping down the stairs. Xray of left foot and ankle were done and negative for fracture. She has been using tylenol, motrin, ice, compression and elevation and not improving. She is also complaining of left hip pain after fall as well. She rates the pain 8/10 today. Per patient she was told "it was hard to see if there was a fracture due to all her swelling" she wants xrays completed.    .. Active Ambulatory Problems    Diagnosis Date Noted   HERPES GENITALIS 04/03/2008   Hypothyroidism 04/03/2008   Vitamin D deficiency 04/03/2008   Hyperlipidemia 03/28/2009   Familial tremor 03/28/2009   BENIGN POSITIONAL VERTIGO 04/24/2010   HYPERTENSION, BENIGN 04/03/2008   GERD 04/03/2008   HIATAL HERNIA 04/03/2008   DIVERTICULOSIS OF COLON 04/03/2008   CKD (chronic kidney disease) stage 3, GFR 30-59 ml/min (HCC) 04/03/2008   OSTEOARTHRITIS 04/03/2008   OSTEOPENIA 04/03/2008   EDEMA 04/03/2008   Migraine headache without aura 04/03/2008   Bilateral plantar fasciitis 08/09/2014   Osteoarthritis of both ankles 08/09/2014   Posterior vitreous detachment of left eye 05/20/2016   Trigger middle finger of right hand 12/01/2015   S/P laparoscopic appendectomy 12/13/2011   OAB (overactive bladder) 05/30/2017   Night sweats 12/13/2011   Knee pain 08/23/2011   Herpes simplex type II infection 09/23/2011   Benign essential tremor 04/18/2017   Frequent falls 09/21/2018   Depression with anxiety 11/29/2018   RLS (restless legs syndrome) 11/29/2018   Gait instability 06/05/2019   Sacroiliac joint pain 12/28/2018   Lumbar facet joint pain 12/28/2018   Encounter for long-term use of opiate  analgesic 12/28/2018   Closed compression fracture of L1 lumbar vertebra, initial encounter (HCC) 12/28/2018   Age-related osteoporosis with current pathological fracture 12/28/2018   Lumbar foraminal stenosis 12/28/2018   Bradykinesia 05/22/2020   Heart failure with preserved ejection fraction (HCC) 09/24/2020   Resolved Ambulatory Problems    Diagnosis Date Noted   ANXIETY DEPRESSION 11/20/2008   LEG CRAMPS 01/01/2009   NAUSEA 04/03/2008   EPIGASTRIC PAIN 04/03/2008   Anxiety 08/23/2011   Laceration of scalp 09/21/2018   Wrist fracture, closed, right, initial encounter 12/13/2018   Past Medical History:  Diagnosis Date   Allergy    Depression    Hypertension        Review of Systems See HPI.     Objective:   Physical Exam Musculoskeletal:     Comments: In wheelchair not able to bear weight.  Swollen and tender left lateral malleolus.  Any palpation around lateral ankle very painful.  No warmth.  Pain with any ROM.   Able to passively move left hip without significant pain.  Bruising up and down left side of body.  Tenderness over left hip and down left leg.   Neurological:     General: No focal deficit present.     Mental Status: She is oriented to person, place, and time.          Assessment & Plan:  Marland KitchenMarland KitchenMarcee was seen today for ankle injury.  Diagnoses and all orders for this visit:  Injury of left ankle, subsequent  encounter -     DG Ankle Complete Left; Future -     DG Knee Complete 4 Views Left; Future  Left hip pain -     DG Hip Unilat W OR W/O Pelvis Min 4 Views Left; Future  Reassured patient that imaging today confirmed no acute fractures. She has a bad contusion and left ankle sprain. Placed in cam boot today. Continue to keep foot elevated and iced.  Follow up with Dr. Karie Schwalbe in 2 weeks.  Consider tramadol but concerned this would increase her fall risk.  Continue tylenol and ibuprofen. Stay off foot for the next 2 weeks.

## 2021-04-20 ENCOUNTER — Encounter: Payer: Self-pay | Admitting: Physician Assistant

## 2021-04-20 DIAGNOSIS — W19XXXA Unspecified fall, initial encounter: Secondary | ICD-10-CM | POA: Insufficient documentation

## 2021-05-01 ENCOUNTER — Other Ambulatory Visit: Payer: Self-pay

## 2021-05-01 ENCOUNTER — Ambulatory Visit (INDEPENDENT_AMBULATORY_CARE_PROVIDER_SITE_OTHER): Payer: Medicare Other | Admitting: Sports Medicine

## 2021-05-01 DIAGNOSIS — S93402D Sprain of unspecified ligament of left ankle, subsequent encounter: Secondary | ICD-10-CM | POA: Diagnosis not present

## 2021-05-01 MED ORDER — MELOXICAM 7.5 MG PO TABS: ORAL_TABLET | ORAL | 0 refills | Status: DC

## 2021-05-01 NOTE — Assessment & Plan Note (Signed)
Nancy Wilson is a pleasant 80 year old female, approximately 1 week ago she injured her left ankle, she saw her PCP, x-rays were done, no fractures noted, only degenerative changes, she was placed in a boot, has improved a bit. Continue boot for 2-3 more weeks, adding low-dose meloxicam (CKD), we can get additional or advanced imaging at the follow-up if insufficiently better.  She was having some hip pain which has for the most part resolved.

## 2021-05-01 NOTE — Progress Notes (Signed)
    Procedures performed today:    None.  Independent interpretation of notes and tests performed by another provider:   X-rays personally reviewed, mostly degenerative changes in the hips and ankle, nothing acute or fracture.  Brief History, Exam, Impression, and Recommendations:    Sprain of left ankle Nancy Wilson is a pleasant 80 year old female, approximately 1 week ago she injured her left ankle, she saw her PCP, x-rays were done, no fractures noted, only degenerative changes, she was placed in a boot, has improved a bit. Continue boot for 2-3 more weeks, adding low-dose meloxicam (CKD), we can get additional or advanced imaging at the follow-up if insufficiently better.  She was having some hip pain which has for the most part resolved.    ___________________________________________ Ihor Austin. Benjamin Stain, M.D., ABFM., CAQSM. Primary Care and Sports Medicine [facility]  Adjunct Instructor of Family Medicine  [employer]

## 2021-05-22 ENCOUNTER — Ambulatory Visit (INDEPENDENT_AMBULATORY_CARE_PROVIDER_SITE_OTHER): Payer: Medicare Other | Admitting: Sports Medicine

## 2021-05-22 ENCOUNTER — Other Ambulatory Visit: Payer: Self-pay

## 2021-05-22 DIAGNOSIS — S93402D Sprain of unspecified ligament of left ankle, subsequent encounter: Secondary | ICD-10-CM | POA: Diagnosis not present

## 2021-05-22 NOTE — Progress Notes (Signed)
    Procedures performed today:    None.  Independent interpretation of notes and tests performed by another provider:   None.  Brief History, Exam, Impression, and Recommendations:    Sprain of left ankle This pleasant 80 year old female returns, she is approximately 4 weeks post left ankle injury, she saw her PCP, x-rays done, no fractures noted, she only had some degenerative changes, she is doing good about better, she still has significant pain, swelling, mostly at the lateral ankle mortise, the boot is significantly too big so we will replace it with a small boot, she will continue the small boot for 2 weeks. I am going to add home health physical therapy, continue low-dose meloxicam due to CKD, and return to see me in approximately 6 weeks.    ___________________________________________ Ihor Austin. Benjamin Stain, M.D., ABFM., CAQSM. Primary Care and Sports Medicine [facility]  Adjunct Instructor of Family Medicine  [employer]

## 2021-05-22 NOTE — Assessment & Plan Note (Signed)
This pleasant 80 year old female returns, she is approximately 4 weeks post left ankle injury, she saw her PCP, x-rays done, no fractures noted, she only had some degenerative changes, she is doing good about better, she still has significant pain, swelling, mostly at the lateral ankle mortise, the boot is significantly too big so we will replace it with a small boot, she will continue the small boot for 2 weeks. I am going to add home health physical therapy, continue low-dose meloxicam due to CKD, and return to see me in approximately 6 weeks.

## 2021-05-28 ENCOUNTER — Other Ambulatory Visit: Payer: Self-pay | Admitting: Sports Medicine

## 2021-05-28 DIAGNOSIS — S93402D Sprain of unspecified ligament of left ankle, subsequent encounter: Secondary | ICD-10-CM

## 2021-05-29 DIAGNOSIS — H8110 Benign paroxysmal vertigo, unspecified ear: Secondary | ICD-10-CM | POA: Diagnosis not present

## 2021-05-29 DIAGNOSIS — I129 Hypertensive chronic kidney disease with stage 1 through stage 4 chronic kidney disease, or unspecified chronic kidney disease: Secondary | ICD-10-CM | POA: Diagnosis not present

## 2021-05-29 DIAGNOSIS — S93402D Sprain of unspecified ligament of left ankle, subsequent encounter: Secondary | ICD-10-CM | POA: Diagnosis not present

## 2021-05-29 DIAGNOSIS — M150 Primary generalized (osteo)arthritis: Secondary | ICD-10-CM | POA: Diagnosis not present

## 2021-05-29 DIAGNOSIS — N183 Chronic kidney disease, stage 3 unspecified: Secondary | ICD-10-CM | POA: Diagnosis not present

## 2021-05-29 DIAGNOSIS — G250 Essential tremor: Secondary | ICD-10-CM | POA: Diagnosis not present

## 2021-05-29 DIAGNOSIS — E559 Vitamin D deficiency, unspecified: Secondary | ICD-10-CM | POA: Diagnosis not present

## 2021-05-29 DIAGNOSIS — R296 Repeated falls: Secondary | ICD-10-CM | POA: Diagnosis not present

## 2021-05-29 DIAGNOSIS — N3281 Overactive bladder: Secondary | ICD-10-CM | POA: Diagnosis not present

## 2021-06-03 DIAGNOSIS — I129 Hypertensive chronic kidney disease with stage 1 through stage 4 chronic kidney disease, or unspecified chronic kidney disease: Secondary | ICD-10-CM | POA: Diagnosis not present

## 2021-06-03 DIAGNOSIS — R296 Repeated falls: Secondary | ICD-10-CM | POA: Diagnosis not present

## 2021-06-03 DIAGNOSIS — N3281 Overactive bladder: Secondary | ICD-10-CM | POA: Diagnosis not present

## 2021-06-03 DIAGNOSIS — G250 Essential tremor: Secondary | ICD-10-CM | POA: Diagnosis not present

## 2021-06-03 DIAGNOSIS — N183 Chronic kidney disease, stage 3 unspecified: Secondary | ICD-10-CM | POA: Diagnosis not present

## 2021-06-03 DIAGNOSIS — E559 Vitamin D deficiency, unspecified: Secondary | ICD-10-CM | POA: Diagnosis not present

## 2021-06-03 DIAGNOSIS — S93402D Sprain of unspecified ligament of left ankle, subsequent encounter: Secondary | ICD-10-CM | POA: Diagnosis not present

## 2021-06-03 DIAGNOSIS — H8110 Benign paroxysmal vertigo, unspecified ear: Secondary | ICD-10-CM | POA: Diagnosis not present

## 2021-06-03 DIAGNOSIS — M150 Primary generalized (osteo)arthritis: Secondary | ICD-10-CM | POA: Diagnosis not present

## 2021-06-05 DIAGNOSIS — M150 Primary generalized (osteo)arthritis: Secondary | ICD-10-CM | POA: Diagnosis not present

## 2021-06-05 DIAGNOSIS — N183 Chronic kidney disease, stage 3 unspecified: Secondary | ICD-10-CM | POA: Diagnosis not present

## 2021-06-05 DIAGNOSIS — N3281 Overactive bladder: Secondary | ICD-10-CM | POA: Diagnosis not present

## 2021-06-05 DIAGNOSIS — G250 Essential tremor: Secondary | ICD-10-CM | POA: Diagnosis not present

## 2021-06-05 DIAGNOSIS — R296 Repeated falls: Secondary | ICD-10-CM | POA: Diagnosis not present

## 2021-06-05 DIAGNOSIS — I129 Hypertensive chronic kidney disease with stage 1 through stage 4 chronic kidney disease, or unspecified chronic kidney disease: Secondary | ICD-10-CM | POA: Diagnosis not present

## 2021-06-05 DIAGNOSIS — H8110 Benign paroxysmal vertigo, unspecified ear: Secondary | ICD-10-CM | POA: Diagnosis not present

## 2021-06-05 DIAGNOSIS — E559 Vitamin D deficiency, unspecified: Secondary | ICD-10-CM | POA: Diagnosis not present

## 2021-06-05 DIAGNOSIS — S93402D Sprain of unspecified ligament of left ankle, subsequent encounter: Secondary | ICD-10-CM | POA: Diagnosis not present

## 2021-06-06 ENCOUNTER — Other Ambulatory Visit: Payer: Self-pay | Admitting: Family Medicine

## 2021-06-06 DIAGNOSIS — E785 Hyperlipidemia, unspecified: Secondary | ICD-10-CM

## 2021-06-06 DIAGNOSIS — F341 Dysthymic disorder: Secondary | ICD-10-CM

## 2021-06-06 DIAGNOSIS — I10 Essential (primary) hypertension: Secondary | ICD-10-CM

## 2021-06-08 ENCOUNTER — Other Ambulatory Visit: Payer: Self-pay | Admitting: Family Medicine

## 2021-06-08 DIAGNOSIS — G250 Essential tremor: Secondary | ICD-10-CM | POA: Diagnosis not present

## 2021-06-08 DIAGNOSIS — R296 Repeated falls: Secondary | ICD-10-CM | POA: Diagnosis not present

## 2021-06-08 DIAGNOSIS — S93402D Sprain of unspecified ligament of left ankle, subsequent encounter: Secondary | ICD-10-CM | POA: Diagnosis not present

## 2021-06-08 DIAGNOSIS — I129 Hypertensive chronic kidney disease with stage 1 through stage 4 chronic kidney disease, or unspecified chronic kidney disease: Secondary | ICD-10-CM | POA: Diagnosis not present

## 2021-06-08 DIAGNOSIS — M150 Primary generalized (osteo)arthritis: Secondary | ICD-10-CM | POA: Diagnosis not present

## 2021-06-08 DIAGNOSIS — N3281 Overactive bladder: Secondary | ICD-10-CM | POA: Diagnosis not present

## 2021-06-08 DIAGNOSIS — N183 Chronic kidney disease, stage 3 unspecified: Secondary | ICD-10-CM | POA: Diagnosis not present

## 2021-06-08 DIAGNOSIS — E559 Vitamin D deficiency, unspecified: Secondary | ICD-10-CM | POA: Diagnosis not present

## 2021-06-08 DIAGNOSIS — H8110 Benign paroxysmal vertigo, unspecified ear: Secondary | ICD-10-CM | POA: Diagnosis not present

## 2021-06-11 DIAGNOSIS — R296 Repeated falls: Secondary | ICD-10-CM | POA: Diagnosis not present

## 2021-06-11 DIAGNOSIS — I129 Hypertensive chronic kidney disease with stage 1 through stage 4 chronic kidney disease, or unspecified chronic kidney disease: Secondary | ICD-10-CM | POA: Diagnosis not present

## 2021-06-11 DIAGNOSIS — N183 Chronic kidney disease, stage 3 unspecified: Secondary | ICD-10-CM | POA: Diagnosis not present

## 2021-06-11 DIAGNOSIS — E559 Vitamin D deficiency, unspecified: Secondary | ICD-10-CM | POA: Diagnosis not present

## 2021-06-11 DIAGNOSIS — S93402D Sprain of unspecified ligament of left ankle, subsequent encounter: Secondary | ICD-10-CM | POA: Diagnosis not present

## 2021-06-11 DIAGNOSIS — H8110 Benign paroxysmal vertigo, unspecified ear: Secondary | ICD-10-CM | POA: Diagnosis not present

## 2021-06-11 DIAGNOSIS — N3281 Overactive bladder: Secondary | ICD-10-CM | POA: Diagnosis not present

## 2021-06-11 DIAGNOSIS — M150 Primary generalized (osteo)arthritis: Secondary | ICD-10-CM | POA: Diagnosis not present

## 2021-06-11 DIAGNOSIS — G250 Essential tremor: Secondary | ICD-10-CM | POA: Diagnosis not present

## 2021-06-16 DIAGNOSIS — S93402D Sprain of unspecified ligament of left ankle, subsequent encounter: Secondary | ICD-10-CM | POA: Diagnosis not present

## 2021-06-16 DIAGNOSIS — N183 Chronic kidney disease, stage 3 unspecified: Secondary | ICD-10-CM | POA: Diagnosis not present

## 2021-06-16 DIAGNOSIS — M150 Primary generalized (osteo)arthritis: Secondary | ICD-10-CM | POA: Diagnosis not present

## 2021-06-16 DIAGNOSIS — I129 Hypertensive chronic kidney disease with stage 1 through stage 4 chronic kidney disease, or unspecified chronic kidney disease: Secondary | ICD-10-CM | POA: Diagnosis not present

## 2021-06-16 DIAGNOSIS — N3281 Overactive bladder: Secondary | ICD-10-CM | POA: Diagnosis not present

## 2021-06-16 DIAGNOSIS — E559 Vitamin D deficiency, unspecified: Secondary | ICD-10-CM | POA: Diagnosis not present

## 2021-06-16 DIAGNOSIS — R296 Repeated falls: Secondary | ICD-10-CM | POA: Diagnosis not present

## 2021-06-16 DIAGNOSIS — G250 Essential tremor: Secondary | ICD-10-CM | POA: Diagnosis not present

## 2021-06-16 DIAGNOSIS — H8110 Benign paroxysmal vertigo, unspecified ear: Secondary | ICD-10-CM | POA: Diagnosis not present

## 2021-06-17 ENCOUNTER — Other Ambulatory Visit: Payer: Self-pay | Admitting: Family Medicine

## 2021-06-18 DIAGNOSIS — E559 Vitamin D deficiency, unspecified: Secondary | ICD-10-CM | POA: Diagnosis not present

## 2021-06-18 DIAGNOSIS — G250 Essential tremor: Secondary | ICD-10-CM | POA: Diagnosis not present

## 2021-06-18 DIAGNOSIS — M150 Primary generalized (osteo)arthritis: Secondary | ICD-10-CM | POA: Diagnosis not present

## 2021-06-18 DIAGNOSIS — R296 Repeated falls: Secondary | ICD-10-CM | POA: Diagnosis not present

## 2021-06-18 DIAGNOSIS — I129 Hypertensive chronic kidney disease with stage 1 through stage 4 chronic kidney disease, or unspecified chronic kidney disease: Secondary | ICD-10-CM | POA: Diagnosis not present

## 2021-06-18 DIAGNOSIS — S93402D Sprain of unspecified ligament of left ankle, subsequent encounter: Secondary | ICD-10-CM | POA: Diagnosis not present

## 2021-06-18 DIAGNOSIS — H8110 Benign paroxysmal vertigo, unspecified ear: Secondary | ICD-10-CM | POA: Diagnosis not present

## 2021-06-18 DIAGNOSIS — N183 Chronic kidney disease, stage 3 unspecified: Secondary | ICD-10-CM | POA: Diagnosis not present

## 2021-06-18 DIAGNOSIS — N3281 Overactive bladder: Secondary | ICD-10-CM | POA: Diagnosis not present

## 2021-06-23 DIAGNOSIS — N3281 Overactive bladder: Secondary | ICD-10-CM | POA: Diagnosis not present

## 2021-06-23 DIAGNOSIS — H8110 Benign paroxysmal vertigo, unspecified ear: Secondary | ICD-10-CM | POA: Diagnosis not present

## 2021-06-23 DIAGNOSIS — N183 Chronic kidney disease, stage 3 unspecified: Secondary | ICD-10-CM | POA: Diagnosis not present

## 2021-06-23 DIAGNOSIS — E559 Vitamin D deficiency, unspecified: Secondary | ICD-10-CM | POA: Diagnosis not present

## 2021-06-23 DIAGNOSIS — G250 Essential tremor: Secondary | ICD-10-CM | POA: Diagnosis not present

## 2021-06-23 DIAGNOSIS — S93402D Sprain of unspecified ligament of left ankle, subsequent encounter: Secondary | ICD-10-CM | POA: Diagnosis not present

## 2021-06-23 DIAGNOSIS — I129 Hypertensive chronic kidney disease with stage 1 through stage 4 chronic kidney disease, or unspecified chronic kidney disease: Secondary | ICD-10-CM | POA: Diagnosis not present

## 2021-06-23 DIAGNOSIS — R296 Repeated falls: Secondary | ICD-10-CM | POA: Diagnosis not present

## 2021-06-23 DIAGNOSIS — M150 Primary generalized (osteo)arthritis: Secondary | ICD-10-CM | POA: Diagnosis not present

## 2021-06-25 DIAGNOSIS — N3281 Overactive bladder: Secondary | ICD-10-CM | POA: Diagnosis not present

## 2021-06-25 DIAGNOSIS — G250 Essential tremor: Secondary | ICD-10-CM | POA: Diagnosis not present

## 2021-06-25 DIAGNOSIS — S93402D Sprain of unspecified ligament of left ankle, subsequent encounter: Secondary | ICD-10-CM | POA: Diagnosis not present

## 2021-06-25 DIAGNOSIS — I129 Hypertensive chronic kidney disease with stage 1 through stage 4 chronic kidney disease, or unspecified chronic kidney disease: Secondary | ICD-10-CM | POA: Diagnosis not present

## 2021-06-25 DIAGNOSIS — M150 Primary generalized (osteo)arthritis: Secondary | ICD-10-CM | POA: Diagnosis not present

## 2021-06-25 DIAGNOSIS — R296 Repeated falls: Secondary | ICD-10-CM | POA: Diagnosis not present

## 2021-06-25 DIAGNOSIS — E559 Vitamin D deficiency, unspecified: Secondary | ICD-10-CM | POA: Diagnosis not present

## 2021-06-25 DIAGNOSIS — H8110 Benign paroxysmal vertigo, unspecified ear: Secondary | ICD-10-CM | POA: Diagnosis not present

## 2021-06-25 DIAGNOSIS — N183 Chronic kidney disease, stage 3 unspecified: Secondary | ICD-10-CM | POA: Diagnosis not present

## 2021-06-30 DIAGNOSIS — R296 Repeated falls: Secondary | ICD-10-CM | POA: Diagnosis not present

## 2021-06-30 DIAGNOSIS — S93402D Sprain of unspecified ligament of left ankle, subsequent encounter: Secondary | ICD-10-CM | POA: Diagnosis not present

## 2021-06-30 DIAGNOSIS — I129 Hypertensive chronic kidney disease with stage 1 through stage 4 chronic kidney disease, or unspecified chronic kidney disease: Secondary | ICD-10-CM | POA: Diagnosis not present

## 2021-06-30 DIAGNOSIS — E559 Vitamin D deficiency, unspecified: Secondary | ICD-10-CM | POA: Diagnosis not present

## 2021-06-30 DIAGNOSIS — G250 Essential tremor: Secondary | ICD-10-CM | POA: Diagnosis not present

## 2021-06-30 DIAGNOSIS — N3281 Overactive bladder: Secondary | ICD-10-CM | POA: Diagnosis not present

## 2021-06-30 DIAGNOSIS — N183 Chronic kidney disease, stage 3 unspecified: Secondary | ICD-10-CM | POA: Diagnosis not present

## 2021-06-30 DIAGNOSIS — H8110 Benign paroxysmal vertigo, unspecified ear: Secondary | ICD-10-CM | POA: Diagnosis not present

## 2021-06-30 DIAGNOSIS — M150 Primary generalized (osteo)arthritis: Secondary | ICD-10-CM | POA: Diagnosis not present

## 2021-07-03 ENCOUNTER — Other Ambulatory Visit: Payer: Self-pay

## 2021-07-03 ENCOUNTER — Ambulatory Visit (INDEPENDENT_AMBULATORY_CARE_PROVIDER_SITE_OTHER): Payer: Medicare Other | Admitting: Sports Medicine

## 2021-07-03 DIAGNOSIS — S93402D Sprain of unspecified ligament of left ankle, subsequent encounter: Secondary | ICD-10-CM | POA: Diagnosis not present

## 2021-07-03 DIAGNOSIS — Z23 Encounter for immunization: Secondary | ICD-10-CM

## 2021-07-03 NOTE — Assessment & Plan Note (Signed)
Nancy Wilson returns, she is a very pleasant 80 year old female, she inverted her ankle approximately 10 weeks ago, x-rays showed no fractures, she has been doing some therapy at home, low-dose meloxicam, she returns today doing significantly better, there is for the most part no pain, good motion, good strength, she is able to ambulate, continue therapy, discontinue anti-inflammatories, return as needed.

## 2021-07-03 NOTE — Progress Notes (Signed)
    Procedures performed today:    None.  Independent interpretation of notes and tests performed by another provider:   None.  Brief History, Exam, Impression, and Recommendations:    Sprain of left ankle Nancy Wilson returns, she is a very pleasant 80 year old female, she inverted her ankle approximately 10 weeks ago, x-rays showed no fractures, she has been doing some therapy at home, low-dose meloxicam, she returns today doing significantly better, there is for the most part no pain, good motion, good strength, she is able to ambulate, continue therapy, discontinue anti-inflammatories, return as needed.    ___________________________________________ Ihor Austin. Benjamin Stain, M.D., ABFM., CAQSM. Primary Care and Sports Medicine [facility]  Adjunct Instructor of Family Medicine  [employer]

## 2021-07-03 NOTE — Addendum Note (Signed)
Addended by: Gaynelle Arabian on: 07/03/2021 10:46 AM   Modules accepted: Orders

## 2021-07-09 ENCOUNTER — Emergency Department (INDEPENDENT_AMBULATORY_CARE_PROVIDER_SITE_OTHER): Payer: Medicare Other

## 2021-07-09 ENCOUNTER — Other Ambulatory Visit: Payer: Self-pay

## 2021-07-09 ENCOUNTER — Emergency Department
Admission: EM | Admit: 2021-07-09 | Discharge: 2021-07-09 | Disposition: A | Discharge status: Home / Self Care | Payer: Medicare Other | Source: Home / Self Care | Attending: Family Medicine | Admitting: Family Medicine

## 2021-07-09 DIAGNOSIS — R0781 Pleurodynia: Secondary | ICD-10-CM

## 2021-07-09 DIAGNOSIS — R296 Repeated falls: Secondary | ICD-10-CM

## 2021-07-09 DIAGNOSIS — S299XXA Unspecified injury of thorax, initial encounter: Secondary | ICD-10-CM | POA: Diagnosis not present

## 2021-07-09 MED ORDER — TRAMADOL HCL 50 MG PO TABS: 50.0000 mg | ORAL_TABLET | Freq: Two times a day (BID) | ORAL | 0 refills | Status: DC | PRN

## 2021-07-09 NOTE — ED Triage Notes (Signed)
Pt present a fall two days ago on her right side. Pt states that she cannot take deep breath, cough or sneeze without it hurting. Pt state that its hard for stand up, walk and laying down without help.

## 2021-07-09 NOTE — ED Provider Notes (Signed)
[facility]    CSN: [id number] Arrival date & time: 07/09/21  1135      History   Chief Complaint Chief Complaint  Patient presents with   Fall    HPI Nancy Wilson is a 80 y.o. female.   HPI  Patient is a 80 year old woman with gait instability and multiple falls.  She is also known to have osteoporosis and takes Fosamax.  She has had osteoporosis fractures in the past.  She is here today for another fall.  She fell on Tuesday.  She fell over towards her right side.  She has pain in her right lower ribs underneath her armpit.  She has pain with deep breaths and with movement.  She has difficulty with ambulation and her husband is helping her with dressing and movement.  No fever or chills.  No cough.  Denies hitting her head.  Denies pain in her neck or low back, hip or legs.  Past Medical History:  Diagnosis Date   Allergy    Depression    long standing   Hyperlipidemia    Hypertension     Patient Active Problem List   Diagnosis Date Noted   Heart failure with preserved ejection fraction (HCC) 09/24/2020   Bradykinesia 05/22/2020   Gait instability 06/05/2019   Sacroiliac joint pain 12/28/2018   Lumbar facet joint pain 12/28/2018   Encounter for long-term use of opiate analgesic 12/28/2018   Closed compression fracture of L1 lumbar vertebra, initial encounter (HCC) 12/28/2018   Age-related osteoporosis with current pathological fracture 12/28/2018   Lumbar foraminal stenosis 12/28/2018   Depression with anxiety 11/29/2018   RLS (restless legs syndrome) 11/29/2018   Frequent falls 09/21/2018   OAB (overactive bladder) 05/30/2017   Benign essential tremor 04/18/2017   Posterior vitreous detachment of left eye 05/20/2016   Trigger middle finger of right hand 12/01/2015   Bilateral plantar fasciitis 08/09/2014   Osteoarthritis of both ankles 08/09/2014   S/P laparoscopic appendectomy 12/13/2011   Night sweats 12/13/2011   Herpes simplex type II  infection 09/23/2011   BENIGN POSITIONAL VERTIGO 04/24/2010   Hyperlipidemia 03/28/2009   Hypothyroidism 04/03/2008   Vitamin D deficiency 04/03/2008   HYPERTENSION, BENIGN 04/03/2008   GERD 04/03/2008   HIATAL HERNIA 04/03/2008   DIVERTICULOSIS OF COLON 04/03/2008   CKD (chronic kidney disease) stage 3, GFR 30-59 ml/min (HCC) 04/03/2008   OSTEOARTHRITIS 04/03/2008   OSTEOPENIA 04/03/2008   EDEMA 04/03/2008   Migraine headache without aura 04/03/2008    Past Surgical History:  Procedure Laterality Date   carpel tunnel release, both hands  1980's   DILATION AND CURETTAGE OF UTERUS     FRACTURE SURGERY     fell and broke right arm   GASTROSCOPY  11-06   trigger finger release, left finger     TUBAL LIGATION     bilateral    OB History   No obstetric history on file.      Home Medications    Prior to Admission medications   Medication Sig Start Date End Date Taking? Authorizing Provider  acyclovir (ZOVIRAX) 400 MG tablet TAKE 1 TABLET BY MOUTH AT  BEDTIME 03/05/21   Agapito Games, MD  alendronate (FOSAMAX) 70 MG tablet TAKE 1 TABLET BY MOUTH  WEEKLY WITH 8 OZ OF PLAIN  WATER 30 MINUTES BEFORE  FIRST FOOD, DRINK OR MEDS.  STAY UPRIGHT FOR 30 MINS 10/01/20   Agapito Games, MD  amLODipine (NORVASC) 10 MG tablet TAKE 1 TABLET  BY MOUTH  DAILY 08/20/20   Agapito Games, MD  ASPIRIN 81 PO Take by mouth.    [provider]  atorvastatin (LIPITOR) 40 MG tablet TAKE 1 TABLET BY MOUTH  DAILY 06/11/21   Agapito Games, MD  buPROPion (WELLBUTRIN XL) 300 MG 24 hr tablet TAKE 1 TABLET BY MOUTH  DAILY 06/11/21   Agapito Games, MD  carbidopa-levodopa (SINEMET IR) 25-100 MG tablet TAKE 1 TABLET BY MOUTH  DAILY AT BEDTIME 01/03/20   Agapito Games, MD  Cholecalciferol (VITAMIN D3) 2000 units capsule Take 2,000 Units by mouth daily.    [provider]  diclofenac Sodium (VOLTAREN) 1 % GEL Apply topically. 02/25/21   [provider]  eletriptan (RELPAX) 20 MG tablet Take 1 tablet (20 mg total) by mouth as needed for migraine or headache. May repeat in 2 hours if headache persists or recurs. 08/19/20   Agapito Games, MD  furosemide (LASIX) 20 MG tablet TAKE 1 TABLET BY MOUTH  DAILY AS NEEDED 06/08/21   Agapito Games, MD  meloxicam (MOBIC) 7.5 MG tablet 1 TAB IN THE MORNING WITH BREAKFAST FOR 2 WEEKS AND THEN STOP 06/14/21   Monica Becton, MD  omeprazole (PRILOSEC) 40 MG capsule Take 1 capsule (40 mg total) by mouth daily. 09/26/20   Agapito Games, MD  oxybutynin (DITROPAN) 5 MG tablet TAKE 1 TABLET BY MOUTH  TWICE DAILY 09/11/20   Agapito Games, MD  potassium chloride (KLOR-CON) 10 MEQ tablet Take 1 tablet (10 mEq total) by mouth daily. With furosemide 09/24/20   Agapito Games, MD  promethazine (PHENERGAN) 25 MG tablet Take 1 tablet (25 mg total) by mouth every 8 (eight) hours as needed for nausea or vomiting. 06/05/19   Agapito Games, MD  propranolol (INDERAL) 40 MG tablet TAKE 1 TABLET BY MOUTH  TWICE DAILY 06/11/21   Agapito Games, MD  sertraline (ZOLOFT) 100 MG tablet TAKE 1 TABLET BY MOUTH  DAILY 06/18/21   Agapito Games, MD  traMADol (ULTRAM) 50 MG tablet Take 1 tablet (50 mg total) by mouth every 12 (twelve) hours as needed for moderate pain. Maximum 6 tabs per day. 07/09/21   Eustace Moore, MD    Family History Family History  Problem Relation Age of Onset   Diabetes Mother    Hyperlipidemia Mother    Heart disease Mother 73   Hypertension Mother    Diabetes Father    Hyperlipidemia Father    Tremor Father        hand- started in his 79's   Hypertension Father    Tremor Cousin     Social History Social History   Tobacco Use   Smoking status: Never   Smokeless tobacco: Never  Vaping Use   Vaping Use: Never used  Substance Use Topics   Alcohol use: No    Alcohol/week: 0.0 standard drinks   Drug use: No     Allergies    Hydrocodone-acetaminophen, Codeine, Cymbalta [duloxetine hcl], Doxycycline, Effexor [venlafaxine], Guaifenesin, Meperidine, Meperidine hcl, Metronidazole, Morphine, Penicillins, Prednisone, Propoxyphene, Prozac [fluoxetine hcl], and Ropinirole   Review of Systems Review of Systems See HPI  Physical Exam Triage Vital Signs ED Triage Vitals  Enc Vitals Group     BP 07/09/21 1149 118/79     Pulse Rate 07/09/21 1149 72     Resp 07/09/21 1149 18     Temp 07/09/21 1149 97.9 F (36.6 C)     Temp  Source 07/09/21 1149 Oral     SpO2 07/09/21 1149 96 %     Weight --      Height --      Head Circumference --      Peak Flow --      Pain Score 07/09/21 1147 9     Pain Loc --      Pain Edu? --      Excl. in GC? --    No data found.  Updated Vital Signs BP 118/79 (BP Location: Right Arm)   Pulse 72   Temp 97.9 F (36.6 C) (Oral)   Resp 18   SpO2 96%      Physical Exam Constitutional:      General: She is not in acute distress.    Appearance: She is well-developed.     Comments: In wheelchair.  Appears uncomfortable  HENT:     Head: Normocephalic and atraumatic.     Mouth/Throat:     Comments: Is wearing mask Eyes:     Conjunctiva/sclera: Conjunctivae normal.     Pupils: Pupils are equal, round, and reactive to light.  Cardiovascular:     Rate and Rhythm: Normal rate and regular rhythm.     Heart sounds: Normal heart sounds.  Pulmonary:     Effort: Pulmonary effort is normal. No respiratory distress.     Breath sounds: No wheezing.     Comments: Tenderness of the ribs in the midaxillary line at the lower border.  No palpable defect.  No crepitus Chest:     Chest wall: Tenderness present.  Abdominal:     General: There is no distension.     Palpations: Abdomen is soft.  Musculoskeletal:        General: Normal range of motion.     Cervical back: Normal range of motion.  Skin:    General: Skin is warm and dry.  Neurological:     Mental Status: She is alert.      Gait: Gait abnormal.  Psychiatric:        Behavior: Behavior normal.     UC Treatments / Results  Labs (all labs ordered are listed, but only abnormal results are displayed) Labs Reviewed - No data to display  EKG   Radiology DG Ribs Unilateral W/Chest Right  Result Date: 07/09/2021 CLINICAL DATA:  Fall, hit right ribs some on walker EXAM: RIGHT RIBS AND CHEST - 3+ VIEW COMPARISON:  07/11/2014 FINDINGS: No fracture or other bone lesions are seen involving the ribs. There is no evidence of pneumothorax or pleural effusion. Both lungs are clear. Heart size and mediastinal contours are within normal limits. IMPRESSION: No displaced fractures or other radiographic abnormality of the right ribs. Electronically Signed   By: Jearld Lesch M.D.   On: 07/09/2021 12:47    Procedures Procedures (including critical care time)  Medications Ordered in UC Medications - No data to display  Initial Impression / Assessment and Plan / UC Course  I have reviewed the triage vital signs and the nursing notes.  Pertinent labs & imaging results that were available during my care of the patient were reviewed by me and considered in my medical decision making (see chart for details).     Rib films are negative.  No fracture.  Discussed rib contusion.  Follow-up with primary care.  Prevention of pneumonia emphasized Final Clinical Impressions(s) / UC Diagnoses   Final diagnoses:  Rib pain on right side  Frequent falls     Discharge  Instructions      No broken bones Get plenty of rest. Take tramadol as needed for pain Make sure you take deep breaths to prevent pneumonia Follow-up with your doctor if not improving by next week     ED Prescriptions     Medication Sig Dispense Auth. Provider   traMADol (ULTRAM) 50 MG tablet Take 1 tablet (50 mg total) by mouth every 12 (twelve) hours as needed for moderate pain. Maximum 6 tabs per day. 20 tablet Eustace Moore, MD      I have  reviewed the PDMP during this encounter.   Eustace Moore, MD 07/09/21 [phone]

## 2021-07-09 NOTE — Discharge Instructions (Signed)
No broken bones Get plenty of rest. Take tramadol as needed for pain Make sure you take deep breaths to prevent pneumonia Follow-up with your doctor if not improving by next week

## 2021-07-15 ENCOUNTER — Ambulatory Visit (INDEPENDENT_AMBULATORY_CARE_PROVIDER_SITE_OTHER): Payer: Medicare Other | Admitting: Medical-Surgical

## 2021-07-15 ENCOUNTER — Encounter: Payer: Self-pay | Admitting: Medical-Surgical

## 2021-07-15 VITALS — BP 157/81 | HR 73 | Resp 20 | Ht 59.0 in

## 2021-07-15 DIAGNOSIS — W19XXXD Unspecified fall, subsequent encounter: Secondary | ICD-10-CM

## 2021-07-15 DIAGNOSIS — Y92009 Unspecified place in unspecified non-institutional (private) residence as the place of occurrence of the external cause: Secondary | ICD-10-CM

## 2021-07-15 DIAGNOSIS — S20211D Contusion of right front wall of thorax, subsequent encounter: Secondary | ICD-10-CM | POA: Diagnosis not present

## 2021-07-15 MED ORDER — LIDOCAINE 5 % EX PTCH: 1.0000 | MEDICATED_PATCH | Freq: Two times a day (BID) | CUTANEOUS | 2 refills | Status: DC

## 2021-07-15 MED ORDER — BACLOFEN 5 MG PO TABS: 5.0000 mg | ORAL_TABLET | Freq: Every evening | ORAL | 0 refills | Status: DC | PRN

## 2021-07-15 MED ORDER — TRAMADOL HCL 50 MG PO TABS: 100.0000 mg | ORAL_TABLET | Freq: Two times a day (BID) | ORAL | 0 refills | Status: AC | PRN

## 2021-07-15 NOTE — Patient Instructions (Signed)
Take 100mg  Tramadol 2 times daily as needed Take Tylenol 650mg  with the Tramadol Use Baclofen 5mg  at bedtime to help with pain and sleep Use Lidocaine patches- place one over the most painful spot and leave in place for 12 hours. Remove and leave off for 12 hours before replacing it with a new patch.

## 2021-07-15 NOTE — Progress Notes (Signed)
  HPI with pertinent ROS:   CC: Fall 1 week ago  HPI: Pleasant 80 year old female accompanied by her husband presenting today for evaluation after having a fall approximately 1 week ago.  Notes that when she fell, she hit her right ribs on the walker we will and is spending quite a bit of pain since then.  She was evaluated at a urgent care on 10/20 where they did x-rays and provided tramadol as needed.  Her x-rays were negative for any fractures and she was felt to have rib contusions.  She has been using tramadol with little relief of her pain.  Has not slept over the past 3 nights even though last night she took a 1-1/2 of the tramadol to try and get some relief.  Has not used any other medications or treatments for pain control.  Feels that she is having trouble taking a deep breath due to discomfort.  I reviewed the past medical history, family history, social history, surgical history, and allergies today and no changes were needed.  Please see the problem list section below in epic for further details.   Physical exam:   General: Well Developed, well nourished, and in no acute distress.  Neuro: Alert and oriented x3.  HEENT: Normocephalic, atraumatic.  Skin: Warm and dry.  Light bruising to the lateral right thoracic spine extending to just below the axilla. Cardiac: Regular rate and rhythm, no murmurs rubs or gallops, no lower extremity edema.  Respiratory: Clear to auscultation bilaterally. Not using accessory muscles, speaking in full sentences.  Impression and Recommendations:    1. Fall in home, subsequent encounter 2. Contusion of rib on right side, subsequent encounter Due to multiple allergies, we will stick with tramadol.  Increasing dose to 100 mg twice daily as needed.  Add 650 mg of Tylenol with each dose.  Sending in baclofen 5 mg for use at bedtime as needed to help with sleep and discomfort.  Recommend lidocaine patches to the affected area.  Discussed the nature of soft  tissue injuries and bruising.  This will likely hurt for the next several weeks but should gradually improve.  Reviewed the importance of taking deep breaths regularly to prevent pneumonia.  Return if symptoms worsen or fail to improve. ___________________________________________ Thayer Ohm, DNP, APRN, FNP-BC Primary Care and Sports Medicine [facility]

## 2021-07-20 ENCOUNTER — Telehealth: Payer: Self-pay

## 2021-07-20 NOTE — Telephone Encounter (Signed)
Medication: lidocaine (LIDODERM) 5 % Prior authorization submitted via CoverMyMeds on 07/20/2021 PA submission pending

## 2021-07-23 ENCOUNTER — Other Ambulatory Visit: Payer: Self-pay | Admitting: Family Medicine

## 2021-07-23 DIAGNOSIS — I10 Essential (primary) hypertension: Secondary | ICD-10-CM

## 2021-08-05 ENCOUNTER — Other Ambulatory Visit: Payer: Self-pay | Admitting: Family Medicine

## 2021-08-05 ENCOUNTER — Telehealth: Payer: Self-pay | Admitting: Family Medicine

## 2021-08-05 DIAGNOSIS — Z1231 Encounter for screening mammogram for malignant neoplasm of breast: Secondary | ICD-10-CM

## 2021-08-05 NOTE — Telephone Encounter (Signed)
PT called and states that Dr. Linford Arnold referred her to a Neurologist in [facility] and she needs to talk to her assistant to discuss the xray or scans she needs done. Please call patient

## 2021-08-06 ENCOUNTER — Other Ambulatory Visit: Payer: Self-pay | Admitting: Medical-Surgical

## 2021-08-12 ENCOUNTER — Other Ambulatory Visit: Payer: Self-pay | Admitting: Family Medicine

## 2021-08-13 ENCOUNTER — Other Ambulatory Visit: Payer: Self-pay | Admitting: Family Medicine

## 2021-08-13 DIAGNOSIS — N3281 Overactive bladder: Secondary | ICD-10-CM

## 2021-08-25 ENCOUNTER — Other Ambulatory Visit: Payer: Self-pay | Admitting: Medical-Surgical

## 2021-08-25 NOTE — Telephone Encounter (Signed)
[place] pt 

## 2021-08-26 ENCOUNTER — Ambulatory Visit (INDEPENDENT_AMBULATORY_CARE_PROVIDER_SITE_OTHER): Payer: Medicare Other

## 2021-08-26 ENCOUNTER — Other Ambulatory Visit: Payer: Self-pay

## 2021-08-26 DIAGNOSIS — Z780 Asymptomatic menopausal state: Secondary | ICD-10-CM | POA: Diagnosis not present

## 2021-08-26 DIAGNOSIS — Z0000 Encounter for general adult medical examination without abnormal findings: Secondary | ICD-10-CM

## 2021-08-26 DIAGNOSIS — Z1231 Encounter for screening mammogram for malignant neoplasm of breast: Secondary | ICD-10-CM

## 2021-08-27 ENCOUNTER — Encounter: Payer: Self-pay | Admitting: Family Medicine

## 2021-08-27 ENCOUNTER — Other Ambulatory Visit: Payer: Self-pay | Admitting: Family Medicine

## 2021-08-27 ENCOUNTER — Other Ambulatory Visit: Payer: Self-pay | Admitting: *Deleted

## 2021-08-27 DIAGNOSIS — E785 Hyperlipidemia, unspecified: Secondary | ICD-10-CM

## 2021-08-27 LAB — LIPID PANEL W/REFLEX DIRECT LDL
Cholesterol: 310 mg/dL — ABNORMAL HIGH (ref <200)
HDL: 58 mg/dL (ref >=50)
LDL Cholesterol (Calc): 227 mg/dL (calc) — ABNORMAL HIGH
Non-HDL Cholesterol (Calc): 252 mg/dL (calc) — ABNORMAL HIGH (ref <130)
Total CHOL/HDL Ratio: 5.3 (calc) — ABNORMAL HIGH (ref <5.0)
Triglycerides: 113 mg/dL (ref <150)

## 2021-08-27 LAB — COMPLETE METABOLIC PANEL WITH GFR
AG Ratio: 1.9 (calc) (ref 1.0–2.5)
ALT: 12 U/L (ref 6–29)
AST: 18 U/L (ref 10–35)
Albumin: 4.3 g/dL (ref 3.6–5.1)
Alkaline phosphatase (APISO): 119 U/L (ref 37–153)
BUN/Creatinine Ratio: 18 (calc) (ref 6–22)
BUN: 20 mg/dL (ref 7–25)
CO2: 23 mmol/L (ref 20–32)
Calcium: 9.3 mg/dL (ref 8.6–10.4)
Chloride: 105 mmol/L (ref 98–110)
Creat: 1.09 mg/dL — ABNORMAL HIGH (ref 0.60–1.00)
Globulin: 2.3 g/dL (calc) (ref 1.9–3.7)
Glucose, Bld: 97 mg/dL (ref 65–99)
Potassium: 4.8 mmol/L (ref 3.5–5.3)
Sodium: 139 mmol/L (ref 135–146)
Total Bilirubin: 0.5 mg/dL (ref 0.2–1.2)
Total Protein: 6.6 g/dL (ref 6.1–8.1)
eGFR: 52 mL/min/{1.73_m2} — ABNORMAL LOW (ref >=60)

## 2021-08-27 LAB — HEPATITIS C ANTIBODY
Hepatitis C Ab: NONREACTIVE
SIGNAL TO CUT-OFF: <0.02 (ref <1.00)

## 2021-08-27 LAB — TSH: TSH: 2.26 mIU/L (ref 0.40–4.50)

## 2021-08-27 MED ORDER — ATORVASTATIN CALCIUM 20 MG PO TABS: 40.0000 mg | ORAL_TABLET | Freq: Every day | ORAL | 3 refills | Status: DC

## 2021-08-27 NOTE — Progress Notes (Signed)
Please call patient. Normal mammogram.

## 2021-08-27 NOTE — Progress Notes (Signed)
Patient: Kidney function is stable.  In fact it looks better than it has in the last 2 years which is great.  It still shows some impairment but stable.  Liver function is normal.  Your thyroid looks great.  Cholesterol is really really high.  Are you taking your atorvastatin?  It is really important that you are taking that regularly to reduce your risk for heart attack and stroke especially since you have very high cholesterol.  If she is taking it then let me know and I will increase her dose if she is not taking it and have her get back on it and then lets recheck her levels again in about 8 weeks.

## 2021-08-27 NOTE — Progress Notes (Signed)
Call patient: Bone density shows a T score of -2.7 which is consistent with osteoporosis.   The current recommendation for osteoporosis treatment includes:   #1 calcium-total of 1200 mg of calcium daily.  If you eat a very calcium rich diet you may be able to obtain that without a supplement.  If not, then I recommend calcium 500 mg twice a day.  There are several products over-the-counter such as Caltrate D and Viactiv chews which are great options that contain calcium and vitamin D. #2 vitamin D-recommend 800 international units daily. #3 exercise-recommend 30 minutes of weightbearing exercise 3 days a week.  Resistance training ,such as doing bands and light weights, can be particularly helpful. #4 medication-if you are not currently on a bone builder, also called a bisphosphonate, then this has been shown to be very helpful in maintaining bone strength, preventing further thinning of the bones, and reducing your risk for fractures.  I would highly recommend that you consider starting 1 of these medications.  If you are okay with that then please let us know and we will send one to your pharmacy.  If you would like to discuss further we are happy to make an appointment for you so that we can go over options for treatment.

## 2021-08-28 NOTE — Telephone Encounter (Signed)
Medication: lidocaine (LIDODERM) 5 % Prior authorization re-submitted via CoverMyMeds on 08/28/2021 PA submission pending

## 2021-08-28 NOTE — Progress Notes (Signed)
Call patient: Negative for hepatitis C.  We just screened for this once in the lifetime.

## 2021-09-01 ENCOUNTER — Telehealth: Payer: Self-pay

## 2021-09-01 NOTE — Telephone Encounter (Signed)
Medication: atorvastatin (LIPITOR) 20 MG tablet Prior authorization submitted via CoverMyMeds on 09/01/2021 PA submission pending

## 2021-09-03 ENCOUNTER — Other Ambulatory Visit: Payer: Self-pay | Admitting: Family Medicine

## 2021-09-03 DIAGNOSIS — E785 Hyperlipidemia, unspecified: Secondary | ICD-10-CM

## 2021-09-03 MED ORDER — ATORVASTATIN CALCIUM 20 MG PO TABS: 20.0000 mg | ORAL_TABLET | Freq: Every day | ORAL | 3 refills | Status: DC

## 2021-09-07 NOTE — Progress Notes (Signed)
FW: ATORVASTATIN TAB 20MG  denied prior authorization for Received: 4 days ago Julian Hy, MD  [facility] Clinical Pool Please notify the patient that the atorvastatin 20 mg 2 tabs daily was denied so organ to change the prescription so it says 1 daily.  Or we can always go back up to the 40 mg dose but I did go ahead and send a new prescription for the 20 mg once daily.         Pt informed of change in RX.  Pt expressed understanding and is agreeable.  Agapito Games, CMA

## 2021-09-11 NOTE — Telephone Encounter (Signed)
Reason for denial:  Medicare allows [place] to cover a drug only when it is a Part D drug. A Part D drug is one that is used for a "medically accepted indication." A medically accepted indication means the use is approved by the FDA or the use is supported by one of the following accepted references: (1)  [facility] Formulary Service Drug Information (2) [employer] DRUGDEX Information System Lidocaine 5% patch is not FDA approved for your medical condition(s): Fall in home, subsequent encounter and contusion of rib on right side, subsequent encounter."

## 2021-09-11 NOTE — Telephone Encounter (Signed)
Medication: lidocaine (LIDODERM) 5 % Prior authorization determination received Medication has been denied Letter with reason for denial will be faxed to our office

## 2021-09-18 ENCOUNTER — Ambulatory Visit (INDEPENDENT_AMBULATORY_CARE_PROVIDER_SITE_OTHER): Payer: Medicare Other

## 2021-09-18 ENCOUNTER — Other Ambulatory Visit: Payer: Self-pay

## 2021-09-18 DIAGNOSIS — R590 Localized enlarged lymph nodes: Secondary | ICD-10-CM

## 2021-09-18 NOTE — Progress Notes (Signed)
Call pt: Nancy Wilson looks reassuring. No worrisome finding. Just keep an eye on it and if anything changes please let Nancy Wilson know.

## 2021-09-30 DIAGNOSIS — R296 Repeated falls: Secondary | ICD-10-CM | POA: Diagnosis not present

## 2021-09-30 DIAGNOSIS — Z79899 Other long term (current) drug therapy: Secondary | ICD-10-CM | POA: Diagnosis not present

## 2021-09-30 DIAGNOSIS — R258 Other abnormal involuntary movements: Secondary | ICD-10-CM | POA: Diagnosis not present

## 2021-10-07 DIAGNOSIS — H43812 Vitreous degeneration, left eye: Secondary | ICD-10-CM | POA: Diagnosis not present

## 2021-10-07 DIAGNOSIS — H11423 Conjunctival edema, bilateral: Secondary | ICD-10-CM | POA: Diagnosis not present

## 2021-10-07 DIAGNOSIS — H16213 Exposure keratoconjunctivitis, bilateral: Secondary | ICD-10-CM | POA: Diagnosis not present

## 2021-10-07 DIAGNOSIS — H353131 Nonexudative age-related macular degeneration, bilateral, early dry stage: Secondary | ICD-10-CM | POA: Diagnosis not present

## 2021-10-07 DIAGNOSIS — H25813 Combined forms of age-related cataract, bilateral: Secondary | ICD-10-CM | POA: Diagnosis not present

## 2021-10-07 DIAGNOSIS — H02831 Dermatochalasis of right upper eyelid: Secondary | ICD-10-CM | POA: Diagnosis not present

## 2021-10-07 DIAGNOSIS — H02834 Dermatochalasis of left upper eyelid: Secondary | ICD-10-CM | POA: Diagnosis not present

## 2021-10-07 DIAGNOSIS — H52203 Unspecified astigmatism, bilateral: Secondary | ICD-10-CM | POA: Diagnosis not present

## 2021-10-07 DIAGNOSIS — H527 Unspecified disorder of refraction: Secondary | ICD-10-CM | POA: Diagnosis not present

## 2021-10-07 DIAGNOSIS — H11123 Conjunctival concretions, bilateral: Secondary | ICD-10-CM | POA: Diagnosis not present

## 2021-10-08 ENCOUNTER — Encounter: Payer: Self-pay | Admitting: Family Medicine

## 2021-10-08 ENCOUNTER — Telehealth: Payer: Self-pay | Admitting: Family Medicine

## 2021-10-08 ENCOUNTER — Other Ambulatory Visit: Payer: Self-pay

## 2021-10-08 ENCOUNTER — Ambulatory Visit (INDEPENDENT_AMBULATORY_CARE_PROVIDER_SITE_OTHER): Payer: Medicare Other | Admitting: Family Medicine

## 2021-10-08 VITALS — BP 169/75 | HR 83 | Resp 16 | Ht 59.0 in | Wt 162.0 lb

## 2021-10-08 DIAGNOSIS — I5032 Chronic diastolic (congestive) heart failure: Secondary | ICD-10-CM | POA: Diagnosis not present

## 2021-10-08 DIAGNOSIS — I10 Essential (primary) hypertension: Secondary | ICD-10-CM | POA: Diagnosis not present

## 2021-10-08 DIAGNOSIS — R296 Repeated falls: Secondary | ICD-10-CM

## 2021-10-08 DIAGNOSIS — R258 Other abnormal involuntary movements: Secondary | ICD-10-CM | POA: Diagnosis not present

## 2021-10-08 DIAGNOSIS — F418 Other specified anxiety disorders: Secondary | ICD-10-CM

## 2021-10-08 MED ORDER — CARBIDOPA-LEVODOPA 25-100 MG PO TABS: ORAL_TABLET | ORAL | 1 refills | Status: AC

## 2021-10-08 MED ORDER — LISINOPRIL 10 MG PO TABS: 10.0000 mg | ORAL_TABLET | Freq: Every day | ORAL | 0 refills | Status: DC

## 2021-10-08 NOTE — Progress Notes (Signed)
Established Patient Office Visit  Subjective:  Patient ID: Nancy Wilson, female    DOB: 1941-05-13  Age: 81 y.o. MRN: [id number]  CC:  Chief Complaint  Patient presents with   Hypertension    Follow up     HPI Nancy Wilson presents for   Hypertension- Pt denies chest pain, SOB, dizziness, or heart palpitations.  Taking meds as directed w/o problems.  Denies medication side effects.    She is scheduled for cataract surgery on the left in February with Dr. Rosaria Ferries.  She has seen neurology since I last saw her.  They recommended her increasing her Sinemet to 1-1/2 tabs midday.  She does feel like that is been helpful with her tremor and with her falling.  She does use a walker at home but does not have it with her today.    She says she did fall out of bed last week and hit her left facial cheek she still has a little bruise that is healing.  Past Medical History:  Diagnosis Date   Allergy    Depression    long standing   Hyperlipidemia    Hypertension       Social History   Socioeconomic History   Marital status: Married    Spouse name: roger   Number of children: 2   Years of education: 10   Highest education level: 10th grade  Occupational History   Occupation: Housewife    Comment: retired  Tobacco Use   Smoking status: Never   Smokeless tobacco: Never  Vaping Use   Vaping Use: Never used  Substance and Sexual Activity   Alcohol use: No    Alcohol/week: 0.0 standard drinks   Drug use: No   Sexual activity: Not Currently  Other Topics Concern   Not on file  Social History Narrative   Lives with her husband her daughter. She is not doing any exercise right now. She enjoys gardening in her free time.    Social Determinants of Health   Financial Resource Strain: Low Risk    Difficulty of Paying Living Expenses: Not hard at all  Food Insecurity: No Food Insecurity   Worried About Charity fundraiser in the Last Year: Never true   [place]  in the Last Year: Never true  Transportation Needs: No Transportation Needs   Lack of Transportation (Medical): No   Lack of Transportation (Non-Medical): No  Physical Activity: Inactive   Days of Exercise per Week: 0 days   Minutes of Exercise per Session: 0 min  Stress: No Stress Concern Present   Feeling of Stress : Not at all  Social Connections: Moderately Isolated   Frequency of Communication with Friends and Family: More than three times a week   Frequency of Social Gatherings with Friends and Family: More than three times a week   Attends Religious Services: Never   Marine scientist or Organizations: No   Attends Music therapist: Never   Marital Status: Married  Human resources officer Violence: Not At Risk   Fear of Current or Ex-Partner: No   Emotionally Abused: No   Physically Abused: No   Sexually Abused: No    Outpatient Medications Prior to Visit  Medication Sig Dispense Refill   acyclovir (ZOVIRAX) 400 MG tablet TAKE 1 TABLET BY MOUTH AT  BEDTIME 90 tablet 1   alendronate (FOSAMAX) 70 MG tablet TAKE 1 TABLET BY MOUTH  WEEKLY WITH 8 OUNCE OF  PLAIN WATER 1/2 HOUR BEFORE FIRST FOOD DRINK OR MEDS.  STAY UPRIGHT FOR 1/2 HOUR 12 tablet 3   amLODipine (NORVASC) 10 MG tablet TAKE 1 TABLET BY MOUTH  DAILY 90 tablet 1   ASPIRIN 81 PO Take by mouth.     atorvastatin (LIPITOR) 20 MG tablet Take 1 tablet (20 mg total) by mouth daily. 90 tablet 3   Baclofen 5 MG TABS TAKE 1 TABLET BY MOUTH EVERY DAY AT BEDTIME AS NEEDED 90 tablet 0   buPROPion (WELLBUTRIN XL) 300 MG 24 hr tablet TAKE 1 TABLET BY MOUTH  DAILY 90 tablet 3   Cholecalciferol (VITAMIN D3) 2000 units capsule Take 2,000 Units by mouth daily.     eletriptan (RELPAX) 20 MG tablet Take 1 tablet (20 mg total) by mouth as needed for migraine or headache. May repeat in 2 hours if headache persists or recurs. 12 tablet 4   furosemide (LASIX) 20 MG tablet TAKE 1 TABLET BY MOUTH  DAILY AS NEEDED 90 tablet 3    omeprazole (PRILOSEC) 40 MG capsule Take 1 capsule (40 mg total) by mouth daily. 30 capsule 3   oxybutynin (DITROPAN) 5 MG tablet TAKE 1 TABLET BY MOUTH  TWICE DAILY 180 tablet 3   potassium chloride (KLOR-CON) 10 MEQ tablet Take 1 tablet (10 mEq total) by mouth daily. With furosemide 90 tablet 0   propranolol (INDERAL) 40 MG tablet TAKE 1 TABLET BY MOUTH  TWICE DAILY 180 tablet 3   sertraline (ZOLOFT) 100 MG tablet TAKE 1 TABLET BY MOUTH  DAILY 90 tablet 3   carbidopa-levodopa (SINEMET IR) 25-100 MG tablet TAKE 1 TABLET BY MOUTH  DAILY AT BEDTIME 90 tablet 3   diclofenac Sodium (VOLTAREN) 1 % GEL Apply topically.     lidocaine (LIDODERM) 5 % Place 1 patch onto the skin every 12 (twelve) hours. Remove & Discard patch within 12 hours or as directed by MD 30 patch 2   meloxicam (MOBIC) 7.5 MG tablet 1 TAB IN THE MORNING WITH BREAKFAST FOR 2 WEEKS AND THEN STOP 30 tablet 0   No facility-administered medications prior to visit.    Allergies  Allergen Reactions   Hydrocodone-Acetaminophen Hives   Codeine Other (See Comments)   Cymbalta [Duloxetine Hcl] Other (See Comments)   Doxycycline    Effexor [Venlafaxine] Other (See Comments)    Tearful   Guaifenesin    Meperidine Other (See Comments)   Meperidine Hcl    Metronidazole    Morphine Other (See Comments)   Penicillins    Prednisone Other (See Comments)   Propoxyphene Nausea And Vomiting   Prozac [Fluoxetine Hcl] Other (See Comments)   Ropinirole Other (See Comments)    Sweaty and tachycardic    ROS Review of Systems    Objective:    Physical Exam Constitutional:      Appearance: Normal appearance. She is well-developed.  HENT:     Head: Normocephalic and atraumatic.  Cardiovascular:     Rate and Rhythm: Normal rate and regular rhythm.     Heart sounds: Normal heart sounds.  Pulmonary:     Effort: Pulmonary effort is normal.     Breath sounds: Normal breath sounds.  Skin:    General: Skin is warm and dry.   Neurological:     Mental Status: She is alert and oriented to person, place, and time.  Psychiatric:        Behavior: Behavior normal.    BP (!) 169/75 (BP Location: Left Arm)  Pulse 83    Resp 16    Ht 4' 11" (1.499 m)    Wt 162 lb (73.5 kg)    SpO2 94%    BMI 32.72 kg/m  Wt Readings from Last 3 Encounters:  10/08/21 162 lb (73.5 kg)  04/07/21 163 lb (73.9 kg)  02/11/21 159 lb (72.1 kg)     Health Maintenance Due  Topic Date Due   Zoster Vaccines- Shingrix (1 of 2) Never done   COVID-19 Vaccine (3 - Pfizer risk series) 01/23/2020    There are no preventive care reminders to display for this patient.  Lab Results  Component Value Date   TSH 2.26 08/26/2021   Lab Results  Component Value Date   WBC 5.8 09/17/2020   HGB 13.4 09/17/2020   HCT 39.9 09/17/2020   MCV 94.8 09/17/2020   PLT 261 09/17/2020   Lab Results  Component Value Date   NA 139 08/26/2021   K 4.8 08/26/2021   CO2 23 08/26/2021   GLUCOSE 97 08/26/2021   BUN 20 08/26/2021   CREATININE 1.09 (H) 08/26/2021   BILITOT 0.5 08/26/2021   ALKPHOS 92 12/02/2016   AST 18 08/26/2021   ALT 12 08/26/2021   PROT 6.6 08/26/2021   ALBUMIN 4.3 12/02/2016   CALCIUM 9.3 08/26/2021   EGFR 52 (L) 08/26/2021   Lab Results  Component Value Date   CHOL 310 (H) 08/26/2021   Lab Results  Component Value Date   HDL 58 08/26/2021   Lab Results  Component Value Date   LDLCALC 227 (H) 08/26/2021   Lab Results  Component Value Date   TRIG 113 08/26/2021   Lab Results  Component Value Date   CHOLHDL 5.3 (H) 08/26/2021   No results found for: HGBA1C    Assessment & Plan:   Problem List Items Addressed This Visit       Cardiovascular and Mediastinum   HYPERTENSION, BENIGN - Primary    Uncontrolled.  Blood pressure was a little high today.  It is better in early October but it has been persistently elevated since then..  She says she is taking her medications consistently.  I Georgina Peer go ahead and add 10 mg  of lisinopril since she also has a history of heart failure with preserved EF.  And then have her come back in 2 weeks for repeat blood pressure check.      Relevant Medications   lisinopril (ZESTRIL) 10 MG tablet   Heart failure with preserved ejection fraction (HCC)   Relevant Medications   lisinopril (ZESTRIL) 10 MG tablet     Other   Frequent falls    He says she does use her walker at home but just did not bring it in here today with her.      Depression with anxiety    Happy with her current regimen.  We will continue with current medications no changes today      Bradykinesia    Her dose of the carbidopa levodopa was recently increased she does feel like its been helpful she feels like she has not fallen as often and feels like her tremor is actually better controlled.       Meds ordered this encounter  Medications   carbidopa-levodopa (SINEMET IR) 25-100 MG tablet    Sig: Take 1.5 tab in AM, 1.5 tab NOON, and  1 TABLET BY MOUTH  DAILY AT BEDTIME PO    Dispense:  360 tablet    Refill:  1  Requesting 1 year supply   lisinopril (ZESTRIL) 10 MG tablet    Sig: Take 1 tablet (10 mg total) by mouth daily.    Dispense:  30 tablet    Refill:  0    Follow-up: Return in about 6 months (around 04/07/2022) for Hypertension and medications .    Beatrice Lecher, MD

## 2021-10-08 NOTE — Telephone Encounter (Signed)
Please call patient and let her know the last couple of times her blood pressure has been elevated.  I sent over a new prescription for lisinopril 10 mg daily to help lower her blood pressure and to help with her heart failure that she has a history of.  She will continue her regular medications as well and then I would like for her to come back in about 2 weeks for nurse visit to recheck her pressures.

## 2021-10-08 NOTE — Assessment & Plan Note (Addendum)
Uncontrolled.  Blood pressure was a little high today.  It is better in early October but it has been persistently elevated since then..  She says she is taking her medications consistently.  I Nancy Wilson go ahead and add 10 mg of lisinopril since she also has a history of heart failure with preserved EF.  And then have her come back in 2 weeks for repeat blood pressure check.

## 2021-10-08 NOTE — Assessment & Plan Note (Signed)
Happy with her current regimen.  We will continue with current medications no changes today

## 2021-10-08 NOTE — Assessment & Plan Note (Signed)
He says she does use her walker at home but just did not bring it in here today with her.

## 2021-10-08 NOTE — Assessment & Plan Note (Signed)
Her dose of the carbidopa levodopa was recently increased she does feel like its been helpful she feels like she has not fallen as often and feels like her tremor is actually better controlled.

## 2021-10-09 NOTE — Telephone Encounter (Signed)
Left message for a return call

## 2021-10-14 NOTE — Telephone Encounter (Signed)
Patient advised of message and verbalized understanding. Transferred patient to front desk to schedule a 2 week nurse visit.

## 2021-10-19 DIAGNOSIS — H25813 Combined forms of age-related cataract, bilateral: Secondary | ICD-10-CM | POA: Diagnosis not present

## 2021-10-27 DIAGNOSIS — F32A Depression, unspecified: Secondary | ICD-10-CM | POA: Diagnosis not present

## 2021-10-27 DIAGNOSIS — H43813 Vitreous degeneration, bilateral: Secondary | ICD-10-CM | POA: Diagnosis not present

## 2021-10-27 DIAGNOSIS — E785 Hyperlipidemia, unspecified: Secondary | ICD-10-CM | POA: Diagnosis not present

## 2021-10-27 DIAGNOSIS — H25813 Combined forms of age-related cataract, bilateral: Secondary | ICD-10-CM | POA: Diagnosis not present

## 2021-10-27 DIAGNOSIS — I10 Essential (primary) hypertension: Secondary | ICD-10-CM | POA: Diagnosis not present

## 2021-10-27 DIAGNOSIS — K219 Gastro-esophageal reflux disease without esophagitis: Secondary | ICD-10-CM | POA: Diagnosis not present

## 2021-10-27 DIAGNOSIS — M810 Age-related osteoporosis without current pathological fracture: Secondary | ICD-10-CM | POA: Diagnosis not present

## 2021-10-27 DIAGNOSIS — H52223 Regular astigmatism, bilateral: Secondary | ICD-10-CM | POA: Diagnosis not present

## 2021-10-27 DIAGNOSIS — G20 Parkinson's disease: Secondary | ICD-10-CM | POA: Diagnosis not present

## 2021-10-27 DIAGNOSIS — H25811 Combined forms of age-related cataract, right eye: Secondary | ICD-10-CM | POA: Diagnosis not present

## 2021-10-28 ENCOUNTER — Ambulatory Visit: Payer: Medicare Other

## 2021-11-02 ENCOUNTER — Ambulatory Visit: Payer: Medicare Other

## 2021-11-03 DIAGNOSIS — R531 Weakness: Secondary | ICD-10-CM | POA: Diagnosis not present

## 2021-11-03 DIAGNOSIS — Z888 Allergy status to other drugs, medicaments and biological substances status: Secondary | ICD-10-CM | POA: Diagnosis not present

## 2021-11-03 DIAGNOSIS — M818 Other osteoporosis without current pathological fracture: Secondary | ICD-10-CM | POA: Diagnosis not present

## 2021-11-03 DIAGNOSIS — K219 Gastro-esophageal reflux disease without esophagitis: Secondary | ICD-10-CM | POA: Diagnosis not present

## 2021-11-03 DIAGNOSIS — H25812 Combined forms of age-related cataract, left eye: Secondary | ICD-10-CM | POA: Diagnosis not present

## 2021-11-03 DIAGNOSIS — H52222 Regular astigmatism, left eye: Secondary | ICD-10-CM | POA: Diagnosis not present

## 2021-11-03 DIAGNOSIS — Z880 Allergy status to penicillin: Secondary | ICD-10-CM | POA: Diagnosis not present

## 2021-11-03 DIAGNOSIS — E785 Hyperlipidemia, unspecified: Secondary | ICD-10-CM | POA: Diagnosis not present

## 2021-11-03 DIAGNOSIS — H278 Other specified disorders of lens: Secondary | ICD-10-CM | POA: Diagnosis not present

## 2021-11-03 DIAGNOSIS — H25813 Combined forms of age-related cataract, bilateral: Secondary | ICD-10-CM | POA: Diagnosis not present

## 2021-11-03 DIAGNOSIS — Z885 Allergy status to narcotic agent status: Secondary | ICD-10-CM | POA: Diagnosis not present

## 2021-11-03 DIAGNOSIS — F32A Depression, unspecified: Secondary | ICD-10-CM | POA: Diagnosis not present

## 2021-11-03 DIAGNOSIS — I10 Essential (primary) hypertension: Secondary | ICD-10-CM | POA: Diagnosis not present

## 2021-11-06 ENCOUNTER — Other Ambulatory Visit: Payer: Self-pay | Admitting: Family Medicine

## 2021-11-06 DIAGNOSIS — I5032 Chronic diastolic (congestive) heart failure: Secondary | ICD-10-CM

## 2021-11-06 DIAGNOSIS — I10 Essential (primary) hypertension: Secondary | ICD-10-CM

## 2021-11-09 ENCOUNTER — Ambulatory Visit (INDEPENDENT_AMBULATORY_CARE_PROVIDER_SITE_OTHER): Payer: Medicare Other | Admitting: Family Medicine

## 2021-11-09 DIAGNOSIS — I5032 Chronic diastolic (congestive) heart failure: Secondary | ICD-10-CM

## 2021-11-09 DIAGNOSIS — I10 Essential (primary) hypertension: Secondary | ICD-10-CM | POA: Diagnosis not present

## 2021-11-09 MED ORDER — ACYCLOVIR 400 MG PO TABS: 400.0000 mg | ORAL_TABLET | Freq: Every day | ORAL | 1 refills | Status: DC

## 2021-11-09 MED ORDER — LISINOPRIL 10 MG PO TABS: 10.0000 mg | ORAL_TABLET | Freq: Every day | ORAL | 1 refills | Status: DC

## 2021-11-09 MED ORDER — ROPINIROLE HCL 0.25 MG PO TABS: 0.2500 mg | ORAL_TABLET | Freq: Every day | ORAL | 0 refills | Status: DC

## 2021-11-09 NOTE — Progress Notes (Signed)
Agree with documentation as above.   Tonae Livolsi, MD  

## 2021-11-09 NOTE — Progress Notes (Signed)
Patient comes in today for blood pressure check.   Nancy Wilson is taking Lisinopril 10 mg and Amlodipine 10 mg daily for blood pressure control. She denies any missed doses, side effects, headaches, chest pain, palpitations, dizziness, or shortness of breath.   Nancy Wilson hasn't been checking blood pressure readings at home. She states she has checked it once but doesn't remember if it was good or bad.   Her first blood pressure reading today is: 142/75. After sitting, her second reading is: 134/55.   I spoke with Dr. Linford Arnold who advised to stay on current dosage and keep follow up appt. Prescription refills sent to [facility].  Patient also mentioned having a conversation at her last visit to start a medication for restless legs. She is already on Carbidopa Levodopa but states this does not help with RLS. Spoke to Dr. Linford Arnold and she advised she could try a low dose Requip. Patient would like this sent to mail order pharmacy.

## 2021-11-10 DIAGNOSIS — G20 Parkinson's disease: Secondary | ICD-10-CM | POA: Diagnosis not present

## 2021-11-10 DIAGNOSIS — E785 Hyperlipidemia, unspecified: Secondary | ICD-10-CM | POA: Diagnosis not present

## 2021-11-10 DIAGNOSIS — I110 Hypertensive heart disease with heart failure: Secondary | ICD-10-CM | POA: Diagnosis not present

## 2021-11-10 DIAGNOSIS — F32A Depression, unspecified: Secondary | ICD-10-CM | POA: Diagnosis not present

## 2021-11-10 DIAGNOSIS — G2581 Restless legs syndrome: Secondary | ICD-10-CM | POA: Diagnosis not present

## 2021-11-10 DIAGNOSIS — Z9181 History of falling: Secondary | ICD-10-CM | POA: Diagnosis not present

## 2021-11-10 DIAGNOSIS — I5030 Unspecified diastolic (congestive) heart failure: Secondary | ICD-10-CM | POA: Diagnosis not present

## 2021-11-18 DIAGNOSIS — F32A Depression, unspecified: Secondary | ICD-10-CM | POA: Diagnosis not present

## 2021-11-18 DIAGNOSIS — G20 Parkinson's disease: Secondary | ICD-10-CM | POA: Diagnosis not present

## 2021-11-18 DIAGNOSIS — I110 Hypertensive heart disease with heart failure: Secondary | ICD-10-CM | POA: Diagnosis not present

## 2021-11-18 DIAGNOSIS — G2581 Restless legs syndrome: Secondary | ICD-10-CM | POA: Diagnosis not present

## 2021-11-18 DIAGNOSIS — I5030 Unspecified diastolic (congestive) heart failure: Secondary | ICD-10-CM | POA: Diagnosis not present

## 2021-11-18 DIAGNOSIS — Z9181 History of falling: Secondary | ICD-10-CM | POA: Diagnosis not present

## 2021-11-18 DIAGNOSIS — E785 Hyperlipidemia, unspecified: Secondary | ICD-10-CM | POA: Diagnosis not present

## 2021-11-20 DIAGNOSIS — G2581 Restless legs syndrome: Secondary | ICD-10-CM | POA: Diagnosis not present

## 2021-11-20 DIAGNOSIS — E785 Hyperlipidemia, unspecified: Secondary | ICD-10-CM | POA: Diagnosis not present

## 2021-11-20 DIAGNOSIS — F32A Depression, unspecified: Secondary | ICD-10-CM | POA: Diagnosis not present

## 2021-11-20 DIAGNOSIS — G20 Parkinson's disease: Secondary | ICD-10-CM | POA: Diagnosis not present

## 2021-11-20 DIAGNOSIS — I110 Hypertensive heart disease with heart failure: Secondary | ICD-10-CM | POA: Diagnosis not present

## 2021-11-20 DIAGNOSIS — Z9181 History of falling: Secondary | ICD-10-CM | POA: Diagnosis not present

## 2021-11-20 DIAGNOSIS — I5030 Unspecified diastolic (congestive) heart failure: Secondary | ICD-10-CM | POA: Diagnosis not present

## 2021-12-01 DIAGNOSIS — F32A Depression, unspecified: Secondary | ICD-10-CM | POA: Diagnosis not present

## 2021-12-01 DIAGNOSIS — G20 Parkinson's disease: Secondary | ICD-10-CM | POA: Diagnosis not present

## 2021-12-01 DIAGNOSIS — I5030 Unspecified diastolic (congestive) heart failure: Secondary | ICD-10-CM | POA: Diagnosis not present

## 2021-12-01 DIAGNOSIS — H353131 Nonexudative age-related macular degeneration, bilateral, early dry stage: Secondary | ICD-10-CM | POA: Diagnosis not present

## 2021-12-01 DIAGNOSIS — E785 Hyperlipidemia, unspecified: Secondary | ICD-10-CM | POA: Diagnosis not present

## 2021-12-01 DIAGNOSIS — G2581 Restless legs syndrome: Secondary | ICD-10-CM | POA: Diagnosis not present

## 2021-12-01 DIAGNOSIS — Z9181 History of falling: Secondary | ICD-10-CM | POA: Diagnosis not present

## 2021-12-01 DIAGNOSIS — I110 Hypertensive heart disease with heart failure: Secondary | ICD-10-CM | POA: Diagnosis not present

## 2021-12-03 DIAGNOSIS — I110 Hypertensive heart disease with heart failure: Secondary | ICD-10-CM | POA: Diagnosis not present

## 2021-12-03 DIAGNOSIS — F32A Depression, unspecified: Secondary | ICD-10-CM | POA: Diagnosis not present

## 2021-12-03 DIAGNOSIS — E785 Hyperlipidemia, unspecified: Secondary | ICD-10-CM | POA: Diagnosis not present

## 2021-12-03 DIAGNOSIS — G2581 Restless legs syndrome: Secondary | ICD-10-CM | POA: Diagnosis not present

## 2021-12-03 DIAGNOSIS — I5030 Unspecified diastolic (congestive) heart failure: Secondary | ICD-10-CM | POA: Diagnosis not present

## 2021-12-03 DIAGNOSIS — G20 Parkinson's disease: Secondary | ICD-10-CM | POA: Diagnosis not present

## 2021-12-03 DIAGNOSIS — Z9181 History of falling: Secondary | ICD-10-CM | POA: Diagnosis not present

## 2021-12-08 ENCOUNTER — Emergency Department
Admission: EM | Admit: 2021-12-08 | Discharge: 2021-12-08 | Disposition: A | Discharge status: Home / Self Care | Payer: Medicare Other | Source: Home / Self Care

## 2021-12-08 ENCOUNTER — Emergency Department (INDEPENDENT_AMBULATORY_CARE_PROVIDER_SITE_OTHER): Payer: Medicare Other

## 2021-12-08 ENCOUNTER — Other Ambulatory Visit: Payer: Self-pay

## 2021-12-08 DIAGNOSIS — W19XXXA Unspecified fall, initial encounter: Secondary | ICD-10-CM

## 2021-12-08 DIAGNOSIS — M542 Cervicalgia: Secondary | ICD-10-CM | POA: Diagnosis not present

## 2021-12-08 DIAGNOSIS — M62838 Other muscle spasm: Secondary | ICD-10-CM

## 2021-12-08 MED ORDER — BACLOFEN 10 MG PO TABS: 10.0000 mg | ORAL_TABLET | Freq: Three times a day (TID) | ORAL | 0 refills | Status: DC

## 2021-12-08 MED ORDER — CELECOXIB 100 MG PO CAPS: 100.0000 mg | ORAL_CAPSULE | Freq: Two times a day (BID) | ORAL | 0 refills | Status: AC

## 2021-12-08 NOTE — Discharge Instructions (Addendum)
Advised and informed patient/spouse of neck x-ray results this evening cervical spondylosis, negative for fracture, prevertebral soft tissue swelling of C5 and C6 could be due to hematoma or prominent soft tissues.  Hard copy of the study provided to patient with AVS.  Advised patient to take medication as directed with food to completion.  Encourage patient to increase daily water intake while taking this medication.  Advised patient may use baclofen daily or as needed for accompanying trapezius muscle spasms.  Advised patient if symptoms worsen and/or unresolved please follow-up with PCP or here for further evaluation. ?

## 2021-12-08 NOTE — ED Triage Notes (Signed)
Pt here today for back pain as well as RT shoulder and neck pain x 3 days. Husband here today with pt who says shes been falling more. Had a fall 1 week ago into the brick hearth of the fireplace. Heat prn. Hx pf parkinson's. ?

## 2021-12-08 NOTE — ED Provider Notes (Signed)
?[facility] ? ? ? ?CSN: [id number] ?Arrival date & time: 12/08/21  1546 ? ? ?  ? ?History   ?Chief Complaint ?Chief Complaint  ?Patient presents with  ? Back Pain  ? Shoulder Pain  ?  And neck  ? ? ?HPI ?INITA Wilson is a 81 y.o. female.  ? ?HPI Pleasant 81 year old female presents with back pain as well as right shoulder and neck pain for 3 days.  Husband here today with patient who says she has been falling more.  Had fall 1 week ago and did break hearth of the fireplace.  PMH significant for Parkinson's disease, CKD stage III, and lumbar facet joint pain. ? ?Past Medical History:  ?Diagnosis Date  ? Allergy   ? Depression   ? long standing  ? Hyperlipidemia   ? Hypertension   ? ? ?Patient Active Problem List  ? Diagnosis Date Noted  ? Heart failure with preserved ejection fraction ([place]) 09/24/2020  ? Bradykinesia 05/22/2020  ? Gait instability 06/05/2019  ? Sacroiliac joint pain 12/28/2018  ? Lumbar facet joint pain 12/28/2018  ? Encounter for long-term use of opiate analgesic 12/28/2018  ? Closed compression fracture of L1 lumbar vertebra, initial encounter ([place]) 12/28/2018  ? Age-related osteoporosis with current pathological fracture 12/28/2018  ? Lumbar foraminal stenosis 12/28/2018  ? Depression with anxiety 11/29/2018  ? RLS (restless legs syndrome) 11/29/2018  ? Frequent falls 09/21/2018  ? OAB (overactive bladder) 05/30/2017  ? Benign essential tremor 04/18/2017  ? Posterior vitreous detachment of left eye 05/20/2016  ? Trigger middle finger of right hand 12/01/2015  ? Bilateral plantar fasciitis 08/09/2014  ? Osteoarthritis of both ankles 08/09/2014  ? S/P laparoscopic appendectomy 12/13/2011  ? Night sweats 12/13/2011  ? Herpes simplex type II infection 09/23/2011  ? BENIGN POSITIONAL VERTIGO 04/24/2010  ? Hyperlipidemia 03/28/2009  ? Hypothyroidism 04/03/2008  ? Vitamin D deficiency 04/03/2008  ? HYPERTENSION, BENIGN 04/03/2008  ? GERD 04/03/2008  ? HIATAL HERNIA 04/03/2008  ?  DIVERTICULOSIS OF COLON 04/03/2008  ? CKD (chronic kidney disease) stage 3, GFR 30-59 ml/min (HCC) 04/03/2008  ? OSTEOARTHRITIS 04/03/2008  ? Osteoporosis 04/03/2008  ? EDEMA 04/03/2008  ? Migraine headache without aura 04/03/2008  ? ? ?Past Surgical History:  ?Procedure Laterality Date  ? carpel tunnel release, both hands  1980's  ? DILATION AND CURETTAGE OF UTERUS    ? FRACTURE SURGERY    ? fell and broke right arm  ? GASTROSCOPY  11-06  ? trigger finger release, left finger    ? TUBAL LIGATION    ? bilateral  ? ? ?OB History   ?No obstetric history on file. ?  ? ? ? ?Home Medications   ? ?Prior to Admission medications   ?Medication Sig Start Date End Date Taking? Authorizing Provider  ?baclofen (LIORESAL) 10 MG tablet Take 1 tablet (10 mg total) by mouth 3 (three) times daily. 12/08/21  Yes Eliezer Lofts, FNP  ?celecoxib (CELEBREX) 100 MG capsule Take 1 capsule (100 mg total) by mouth 2 (two) times daily for 15 days. 12/08/21 12/23/21 Yes Eliezer Lofts, FNP  ?acyclovir (ZOVIRAX) 400 MG tablet Take 1 tablet (400 mg total) by mouth at bedtime. 11/09/21   Hali Marry, MD  ?alendronate (FOSAMAX) 70 MG tablet TAKE 1 TABLET BY MOUTH  WEEKLY WITH 8 OUNCE OF  PLAIN WATER 1/2 HOUR BEFORE FIRST FOOD DRINK OR MEDS.  STAY UPRIGHT FOR 1/2 HOUR 08/19/21   Hali Marry, MD  ?amLODipine (NORVASC) 10 MG  tablet TAKE 1 TABLET BY MOUTH  DAILY 07/24/21   Hali Marry, MD  ?ASPIRIN 81 PO Take by mouth.    [provider]  ?atorvastatin (LIPITOR) 20 MG tablet Take 1 tablet (20 mg total) by mouth daily. 09/03/21   Hali Marry, MD  ?buPROPion (WELLBUTRIN XL) 300 MG 24 hr tablet TAKE 1 TABLET BY MOUTH  DAILY 06/11/21   Hali Marry, MD  ?carbidopa-levodopa (SINEMET IR) 25-100 MG tablet Take 1.5 tab in AM, 1.5 tab NOON, and  1 TABLET BY MOUTH  DAILY AT BEDTIME PO 10/08/21   Hali Marry, MD  ?Cholecalciferol (VITAMIN D3) 2000 units capsule Take 2,000 Units by mouth daily.     [provider]  ?lisinopril (ZESTRIL) 10 MG tablet Take 1 tablet (10 mg total) by mouth daily. 11/09/21   Hali Marry, MD  ?oxybutynin (DITROPAN) 5 MG tablet TAKE 1 TABLET BY MOUTH  TWICE DAILY 08/17/21   Hali Marry, MD  ?propranolol (INDERAL) 40 MG tablet TAKE 1 TABLET BY MOUTH  TWICE DAILY 06/11/21   Hali Marry, MD  ?rOPINIRole (REQUIP) 0.25 MG tablet Take 1 tablet (0.25 mg total) by mouth at bedtime. 11/09/21   Hali Marry, MD  ?sertraline (ZOLOFT) 100 MG tablet TAKE 1 TABLET BY MOUTH  DAILY 06/18/21   Hali Marry, MD  ? ? ?Family History ?Family History  ?Problem Relation Age of Onset  ? Diabetes Mother   ? Hyperlipidemia Mother   ? Heart disease Mother 18  ? Hypertension Mother   ? Diabetes Father   ? Hyperlipidemia Father   ? Tremor Father   ?     hand- started in his 44's  ? Hypertension Father   ? Tremor Cousin   ? ? ?Social History ?Social History  ? ?Tobacco Use  ? Smoking status: Never  ? Smokeless tobacco: Never  ?Vaping Use  ? Vaping Use: Never used  ?Substance Use Topics  ? Alcohol use: No  ?  Alcohol/week: 0.0 standard drinks  ? Drug use: No  ? ? ? ?Allergies   ?Hydrocodone-acetaminophen, Codeine, Cymbalta [duloxetine hcl], Doxycycline, Effexor [venlafaxine], Guaifenesin, Meperidine, Meperidine hcl, Metronidazole, Morphine, Penicillins, Prednisone, Propoxyphene, Prozac [fluoxetine hcl], and Ropinirole ? ? ?Review of Systems ?Review of Systems  ?Musculoskeletal:  Positive for back pain and neck pain.  ?All other systems reviewed and are negative. ? ? ?Physical Exam ?Triage Vital Signs ?ED Triage Vitals  ?Enc Vitals Group  ?   BP 12/08/21 1605 (!) 184/90  ?   Pulse Rate 12/08/21 1605 76  ?   Resp 12/08/21 1605 17  ?   Temp 12/08/21 1605 98.3 ?F (36.8 ?C)  ?   Temp Source 12/08/21 1605 Oral  ?   SpO2 12/08/21 1605 97 %  ?   Weight --   ?   Height --   ?   Head Circumference --   ?   Peak Flow --   ?   Pain Score 12/08/21 1609 8  ?   Pain Loc --    ?   Pain Edu? --   ?   Excl. in [place]? --   ? ?No data found. ? ?Updated Vital Signs ?BP (!) 184/90 (BP Location: Right Arm)   Pulse 76   Temp 98.3 ?F (36.8 ?C) (Oral)   Resp 17   SpO2 97%  ? ? ?Physical Exam ?Vitals and nursing note reviewed.  ?Constitutional:   ?   General: She is not  in acute distress. ?   Appearance: She is normal weight. She is not ill-appearing.  ?HENT:  ?   Head: Normocephalic and atraumatic.  ?   Mouth/Throat:  ?   Mouth: Mucous membranes are moist.  ?   Pharynx: Oropharynx is clear.  ?Eyes:  ?   Extraocular Movements: Extraocular movements intact.  ?   Conjunctiva/sclera: Conjunctivae normal.  ?   Pupils: Pupils are equal, round, and reactive to light.  ?Neck:  ?   Comments: Cervical spine (posterior inferior aspect): Severe LROM with 4 planes of TTP over C4-C7, palpable muscle adhesions noted over bilateral trapezius muscles and superior rhomboids. ?Cardiovascular:  ?   Rate and Rhythm: Normal rate and regular rhythm.  ?   Pulses: Normal pulses.  ?   Heart sounds: Normal heart sounds.  ?Pulmonary:  ?   Effort: Pulmonary effort is normal.  ?   Breath sounds: Normal breath sounds. No wheezing, rhonchi or rales.  ?Musculoskeletal:  ?   Cervical back: Neck supple.  ?Skin: ?   General: Skin is warm and dry.  ?Neurological:  ?   General: No focal deficit present.  ?   Mental Status: She is alert and oriented to person, place, and time. Mental status is at baseline.  ? ? ? ?UC Treatments / Results  ?Labs ?(all labs ordered are listed, but only abnormal results are displayed) ?Labs Reviewed - No data to display ? ?EKG ? ? ?Radiology ?DG Cervical Spine Complete ? ?Result Date: 12/08/2021 ?CLINICAL DATA:  Fall 1 week ago.  Neck pain EXAM: CERVICAL SPINE - COMPLETE 4+ VIEW COMPARISON:  CT cervical spine 09/10/2014 FINDINGS: Normal alignment. No fracture or mass. Prevertebral soft tissue swelling at C5-6 appears more prominent compared to the prior CT. Disc degeneration and spurring throughout the  cervical spine most prominent C3 through C7. IMPRESSION: Cervical spondylosis.  Negative for fracture Prevertebral soft tissue swelling C5 and C6 could be due to hematoma or prominent soft tissues. CT of clinically indicated

## 2021-12-09 DIAGNOSIS — I110 Hypertensive heart disease with heart failure: Secondary | ICD-10-CM | POA: Diagnosis not present

## 2021-12-09 DIAGNOSIS — E785 Hyperlipidemia, unspecified: Secondary | ICD-10-CM | POA: Diagnosis not present

## 2021-12-09 DIAGNOSIS — Z9181 History of falling: Secondary | ICD-10-CM | POA: Diagnosis not present

## 2021-12-09 DIAGNOSIS — F32A Depression, unspecified: Secondary | ICD-10-CM | POA: Diagnosis not present

## 2021-12-09 DIAGNOSIS — G2581 Restless legs syndrome: Secondary | ICD-10-CM | POA: Diagnosis not present

## 2021-12-09 DIAGNOSIS — G20 Parkinson's disease: Secondary | ICD-10-CM | POA: Diagnosis not present

## 2021-12-09 DIAGNOSIS — I5030 Unspecified diastolic (congestive) heart failure: Secondary | ICD-10-CM | POA: Diagnosis not present

## 2021-12-14 DIAGNOSIS — F32A Depression, unspecified: Secondary | ICD-10-CM | POA: Diagnosis not present

## 2021-12-14 DIAGNOSIS — E785 Hyperlipidemia, unspecified: Secondary | ICD-10-CM | POA: Diagnosis not present

## 2021-12-14 DIAGNOSIS — Z9181 History of falling: Secondary | ICD-10-CM | POA: Diagnosis not present

## 2021-12-14 DIAGNOSIS — I110 Hypertensive heart disease with heart failure: Secondary | ICD-10-CM | POA: Diagnosis not present

## 2021-12-14 DIAGNOSIS — G20 Parkinson's disease: Secondary | ICD-10-CM | POA: Diagnosis not present

## 2021-12-14 DIAGNOSIS — I5030 Unspecified diastolic (congestive) heart failure: Secondary | ICD-10-CM | POA: Diagnosis not present

## 2021-12-14 DIAGNOSIS — G2581 Restless legs syndrome: Secondary | ICD-10-CM | POA: Diagnosis not present

## 2021-12-18 DIAGNOSIS — G20 Parkinson's disease: Secondary | ICD-10-CM | POA: Diagnosis not present

## 2021-12-18 DIAGNOSIS — F32A Depression, unspecified: Secondary | ICD-10-CM | POA: Diagnosis not present

## 2021-12-18 DIAGNOSIS — I5030 Unspecified diastolic (congestive) heart failure: Secondary | ICD-10-CM | POA: Diagnosis not present

## 2021-12-18 DIAGNOSIS — I110 Hypertensive heart disease with heart failure: Secondary | ICD-10-CM | POA: Diagnosis not present

## 2021-12-18 DIAGNOSIS — G2581 Restless legs syndrome: Secondary | ICD-10-CM | POA: Diagnosis not present

## 2021-12-18 DIAGNOSIS — E785 Hyperlipidemia, unspecified: Secondary | ICD-10-CM | POA: Diagnosis not present

## 2021-12-18 DIAGNOSIS — Z9181 History of falling: Secondary | ICD-10-CM | POA: Diagnosis not present

## 2021-12-21 DIAGNOSIS — E785 Hyperlipidemia, unspecified: Secondary | ICD-10-CM | POA: Diagnosis not present

## 2021-12-21 DIAGNOSIS — G20 Parkinson's disease: Secondary | ICD-10-CM | POA: Diagnosis not present

## 2021-12-21 DIAGNOSIS — I110 Hypertensive heart disease with heart failure: Secondary | ICD-10-CM | POA: Diagnosis not present

## 2021-12-21 DIAGNOSIS — G2581 Restless legs syndrome: Secondary | ICD-10-CM | POA: Diagnosis not present

## 2021-12-21 DIAGNOSIS — Z9181 History of falling: Secondary | ICD-10-CM | POA: Diagnosis not present

## 2021-12-21 DIAGNOSIS — F32A Depression, unspecified: Secondary | ICD-10-CM | POA: Diagnosis not present

## 2021-12-21 DIAGNOSIS — I5030 Unspecified diastolic (congestive) heart failure: Secondary | ICD-10-CM | POA: Diagnosis not present

## 2021-12-25 DIAGNOSIS — G2581 Restless legs syndrome: Secondary | ICD-10-CM | POA: Diagnosis not present

## 2021-12-25 DIAGNOSIS — E785 Hyperlipidemia, unspecified: Secondary | ICD-10-CM | POA: Diagnosis not present

## 2021-12-25 DIAGNOSIS — F32A Depression, unspecified: Secondary | ICD-10-CM | POA: Diagnosis not present

## 2021-12-25 DIAGNOSIS — I5030 Unspecified diastolic (congestive) heart failure: Secondary | ICD-10-CM | POA: Diagnosis not present

## 2021-12-25 DIAGNOSIS — G20 Parkinson's disease: Secondary | ICD-10-CM | POA: Diagnosis not present

## 2021-12-25 DIAGNOSIS — I110 Hypertensive heart disease with heart failure: Secondary | ICD-10-CM | POA: Diagnosis not present

## 2021-12-25 DIAGNOSIS — Z9181 History of falling: Secondary | ICD-10-CM | POA: Diagnosis not present

## 2021-12-28 DIAGNOSIS — G20 Parkinson's disease: Secondary | ICD-10-CM | POA: Diagnosis not present

## 2021-12-28 DIAGNOSIS — I5030 Unspecified diastolic (congestive) heart failure: Secondary | ICD-10-CM | POA: Diagnosis not present

## 2021-12-28 DIAGNOSIS — F32A Depression, unspecified: Secondary | ICD-10-CM | POA: Diagnosis not present

## 2021-12-28 DIAGNOSIS — G2581 Restless legs syndrome: Secondary | ICD-10-CM | POA: Diagnosis not present

## 2021-12-28 DIAGNOSIS — Z9181 History of falling: Secondary | ICD-10-CM | POA: Diagnosis not present

## 2021-12-28 DIAGNOSIS — I110 Hypertensive heart disease with heart failure: Secondary | ICD-10-CM | POA: Diagnosis not present

## 2021-12-28 DIAGNOSIS — E785 Hyperlipidemia, unspecified: Secondary | ICD-10-CM | POA: Diagnosis not present

## 2022-01-01 DIAGNOSIS — I110 Hypertensive heart disease with heart failure: Secondary | ICD-10-CM | POA: Diagnosis not present

## 2022-01-01 DIAGNOSIS — G2581 Restless legs syndrome: Secondary | ICD-10-CM | POA: Diagnosis not present

## 2022-01-01 DIAGNOSIS — F32A Depression, unspecified: Secondary | ICD-10-CM | POA: Diagnosis not present

## 2022-01-01 DIAGNOSIS — I5030 Unspecified diastolic (congestive) heart failure: Secondary | ICD-10-CM | POA: Diagnosis not present

## 2022-01-01 DIAGNOSIS — E785 Hyperlipidemia, unspecified: Secondary | ICD-10-CM | POA: Diagnosis not present

## 2022-01-01 DIAGNOSIS — Z9181 History of falling: Secondary | ICD-10-CM | POA: Diagnosis not present

## 2022-01-01 DIAGNOSIS — G20 Parkinson's disease: Secondary | ICD-10-CM | POA: Diagnosis not present

## 2022-01-06 ENCOUNTER — Other Ambulatory Visit: Payer: Self-pay | Admitting: Family Medicine

## 2022-01-06 DIAGNOSIS — I10 Essential (primary) hypertension: Secondary | ICD-10-CM

## 2022-01-07 DIAGNOSIS — G2581 Restless legs syndrome: Secondary | ICD-10-CM | POA: Diagnosis not present

## 2022-01-07 DIAGNOSIS — E785 Hyperlipidemia, unspecified: Secondary | ICD-10-CM | POA: Diagnosis not present

## 2022-01-07 DIAGNOSIS — Z9181 History of falling: Secondary | ICD-10-CM | POA: Diagnosis not present

## 2022-01-07 DIAGNOSIS — I110 Hypertensive heart disease with heart failure: Secondary | ICD-10-CM | POA: Diagnosis not present

## 2022-01-07 DIAGNOSIS — I5030 Unspecified diastolic (congestive) heart failure: Secondary | ICD-10-CM | POA: Diagnosis not present

## 2022-01-07 DIAGNOSIS — G20 Parkinson's disease: Secondary | ICD-10-CM | POA: Diagnosis not present

## 2022-01-07 DIAGNOSIS — F32A Depression, unspecified: Secondary | ICD-10-CM | POA: Diagnosis not present

## 2022-01-10 ENCOUNTER — Other Ambulatory Visit: Payer: Self-pay | Admitting: Family Medicine

## 2022-02-11 ENCOUNTER — Other Ambulatory Visit: Payer: Self-pay | Admitting: Family Medicine

## 2022-02-11 DIAGNOSIS — I10 Essential (primary) hypertension: Secondary | ICD-10-CM

## 2022-02-11 DIAGNOSIS — I5032 Chronic diastolic (congestive) heart failure: Secondary | ICD-10-CM

## 2022-03-03 DIAGNOSIS — M25561 Pain in right knee: Secondary | ICD-10-CM | POA: Diagnosis not present

## 2022-03-03 DIAGNOSIS — M94261 Chondromalacia, right knee: Secondary | ICD-10-CM | POA: Diagnosis not present

## 2022-03-10 DIAGNOSIS — Z79899 Other long term (current) drug therapy: Secondary | ICD-10-CM | POA: Diagnosis not present

## 2022-03-10 DIAGNOSIS — R296 Repeated falls: Secondary | ICD-10-CM | POA: Diagnosis not present

## 2022-03-10 DIAGNOSIS — R258 Other abnormal involuntary movements: Secondary | ICD-10-CM | POA: Diagnosis not present

## 2022-03-30 ENCOUNTER — Other Ambulatory Visit: Payer: Self-pay | Admitting: Family Medicine

## 2022-03-30 DIAGNOSIS — I10 Essential (primary) hypertension: Secondary | ICD-10-CM

## 2022-04-07 ENCOUNTER — Ambulatory Visit (INDEPENDENT_AMBULATORY_CARE_PROVIDER_SITE_OTHER): Payer: Medicare Other | Admitting: Family Medicine

## 2022-04-07 VITALS — BP 136/50 | HR 80 | Ht 59.0 in | Wt 159.0 lb

## 2022-04-07 DIAGNOSIS — F418 Other specified anxiety disorders: Secondary | ICD-10-CM

## 2022-04-07 DIAGNOSIS — R252 Cramp and spasm: Secondary | ICD-10-CM

## 2022-04-07 DIAGNOSIS — K2100 Gastro-esophageal reflux disease with esophagitis, without bleeding: Secondary | ICD-10-CM

## 2022-04-07 DIAGNOSIS — I10 Essential (primary) hypertension: Secondary | ICD-10-CM

## 2022-04-07 DIAGNOSIS — G2581 Restless legs syndrome: Secondary | ICD-10-CM | POA: Diagnosis not present

## 2022-04-07 DIAGNOSIS — N1831 Chronic kidney disease, stage 3a: Secondary | ICD-10-CM

## 2022-04-07 DIAGNOSIS — R296 Repeated falls: Secondary | ICD-10-CM | POA: Diagnosis not present

## 2022-04-07 DIAGNOSIS — E039 Hypothyroidism, unspecified: Secondary | ICD-10-CM | POA: Diagnosis not present

## 2022-04-07 DIAGNOSIS — J029 Acute pharyngitis, unspecified: Secondary | ICD-10-CM | POA: Diagnosis not present

## 2022-04-07 MED ORDER — PANTOPRAZOLE SODIUM 40 MG PO TBEC
40.0000 mg | DELAYED_RELEASE_TABLET | Freq: Every day | ORAL | 1 refills | Status: DC
Start: 2022-04-07 — End: 2022-04-30

## 2022-04-07 MED ORDER — SERTRALINE HCL 100 MG PO TABS: 150.0000 mg | ORAL_TABLET | Freq: Every day | ORAL | 1 refills | Status: DC

## 2022-04-07 NOTE — Assessment & Plan Note (Signed)
He also reports continued cramping.  In the past she has had problems with cramping in her feet and legs.  She says now that is really not a big issue it is more her hands and arms.  We will check for magnesium deficiency iron deficiency B12 and thyroid.  We will also check CK to make sure there is no sign of excess muscle breakdown.  No specific triggers.

## 2022-04-07 NOTE — Assessment & Plan Note (Signed)
Taking requip at night.

## 2022-04-07 NOTE — Progress Notes (Signed)
Established Patient Office Visit  Subjective   Patient ID: Nancy Wilson, female    DOB: [birth year]  Age: 81 y.o. MRN: [id number]  Chief Complaint  Patient presents with   Hypertension    HPI  Hypertension- Pt denies chest pain, SOB, dizziness, or heart palpitations.  Taking meds as directed w/o problems.  Denies medication side effects.    F/U Heart failure -following with Dr. Antonietta Barcelona see at [facility].  They are also considering the possibility of multisystem atrophy.  They were working on increasing her Sinemet to 1.5 tablets 3 times a day as tolerated.  Parkinsons - she is taking 1.5 tabs Sinemet in AM, midday and 1 tab at night.  She said for a short period of time she actually ran out of her medication and says she really did not notice a big difference.  She was out of it for almost a week.  Less leg syndrome-she is taking the ropinirole nightly.  She also complains of a sore throat that she has had for at least 6 weeks.  It just does not seem to be going away.  She has not had any other cough or drainage with it.  She has had some increase in reflux symptoms.  Reporting feeling like a bitter or sour tasting fluid is coming up in the back of her throat.    ROS    Objective:     BP (!) 136/50 (BP Location: Left Arm)   Pulse 80   Ht 4\' 11"  (1.499 m)   Wt 159 lb (72.1 kg)   SpO2 96%   BMI 32.11 kg/m    Physical Exam Vitals and nursing note reviewed.  Constitutional:      Appearance: She is well-developed.  HENT:     Head: Normocephalic and atraumatic.  Cardiovascular:     Rate and Rhythm: Normal rate and regular rhythm.     Heart sounds: Normal heart sounds.  Pulmonary:     Effort: Pulmonary effort is normal.     Breath sounds: Normal breath sounds.  Skin:    General: Skin is warm and dry.  Neurological:     Mental Status: She is alert and oriented to person, place, and time.  Psychiatric:        Behavior: Behavior normal.      No results found for  any visits on 04/07/22.    The ASCVD Risk score (Arnett DK, et al., 2019) failed to calculate for the following reasons:   The 2019 ASCVD risk score is only valid for ages 3 to 19    Assessment & Plan:   Problem List Items Addressed This Visit       Cardiovascular and Mediastinum   HYPERTENSION, BENIGN - Primary    Well controlled. Continue current regimen. Follow up in  6 months.        Relevant Medications   pantoprazole (PROTONIX) 40 MG tablet   Other Relevant Orders   BASIC METABOLIC PANEL WITH GFR   CK (Creatine Kinase)   Magnesium   Ferritin   B12   TSH     Digestive   GERD    Discussed a trial of a PPI.  We will send over pantoprazole.  I would be interested to see if this helps with the reflux symptoms in addition to the sore throat.  But if the sore throat is not improving over the next 5 days then I would like to know about it.  Relevant Medications   pantoprazole (PROTONIX) 40 MG tablet     Endocrine   Hypothyroidism   Relevant Orders   BASIC METABOLIC PANEL WITH GFR   CK (Creatine Kinase)   Magnesium   Ferritin   B12   TSH     Genitourinary   CKD (chronic kidney disease) stage 3, GFR 30-59 ml/min (HCC)   Relevant Orders   BASIC METABOLIC PANEL WITH GFR   CK (Creatine Kinase)   Magnesium   Ferritin   B12   TSH     Other   RLS (restless legs syndrome)    Taking requip at night.        Relevant Orders   BASIC METABOLIC PANEL WITH GFR   CK (Creatine Kinase)   Magnesium   Ferritin   B12   TSH   Muscle cramping    He also reports continued cramping.  In the past she has had problems with cramping in her feet and legs.  She says now that is really not a big issue it is more her hands and arms.  We will check for magnesium deficiency iron deficiency B12 and thyroid.  We will also check CK to make sure there is no sign of excess muscle breakdown.  No specific triggers.      Relevant Orders   BASIC METABOLIC PANEL WITH GFR   CK  (Creatine Kinase)   Magnesium   Ferritin   B12   TSH   Frequent falls    He is still falling frequently but says she is not interested in formal PT or someone coming to the home to do PT.  She says that she had that before and did not feel like it was particularly helpful.  Normally she does have a walker but did not have it with her today.      Depression with anxiety    She does report some significant depression symptoms and anxiety symptoms.  She would like to go up on her Sertraline. Will inc to 150mg .  If she does not tolerate well then okay to go back down to 100 mg.  New prescription sent to pharmacy.      Relevant Medications   sertraline (ZOLOFT) 100 MG tablet   Other Visit Diagnoses     Sore throat           Sore throat/pharyngitis- may be related to GERD. If not better in 5 days then please let me know   Return in about 6 weeks (around 05/19/2022) for Follow up Mood, inc dose on sertraline. .   I spent 40 minutes on the day of the encounter to include pre-visit record review, face-to-face time with the patient and post visit ordering of test.   05/21/2022, MD

## 2022-04-07 NOTE — Assessment & Plan Note (Signed)
Well controlled. Continue current regimen. Follow up in  6 months.  

## 2022-04-07 NOTE — Assessment & Plan Note (Signed)
He is still falling frequently but says she is not interested in formal PT or someone coming to the home to do PT.  She says that she had that before and did not feel like it was particularly helpful.  Normally she does have a walker but did not have it with her today.

## 2022-04-07 NOTE — Assessment & Plan Note (Addendum)
She does report some significant depression symptoms and anxiety symptoms.  She would like to go up on her Sertraline. Will inc to 150mg .  If she does not tolerate well then okay to go back down to 100 mg.  New prescription sent to pharmacy.

## 2022-04-07 NOTE — Assessment & Plan Note (Signed)
Discussed a trial of a PPI.  We will send over pantoprazole.  I would be interested to see if this helps with the reflux symptoms in addition to the sore throat.  But if the sore throat is not improving over the next 5 days then I would like to know about it.

## 2022-04-08 LAB — BASIC METABOLIC PANEL WITH GFR
BUN/Creatinine Ratio: 19 (calc) (ref 6–22)
BUN: 22 mg/dL (ref 7–25)
CO2: 25 mmol/L (ref 20–32)
Calcium: 9 mg/dL (ref 8.6–10.4)
Chloride: 107 mmol/L (ref 98–110)
Creat: 1.17 mg/dL — ABNORMAL HIGH (ref 0.60–0.95)
Glucose, Bld: 86 mg/dL (ref 65–139)
Potassium: 4.5 mmol/L (ref 3.5–5.3)
Sodium: 142 mmol/L (ref 135–146)
eGFR: 47 mL/min/{1.73_m2} — ABNORMAL LOW (ref >=60)

## 2022-04-08 LAB — MAGNESIUM: Magnesium: 2.2 mg/dL (ref 1.5–2.5)

## 2022-04-08 LAB — TSH: TSH: 1.76 mIU/L (ref 0.40–4.50)

## 2022-04-08 LAB — FERRITIN: Ferritin: 41 ng/mL (ref 16–288)

## 2022-04-08 LAB — CK: Total CK: 105 U/L (ref 29–143)

## 2022-04-08 LAB — VITAMIN B12: Vitamin B-12: 298 pg/mL (ref 200–1100)

## 2022-04-09 NOTE — Progress Notes (Signed)
Call patient: Kidney function stable muscle enzyme and magnesium are normal so these are not causing any of her cramping.  Iron stores are normal and B12 looks good.  Thyroid looks good as well.  Recommend a trial of over-the-counter vitamin B6 for the cramping.  She could try it once a day for the next 2 months to see if she feels like it is helpful.

## 2022-04-12 ENCOUNTER — Telehealth: Payer: Self-pay | Admitting: Family Medicine

## 2022-04-12 NOTE — Telephone Encounter (Signed)
Pt called around 4:18 asking for her lab results. I told her someone would have to call her back the following day.

## 2022-04-13 NOTE — Telephone Encounter (Signed)
Attempted a call to the patient. No answer. Left a brief voicemail for patient to return call back to office regarding lab results. Direct call back info provided.

## 2022-04-15 NOTE — Telephone Encounter (Signed)
Looks like patient was advised of results (documented in results).

## 2022-04-30 ENCOUNTER — Other Ambulatory Visit: Payer: Self-pay | Admitting: Family Medicine

## 2022-04-30 DIAGNOSIS — I10 Essential (primary) hypertension: Secondary | ICD-10-CM

## 2022-05-01 ENCOUNTER — Other Ambulatory Visit: Payer: Self-pay | Admitting: Family Medicine

## 2022-05-01 DIAGNOSIS — F341 Dysthymic disorder: Secondary | ICD-10-CM

## 2022-05-01 DIAGNOSIS — I10 Essential (primary) hypertension: Secondary | ICD-10-CM

## 2022-05-07 DIAGNOSIS — Z79899 Other long term (current) drug therapy: Secondary | ICD-10-CM | POA: Diagnosis not present

## 2022-05-07 DIAGNOSIS — M25572 Pain in left ankle and joints of left foot: Secondary | ICD-10-CM | POA: Diagnosis not present

## 2022-05-07 DIAGNOSIS — R600 Localized edema: Secondary | ICD-10-CM | POA: Diagnosis not present

## 2022-05-07 DIAGNOSIS — Z885 Allergy status to narcotic agent status: Secondary | ICD-10-CM | POA: Diagnosis not present

## 2022-05-07 DIAGNOSIS — Z880 Allergy status to penicillin: Secondary | ICD-10-CM | POA: Diagnosis not present

## 2022-05-07 DIAGNOSIS — M7989 Other specified soft tissue disorders: Secondary | ICD-10-CM | POA: Diagnosis not present

## 2022-05-07 DIAGNOSIS — Z888 Allergy status to other drugs, medicaments and biological substances status: Secondary | ICD-10-CM | POA: Diagnosis not present

## 2022-05-07 DIAGNOSIS — Z881 Allergy status to other antibiotic agents status: Secondary | ICD-10-CM | POA: Diagnosis not present

## 2022-05-08 ENCOUNTER — Other Ambulatory Visit: Payer: Self-pay | Admitting: Family Medicine

## 2022-05-12 DIAGNOSIS — M25812 Other specified joint disorders, left shoulder: Secondary | ICD-10-CM | POA: Diagnosis not present

## 2022-05-12 DIAGNOSIS — M79602 Pain in left arm: Secondary | ICD-10-CM | POA: Diagnosis not present

## 2022-05-14 ENCOUNTER — Other Ambulatory Visit: Payer: Self-pay | Admitting: Family Medicine

## 2022-05-14 DIAGNOSIS — I10 Essential (primary) hypertension: Secondary | ICD-10-CM

## 2022-05-19 ENCOUNTER — Ambulatory Visit: Payer: Medicare Other | Admitting: Family Medicine

## 2022-05-19 ENCOUNTER — Ambulatory Visit (INDEPENDENT_AMBULATORY_CARE_PROVIDER_SITE_OTHER): Payer: Medicare Other | Admitting: Family Medicine

## 2022-05-19 ENCOUNTER — Encounter: Payer: Self-pay | Admitting: Family Medicine

## 2022-05-19 VITALS — BP 172/78 | HR 73 | Resp 20 | Ht 59.0 in | Wt 158.1 lb

## 2022-05-19 DIAGNOSIS — I5032 Chronic diastolic (congestive) heart failure: Secondary | ICD-10-CM

## 2022-05-19 DIAGNOSIS — R142 Eructation: Secondary | ICD-10-CM | POA: Diagnosis not present

## 2022-05-19 DIAGNOSIS — I10 Essential (primary) hypertension: Secondary | ICD-10-CM

## 2022-05-19 DIAGNOSIS — F341 Dysthymic disorder: Secondary | ICD-10-CM

## 2022-05-19 DIAGNOSIS — K2100 Gastro-esophageal reflux disease with esophagitis, without bleeding: Secondary | ICD-10-CM

## 2022-05-19 DIAGNOSIS — F418 Other specified anxiety disorders: Secondary | ICD-10-CM

## 2022-05-19 MED ORDER — BUPROPION HCL ER (XL) 150 MG PO TB24: 150.0000 mg | ORAL_TABLET | Freq: Every day | ORAL | 0 refills | Status: DC

## 2022-05-19 MED ORDER — LISINOPRIL 20 MG PO TABS: 20.0000 mg | ORAL_TABLET | Freq: Every day | ORAL | 0 refills | Status: DC

## 2022-05-19 MED ORDER — SERTRALINE HCL 25 MG PO TABS: ORAL_TABLET | ORAL | 0 refills | Status: DC

## 2022-05-19 NOTE — Progress Notes (Signed)
Established Patient Office Visit  Subjective   Patient ID: Nancy Wilson, female    DOB: 1941/03/25  Age: 81 y.o. MRN: [id number]  Chief Complaint  Patient presents with   mood follow up    HPI  Nancy Wilson is here to follow-up today for recent increase in her sertraline she was feeling a little bit more down when I last saw her.  We went up to 100 mg.  She says she does not feel like the medication has been helpful at all.  But she has been taking it consistently.  She has not noticed any negative side effects.  She says she would really like to get off of a lot of the medication that she is taking.  Wants to know if there is any way that we could reduce her medication regimen.  She also reports that she is still belching a lot.  She denies any difficulty swallowing.  She is currently on Protonix 40 mg.  Sometimes she has more sour burps.  She is unfortunately still falling frequently.  Did try B6 for muscle cramps but not helpful.  Abs were normal.        05/19/2022   11:26 AM 04/07/2022   10:42 AM 04/07/2022   10:41 AM  Depression screen PHQ 2/9  Decreased Interest 3 0 0  Down, Depressed, Hopeless 3 0 3  PHQ - 2 Score 6 0 3  Altered sleeping 0  3  Tired, decreased energy 3  3  Change in appetite 0  3  Feeling bad or failure about yourself  3  0  Trouble concentrating 0  0  Moving slowly or fidgety/restless 0  0  Suicidal thoughts 0  2  PHQ-9 Score 12  14  Difficult doing work/chores Not difficult at all        05/19/2022   11:27 AM 04/07/2022   10:42 AM 04/07/2021   11:23 AM 12/03/2020   11:08 AM  GAD 7 : Generalized Anxiety Score  Nervous, Anxious, on Edge 3 0 3 1  Control/stop worrying 3 0 3 0  Worry too much - different things 3 0 3 0  Trouble relaxing 3 0 0 0  Restless 0 0 0 0  Easily annoyed or irritable 0 0 0 0  Afraid - awful might happen 0 0 0 0  Total GAD 7 Score 12 0 9 1  Anxiety Difficulty Not difficult at all  Not difficult at all Not difficult at  all         ROS    Objective:     BP (!) 172/78   Pulse 73   Resp 20   Ht 4\' 11"  (1.499 m)   Wt 158 lb 1.9 oz (71.7 kg)   SpO2 97%   BMI 31.94 kg/m     Physical Exam Vitals and nursing note reviewed.  Constitutional:      Appearance: She is well-developed.  HENT:     Head: Normocephalic and atraumatic.  Cardiovascular:     Rate and Rhythm: Normal rate and regular rhythm.     Heart sounds: Normal heart sounds.  Pulmonary:     Effort: Pulmonary effort is normal.     Breath sounds: Normal breath sounds.  Skin:    General: Skin is warm and dry.  Neurological:     Mental Status: She is alert and oriented to person, place, and time.  Psychiatric:        Behavior: Behavior normal.  No results found for any visits on 05/19/22.     The ASCVD Risk score (Arnett DK, et al., 2019) failed to calculate for the following reasons:   The 2019 ASCVD risk score is only valid for ages 5 to 78    Assessment & Plan:   Problem List Items Addressed This Visit       Cardiovascular and Mediastinum   HYPERTENSION, BENIGN    Pressure is elevated and uncontrolled today this is a little unusual for her.  Normally she is around 130/50 and has been back in July and back in February.  We will plan to have her come back in about 3 weeks to recheck it.  We also discussed possibly reducing her medication so would like to try working on increasing her lisinopril and decreasing her amlodipine to see if this is effective.  We will go ahead and increase lisinopril to 20 mg and have her cut her amlodipine in half.  She like to keep the propranolol because she does feel like it helps with her tremor.      Relevant Medications   lisinopril (ZESTRIL) 20 MG tablet   Heart failure with preserved ejection fraction (HCC)   Relevant Medications   lisinopril (ZESTRIL) 20 MG tablet     Digestive   GERD    Hx of Hiatal hernia. Having GERD and belching.  Not improving with PPI.  Needs further  work-up for other causes such as gastroparesis etc.      Relevant Orders   Ambulatory referral to Gastroenterology     Other   Depression with anxiety - Primary    We will decrease your bupropion to 150 mg.  New prescription sent to pharmacy so we can decrease your dose. I also sent over a new prescription for sertraline so that you can follow the instructions to taper off of it over the next couple of weeks.  And then will start a new medication. When she is tapered off of the medication and also like to see if her blood pressure looks a little bit better.  Also consider one of the newer agents since she has tried multiple medications including Zoloft, Effexor, Wellbutrin, Valium, Cymbalta, Prozac, risperidone, BuSpar, amitriptyline, Celexa, Pristiq  For treatment resistant depression I like to consider a trial of Zyprexa in combination with fluoxetine.      Relevant Medications   buPROPion (WELLBUTRIN XL) 150 MG 24 hr tablet   sertraline (ZOLOFT) 25 MG tablet   Other Visit Diagnoses     ANXIETY DEPRESSION       Relevant Medications   buPROPion (WELLBUTRIN XL) 150 MG 24 hr tablet   sertraline (ZOLOFT) 25 MG tablet   Belching       Relevant Orders   Ambulatory referral to Gastroenterology       Return BP and med changes.    Nani Gasser, MD

## 2022-05-19 NOTE — Assessment & Plan Note (Signed)
We will decrease your bupropion to 150 mg.  New prescription sent to pharmacy so we can decrease your dose. I also sent over a new prescription for sertraline so that you can follow the instructions to taper off of it over the next couple of weeks.  And then will start a new medication. When she is tapered off of the medication and also like to see if her blood pressure looks a little bit better.  Also consider one of the newer agents since she has tried multiple medications including Zoloft, Effexor, Wellbutrin, Valium, Cymbalta, Prozac, risperidone, BuSpar, amitriptyline, Celexa, Pristiq  For treatment resistant depression I like to consider a trial of Zyprexa in combination with fluoxetine.

## 2022-05-19 NOTE — Assessment & Plan Note (Signed)
Pressure is elevated and uncontrolled today this is a little unusual for her.  Normally she is around 130/50 and has been back in July and back in February.  We will plan to have her come back in about 3 weeks to recheck it.  We also discussed possibly reducing her medication so would like to try working on increasing her lisinopril and decreasing her amlodipine to see if this is effective.  We will go ahead and increase lisinopril to 20 mg and have her cut her amlodipine in half.  She like to keep the propranolol because she does feel like it helps with her tremor.

## 2022-05-19 NOTE — Assessment & Plan Note (Signed)
Hx of Hiatal hernia. Having GERD and belching.  Not improving with PPI.  Needs further work-up for other causes such as gastroparesis etc.

## 2022-05-19 NOTE — Patient Instructions (Addendum)
We will decrease your bupropion to 550 mg.  New prescription sent to pharmacy so we can decrease your dose. I also sent over a new prescription for sertraline so that you can follow the instructions to taper off of it over the next couple of weeks.  And then will start a new medication.  I am also going to increase your lisinopril to 20 mg.  Our goal is to work upwards on your dose so that we can get you completely off of the amlodipine if possible.  So I want you to cut your amlodipine in half and just take 5 mg daily.  If your blood pressure looks good when we see you back then we will probably bump the lisinopril again and then have you stop the amlodipine.  But we will have to see how your blood pressure is doing at that point.

## 2022-06-05 ENCOUNTER — Other Ambulatory Visit: Payer: Self-pay | Admitting: Family Medicine

## 2022-06-05 DIAGNOSIS — F341 Dysthymic disorder: Secondary | ICD-10-CM

## 2022-06-09 ENCOUNTER — Other Ambulatory Visit: Payer: Self-pay | Admitting: Family Medicine

## 2022-06-09 DIAGNOSIS — I5032 Chronic diastolic (congestive) heart failure: Secondary | ICD-10-CM

## 2022-06-09 DIAGNOSIS — I10 Essential (primary) hypertension: Secondary | ICD-10-CM

## 2022-06-10 ENCOUNTER — Encounter: Payer: Self-pay | Admitting: Family Medicine

## 2022-06-10 ENCOUNTER — Ambulatory Visit (INDEPENDENT_AMBULATORY_CARE_PROVIDER_SITE_OTHER): Payer: Medicare Other | Admitting: Family Medicine

## 2022-06-10 VITALS — BP 140/63 | HR 84 | Ht 59.0 in | Wt 158.0 lb

## 2022-06-10 DIAGNOSIS — Z23 Encounter for immunization: Secondary | ICD-10-CM | POA: Diagnosis not present

## 2022-06-10 DIAGNOSIS — F418 Other specified anxiety disorders: Secondary | ICD-10-CM | POA: Diagnosis not present

## 2022-06-10 DIAGNOSIS — S0990XA Unspecified injury of head, initial encounter: Secondary | ICD-10-CM | POA: Diagnosis not present

## 2022-06-10 DIAGNOSIS — W19XXXA Unspecified fall, initial encounter: Secondary | ICD-10-CM

## 2022-06-10 DIAGNOSIS — I10 Essential (primary) hypertension: Secondary | ICD-10-CM

## 2022-06-10 MED ORDER — AMLODIPINE BESYLATE 2.5 MG PO TABS: 2.5000 mg | ORAL_TABLET | Freq: Every day | ORAL | 0 refills | Status: DC

## 2022-06-10 MED ORDER — OLANZAPINE 2.5 MG PO TABS: 2.5000 mg | ORAL_TABLET | Freq: Every day | ORAL | 1 refills | Status: DC

## 2022-06-10 NOTE — Progress Notes (Signed)
Pt reports that she fell 2 nights ago and hit the L side of her head and L upper arm. Denies any LOC

## 2022-06-10 NOTE — Progress Notes (Signed)
Established Patient Office Visit  Subjective   Patient ID: Nancy Wilson, female    DOB: July 25, 1941  Age: 81 y.o. MRN: [id number]  No chief complaint on file.   HPI  Anxiety depression-her husband is here with her today.  She reports that sometimes she does miss her medications.  She says she is not taking her Wellbutrin every day just occasionally.  We actually cut her dose back to 150 mg but again I do not think she is actually taking it consistently.  Is also going to taper off the sertraline completely but is still taking a half a tab at bedtime because she feels like it helps her sleep.  Parkinson's-her husband says that she is occasionally missing her tab in the evenings with the carbidopa levodopa.  She fell again since she was last here.  Unfortunately she hit her left upper arm and also hit the back of her head and had a laceration.  She was able to get the bleeding to stop on her own she did not seek emergency care.  She says she is feeling okay today.  She has not had any residual headaches or vision changes or neurologic symptoms.  No vision changes.  Restless leg-she would like to discontinue the ropinirole.    ROS    Objective:     BP (!) 140/63   Pulse 84   Ht 4\' 11"  (1.499 m)   Wt 158 lb (71.7 kg)   SpO2 94%   BMI 31.91 kg/m    Physical Exam Constitutional:      Appearance: She is well-developed.  HENT:     Head: Normocephalic and atraumatic.     Right Ear: External ear normal.     Left Ear: External ear normal.     Nose: Nose normal.  Eyes:     Conjunctiva/sclera: Conjunctivae normal.     Pupils: Pupils are equal, round, and reactive to light.  Neck:     Thyroid: No thyromegaly.  Pulmonary:     Effort: Pulmonary effort is normal.     Breath sounds: No wheezing.  Musculoskeletal:     Cervical back: Neck supple.  Lymphadenopathy:     Cervical: No cervical adenopathy.  Skin:    General: Skin is warm and dry.     Comments: Bruise on the left  forearm laceration on the posterior scalp.  Appears to be healing well.  Neurological:     Mental Status: She is alert and oriented to person, place, and time.      No results found for any visits on 06/10/22.    The ASCVD Risk score (Arnett DK, et al., 2019) failed to calculate for the following reasons:   The 2019 ASCVD risk score is only valid for ages 64 to 46    Assessment & Plan:   Problem List Items Addressed This Visit       Cardiovascular and Mediastinum   HYPERTENSION, BENIGN    Blood pressure is a little borderline elevated today.  We had been trying to make some changes to her medication regimen but she was also very confused about how she was taking her medicine today so I am going to update her medication list and decrease her amlodipine to 2.5 mg I am not sure if she was really splitting the tens or not.  I discussed this with her and her husband so that they could verify their medications and make sure that they have picked up the appropriate ones and  discontinued others.      Relevant Medications   amLODipine (NORVASC) 2.5 MG tablet     Other   Depression with anxiety    She is only taking a half a tab of sertraline so 12.5 mg.  Spoken to discontinue that and try a low-dose of Zyprexa and see if that is helpful if she has any side effects and she just needs to discontinue it she really has not had an impressive response to most SSRIs and SNRIs.  The other option would be to try something newer like Fetzima or Viibryd.  But these will probably be a little bit more costly.  She is already taking the Wellbutrin as needed so we discussed discontinuing it permanently and removing it from the medication list.      Relevant Medications   OLANZapine (ZYPREXA) 2.5 MG tablet   Other Visit Diagnoses     Need for immunization against influenza    -  Primary   Relevant Orders   Flu Vaccine QUAD High Dose(Fluad) (Completed)   Fall, initial encounter       Injury of head,  initial encounter           Then she is not having any residual symptoms at this point.  The laceration on the posterior scalp looks like it is healing no erythema or active drainage.  The bruising on the left lateral arm is a little bit tender on exam.  She has a hematoma.  Health is coming out and giving her physical therapy.  Hopefully they can continue to work on her fall reduction.  We will try to see if we can try to reach out to the nurse and see if they can help with her medications.  Return in about 4 weeks (around 07/08/2022) for Medication changes .    Nani Gasser, MD

## 2022-06-10 NOTE — Patient Instructions (Addendum)
Please make sure that you are taking lisinopril 20 mg. Sending over a new prescription for amlodipine 2.5 mg.  You will discontinue the 10 mg that you have been on. Discontinue the sertraline. Sending over new prescription called olanzapine 2.5 mg.  Okay to take at bedtime.  If you do not like how it makes you feel then please stop it.

## 2022-06-11 NOTE — Assessment & Plan Note (Signed)
Blood pressure is a little borderline elevated today.  We had been trying to make some changes to her medication regimen but she was also very confused about how she was taking her medicine today so I am going to update her medication list and decrease her amlodipine to 2.5 mg I am not sure if she was really splitting the tens or not.  I discussed this with her and her husband so that they could verify their medications and make sure that they have picked up the appropriate ones and discontinued others.

## 2022-06-11 NOTE — Assessment & Plan Note (Signed)
She is only taking a half a tab of sertraline so 12.5 mg.  Spoken to discontinue that and try a low-dose of Zyprexa and see if that is helpful if she has any side effects and she just needs to discontinue it she really has not had an impressive response to most SSRIs and SNRIs.  The other option would be to try something newer like Fetzima or Viibryd.  But these will probably be a little bit more costly.  She is already taking the Wellbutrin as needed so we discussed discontinuing it permanently and removing it from the medication list.

## 2022-06-20 ENCOUNTER — Other Ambulatory Visit: Payer: Self-pay | Admitting: Family Medicine

## 2022-06-21 DIAGNOSIS — R1084 Generalized abdominal pain: Secondary | ICD-10-CM | POA: Diagnosis not present

## 2022-06-21 DIAGNOSIS — K219 Gastro-esophageal reflux disease without esophagitis: Secondary | ICD-10-CM | POA: Diagnosis not present

## 2022-06-28 DIAGNOSIS — K224 Dyskinesia of esophagus: Secondary | ICD-10-CM | POA: Diagnosis not present

## 2022-06-28 DIAGNOSIS — R109 Unspecified abdominal pain: Secondary | ICD-10-CM | POA: Diagnosis not present

## 2022-06-28 DIAGNOSIS — K219 Gastro-esophageal reflux disease without esophagitis: Secondary | ICD-10-CM | POA: Diagnosis not present

## 2022-07-02 ENCOUNTER — Other Ambulatory Visit: Payer: Self-pay | Admitting: Family Medicine

## 2022-07-02 DIAGNOSIS — E785 Hyperlipidemia, unspecified: Secondary | ICD-10-CM

## 2022-07-03 ENCOUNTER — Other Ambulatory Visit: Payer: Self-pay | Admitting: Family Medicine

## 2022-07-03 DIAGNOSIS — F418 Other specified anxiety disorders: Secondary | ICD-10-CM

## 2022-07-08 ENCOUNTER — Encounter: Payer: Self-pay | Admitting: Family Medicine

## 2022-07-08 ENCOUNTER — Ambulatory Visit (INDEPENDENT_AMBULATORY_CARE_PROVIDER_SITE_OTHER): Payer: Medicare Other | Admitting: Family Medicine

## 2022-07-08 VITALS — BP 144/71 | HR 80 | Ht 59.0 in | Wt 158.0 lb

## 2022-07-08 DIAGNOSIS — F418 Other specified anxiety disorders: Secondary | ICD-10-CM

## 2022-07-08 DIAGNOSIS — J029 Acute pharyngitis, unspecified: Secondary | ICD-10-CM | POA: Diagnosis not present

## 2022-07-08 DIAGNOSIS — R053 Chronic cough: Secondary | ICD-10-CM | POA: Diagnosis not present

## 2022-07-08 DIAGNOSIS — M25512 Pain in left shoulder: Secondary | ICD-10-CM | POA: Diagnosis not present

## 2022-07-08 DIAGNOSIS — I10 Essential (primary) hypertension: Secondary | ICD-10-CM

## 2022-07-08 DIAGNOSIS — R258 Other abnormal involuntary movements: Secondary | ICD-10-CM | POA: Diagnosis not present

## 2022-07-08 DIAGNOSIS — G319 Degenerative disease of nervous system, unspecified: Secondary | ICD-10-CM

## 2022-07-08 DIAGNOSIS — R252 Cramp and spasm: Secondary | ICD-10-CM | POA: Diagnosis not present

## 2022-07-08 DIAGNOSIS — E785 Hyperlipidemia, unspecified: Secondary | ICD-10-CM

## 2022-07-08 DIAGNOSIS — R296 Repeated falls: Secondary | ICD-10-CM | POA: Diagnosis not present

## 2022-07-08 MED ORDER — VILAZODONE HCL 10 MG PO TABS: ORAL_TABLET | ORAL | 0 refills | Status: DC

## 2022-07-08 MED ORDER — AMLODIPINE BESYLATE 2.5 MG PO TABS: 2.5000 mg | ORAL_TABLET | Freq: Every day | ORAL | 3 refills | Status: DC

## 2022-07-08 MED ORDER — ATORVASTATIN CALCIUM 20 MG PO TABS: 20.0000 mg | ORAL_TABLET | Freq: Every day | ORAL | 3 refills | Status: DC

## 2022-07-08 NOTE — Progress Notes (Signed)
Pt stated that she did pick up the Zyprexa 2.5 mg from the pharmacy however, she did not take it. She read the patient drug warnings and it stated that it could cause Heart attacks and Stroke and she didn't want to start taking this medication due to these warnings.   Pt was seen by Dr. Howell Rucks 06/21/2022 and was advised to d/c the Pantoprazole 40 mg due to not getting any improvement. He prescribed her Omeprazole 40 mg and she will need to f/u in 2 months or sooner if her sxs worsen.

## 2022-07-08 NOTE — Assessment & Plan Note (Signed)
He does get some benefit from the carbidopa levodopa.  She has a follow-up with Dr. Everette Rank in December.

## 2022-07-08 NOTE — Progress Notes (Signed)
Established Patient Office Visit  Subjective   Patient ID: Nancy Wilson, female    DOB: 04-15-1941  Age: 81 y.o. MRN: [id number]  Chief Complaint  Patient presents with   Follow-up    Follow up medication change     HPI F/U cramping of hands -she says she tried the vitamin B6 but it really was not helpful so now she plans on trying magnesium and another type of supplement but she is on TV.  Follow-up Parkinson's disease-she still having frequent falls in fact she fell last night.  She said she was trying to go backwards and fell backwards and injured her left wrist.  She did not ice it.  She also reports that she has had a cough since July.  No sick contacts.  No fevers or chills.  No sinus congestion.  She says occasionally she gets sputum she feels like it is deeper into her mid chest area.  Reports the cough is worse at night.  She also reports a sore throat she says it sometimes is uncomfortable when she swallows and she has little to pain on the right side this has been going on for quite some time as well.  She also recently saw GI and they did a double contrast upper GI series that just showed mild nonspecific esophageal dysmotility with occasional disruption of the primary stripping wave.  No mass or strictures noted.  She was also noted to have a large hiatal hernia.  They discontinued pantoprazole and switch to omeprazole instead.    ROS    Objective:     BP (!) 144/71   Pulse 80   Ht 4\' 11"  (1.499 m)   Wt 158 lb (71.7 kg)   SpO2 96%   BMI 31.91 kg/m    Physical Exam Vitals and nursing note reviewed.  Constitutional:      Appearance: She is well-developed.  HENT:     Head: Normocephalic and atraumatic.  Cardiovascular:     Rate and Rhythm: Normal rate and regular rhythm.     Heart sounds: Normal heart sounds.  Pulmonary:     Effort: Pulmonary effort is normal.     Breath sounds: Normal breath sounds.  Skin:    General: Skin is warm and dry.   Neurological:     Mental Status: She is alert and oriented to person, place, and time.  Psychiatric:        Behavior: Behavior normal.      No results found for any visits on 07/08/22.    The ASCVD Risk score (Arnett DK, et al., 2019) failed to calculate for the following reasons:   The 2019 ASCVD risk score is only valid for ages 72 to 68    Assessment & Plan:   Problem List Items Addressed This Visit       Cardiovascular and Mediastinum   HYPERTENSION, BENIGN    Pressure is a little borderline elevated today.  We will keep an eye on that for now.      Relevant Medications   propranolol (INDERAL) 40 MG tablet   atorvastatin (LIPITOR) 20 MG tablet   amLODipine (NORVASC) 2.5 MG tablet     Nervous and Auditory   Cerebral atrophy (HCC)     Other   Muscle cramping    B6 was not effective.  She plans on trying magnesium and another over-the-counter supplement that she saw on TV.      Hyperlipidemia   Relevant Medications   propranolol (INDERAL)  40 MG tablet   atorvastatin (LIPITOR) 20 MG tablet   amLODipine (NORVASC) 2.5 MG tablet   Frequent falls   Depression with anxiety    Discussed options. She didn't want to try the zyprexa after reading the package insert. Will try Viibryd. F/U in 1 months.       Relevant Medications   Vilazodone HCl (VIIBRYD) 10 MG TABS   Bradykinesia    He does get some benefit from the carbidopa levodopa.  She has a follow-up with Dr. Everette Rank in December.      Relevant Medications   propranolol (INDERAL) 40 MG tablet   Other Visit Diagnoses     Acute pain of left shoulder    -  Primary   Chronic cough       Relevant Orders   DG Chest 2 View   Sore throat          Persistent cough for most 3 months-we will get a chest x-ray today.  Lung exam is clear.  She is also had a persistently irritated and sore throat.  I think the next step would be an ENT referral for the throat.  But I want to see what the chest x-ray looks like  first.  I spent 40 minutes on the day of the encounter to include pre-visit record review, face-to-face time with the patient and post visit ordering of test.   Return in about 5 weeks (around 08/12/2022) for New start medication.    Beatrice Lecher, MD

## 2022-07-08 NOTE — Assessment & Plan Note (Signed)
B6 was not effective.  She plans on trying magnesium and another over-the-counter supplement that she saw on TV.

## 2022-07-08 NOTE — Assessment & Plan Note (Signed)
Discussed options. She didn't want to try the zyprexa after reading the package insert. Will try Viibryd. F/U in 1 months.

## 2022-07-08 NOTE — Assessment & Plan Note (Signed)
Pressure is a little borderline elevated today.  We will keep an eye on that for now.

## 2022-07-19 ENCOUNTER — Other Ambulatory Visit: Payer: Self-pay | Admitting: Family Medicine

## 2022-07-19 DIAGNOSIS — F418 Other specified anxiety disorders: Secondary | ICD-10-CM

## 2022-07-19 NOTE — Telephone Encounter (Signed)
Patient called stated needs Zoloft 2.5.

## 2022-07-20 ENCOUNTER — Other Ambulatory Visit: Payer: Self-pay | Admitting: Family Medicine

## 2022-07-20 DIAGNOSIS — N3281 Overactive bladder: Secondary | ICD-10-CM

## 2022-07-21 NOTE — Telephone Encounter (Signed)
She is supposed to start the vilazodone.  Is she wanting to stay on the zoloft instead? Not sure of pharmacy

## 2022-07-21 NOTE — Telephone Encounter (Signed)
Is she talking about Zyprexa 2.5 mg?  I think the last time she was here she said she did not actually start it so maybe she has started it?

## 2022-07-22 IMAGING — MG MM DIGITAL SCREENING BILAT W/ TOMO AND CAD
6 of 10 series · 6 of 30 positions shown · non-contrast
Comparison: Previous exam(s).

CLINICAL DATA: Screening.

EXAM:
DIGITAL SCREENING BILATERAL MAMMOGRAM WITH TOMOSYNTHESIS AND CAD
TECHNIQUE: Bilateral screening digital craniocaudal and mediolateral oblique
mammograms were obtained. Bilateral screening digital breast
tomosynthesis was performed. The images were evaluated with
computer-aided detection.

[R CC synth-2D]
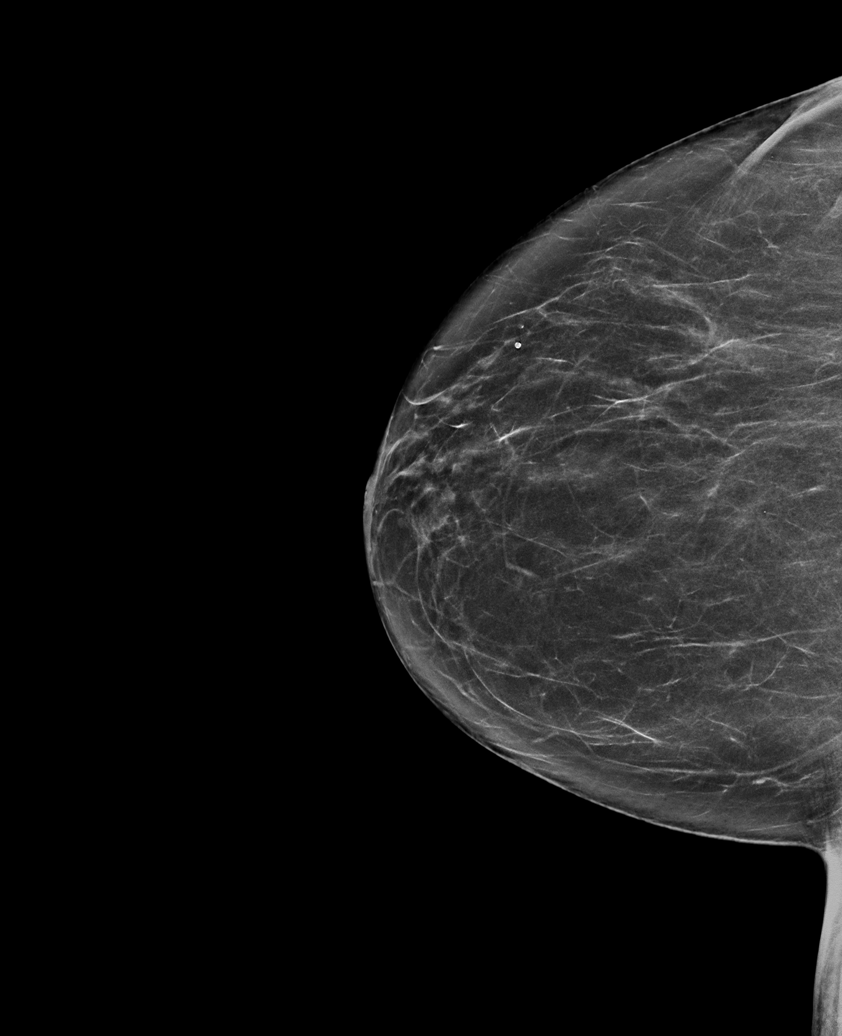

[L MLO synth-2D (1 of 2)]
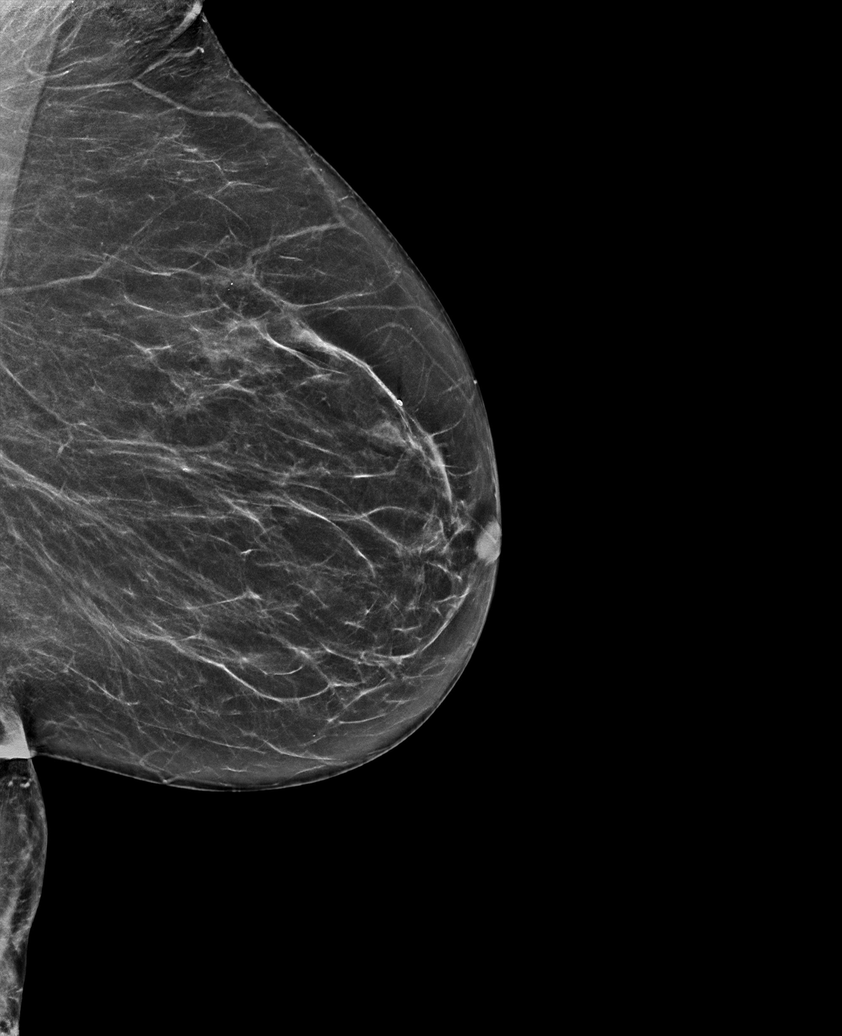

[L MLO synth-2D (2 of 2)]
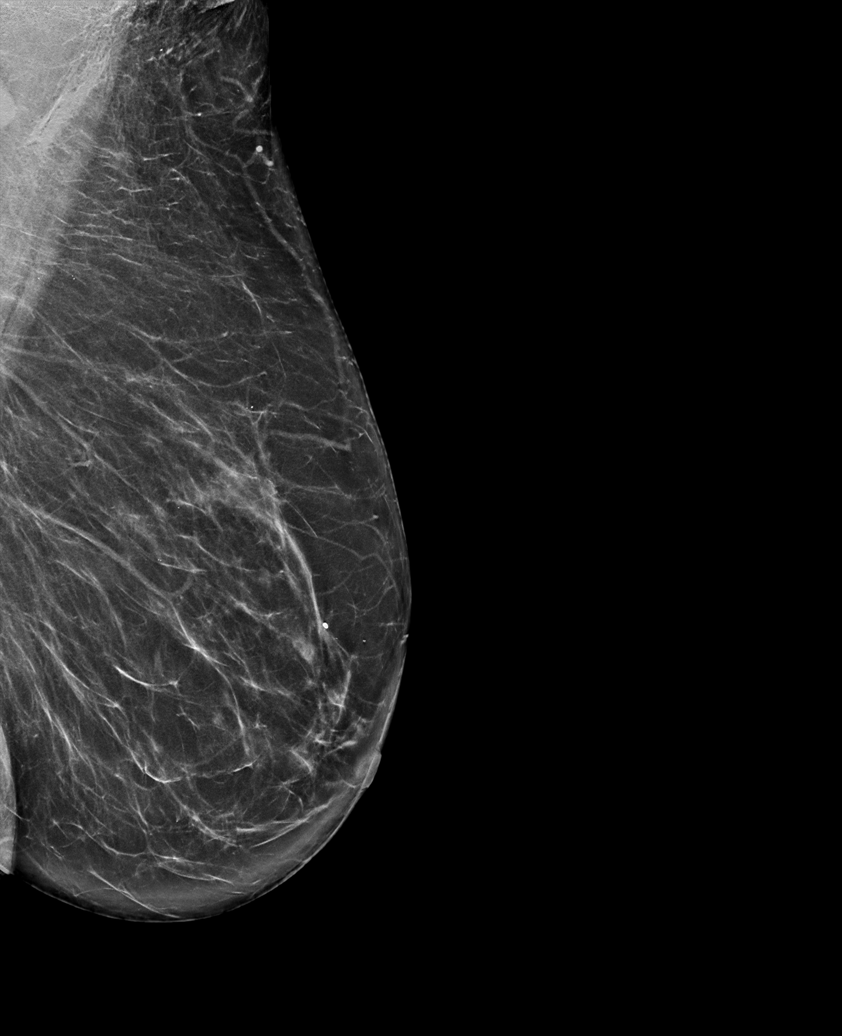

[L CC synth-2D]
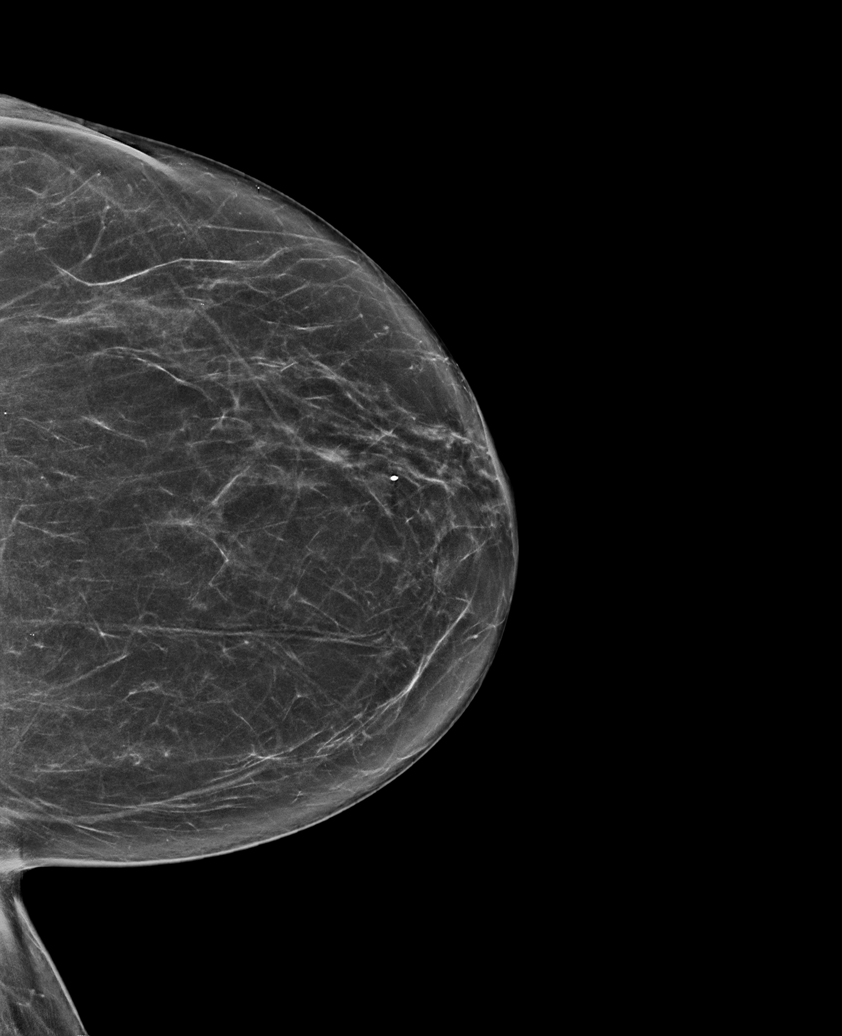

[R MLO synth-2D]
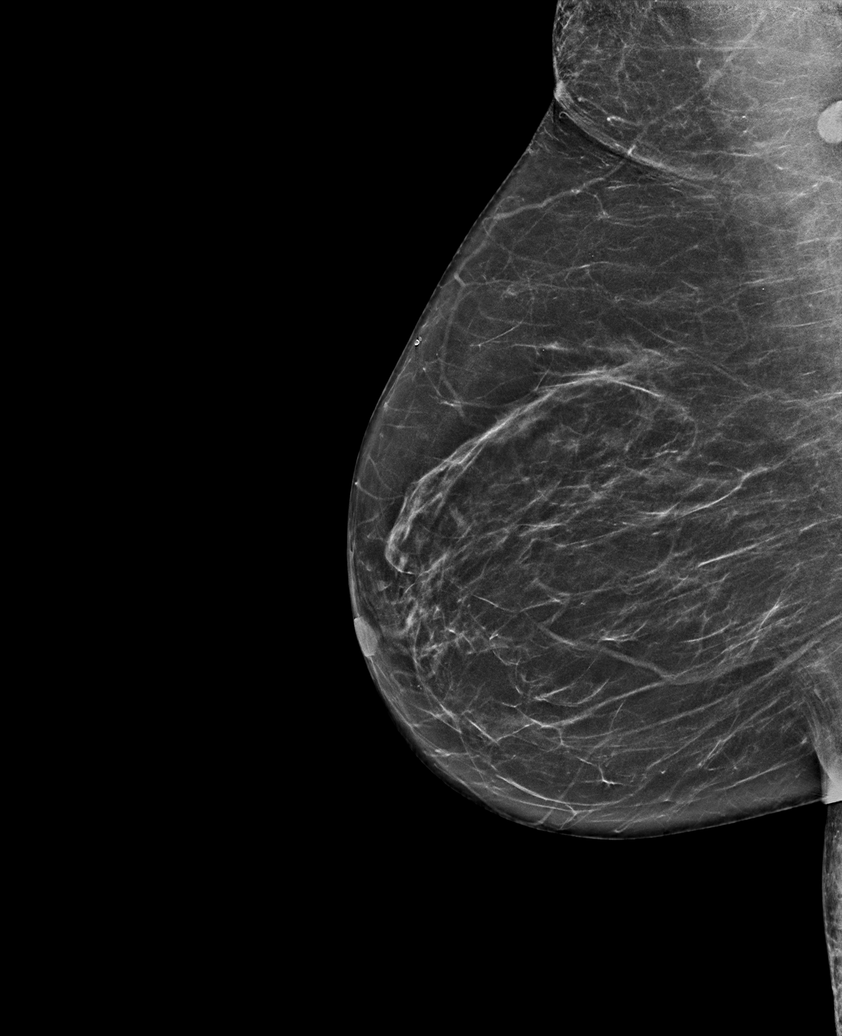

[L MLO tomo · tomo slice 38/75.0]
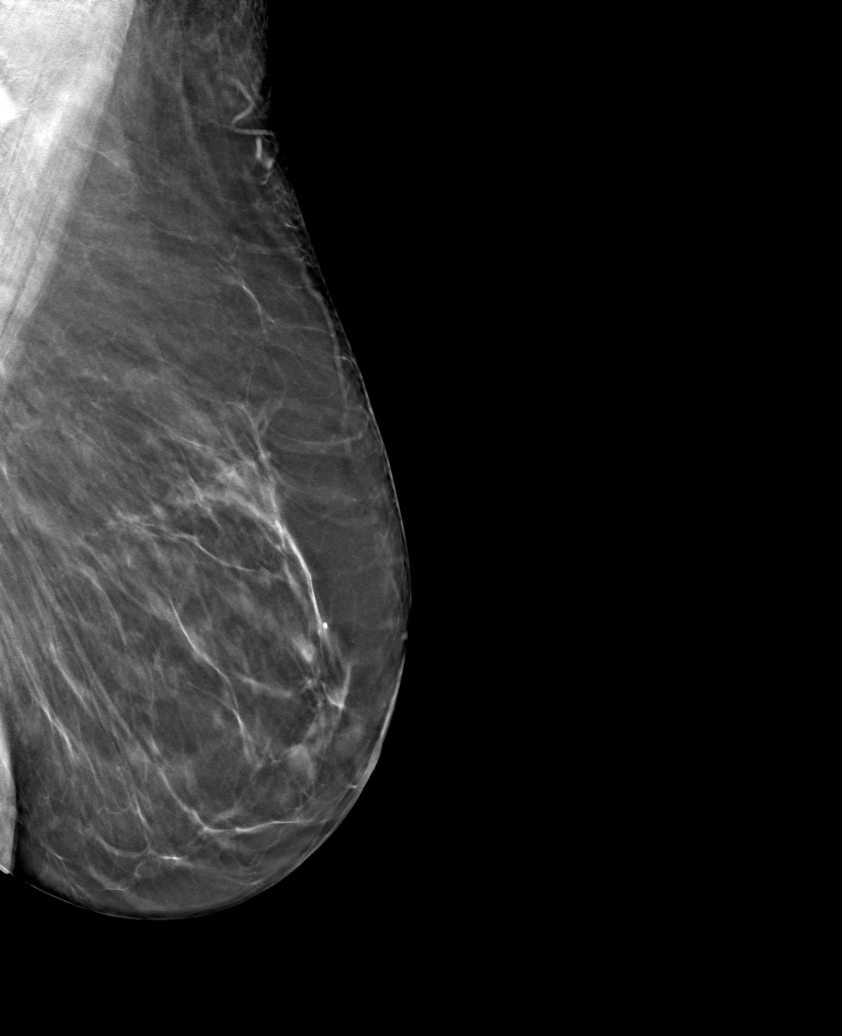

[6 of 30 positions shown; findings below may reference images not displayed]

ACR Breast Density Category b: There are scattered areas of
fibroglandular density.
FINDINGS: There are no findings suspicious for malignancy.
IMPRESSION: No mammographic evidence of malignancy. A result letter of this
screening mammogram will be mailed directly to the patient.

RECOMMENDATION:
Screening mammogram in one year. (Code:51-O-LD2)

BI-RADS CATEGORY  1: Negative.

## 2022-07-22 MED ORDER — SERTRALINE HCL 25 MG PO TABS: 25.0000 mg | ORAL_TABLET | Freq: Every day | ORAL | 0 refills | Status: DC

## 2022-07-22 MED ORDER — VILAZODONE HCL 10 MG PO TABS: 10.0000 mg | ORAL_TABLET | Freq: Every day | ORAL | 0 refills | Status: DC

## 2022-07-22 MED ORDER — VILAZODONE HCL 20 MG PO TABS: 20.0000 mg | ORAL_TABLET | Freq: Every day | ORAL | 0 refills | Status: DC

## 2022-07-22 NOTE — Telephone Encounter (Signed)
Okay, thank you this is the first time hearing of them denying to fill the Viibryd I have not received any faxes or calls regarding this.  I did correct and separate the prescription for the starter dose.  And sent that to [facility].  I sent a 30-day supply locally for the sertraline so that she can pick that up.  Meds ordered this encounter  Medications   sertraline (ZOLOFT) 25 MG tablet    Sig: Take 1 tablet (25 mg total) by mouth daily.    Dispense:  30 tablet    Refill:  0   Vilazodone HCl (VIIBRYD) 10 MG TABS    Sig: Take 1 tablet (10 mg total) by mouth daily for 7 days.    Dispense:  7 tablet    Refill:  0   Vilazodone HCl (VIIBRYD) 20 MG TABS    Sig: Take 1 tablet (20 mg total) by mouth daily. Pleases start after complete 7 days of 10mg     Dispense:  90 tablet    Refill:  0

## 2022-07-22 NOTE — Telephone Encounter (Signed)
Patient informed. 

## 2022-07-22 NOTE — Telephone Encounter (Signed)
Attemtped call to patient. No answer no voice mail. Could not leave a voice mail message.

## 2022-07-26 ENCOUNTER — Telehealth: Payer: Self-pay

## 2022-07-26 NOTE — Telephone Encounter (Addendum)
Initiated Prior authorization ZES:PQZRAQTMAU HCl 20MG  tablets Via: Covermymeds Case/Key:[id number] Status: n/a as of 07/26/22 Reason:This medication or product is on your plan's list of covered drugs. Prior authorization is not required at this time. If your pharmacy has questions regarding the processing of your prescription, please have them call the [facility] pharmacy help desk at [phone]. **Please note: This request was submitted electronically. Formulary lowering, tiering exception, cost reduction and/or pre-benefit determination review (including prospective Medicare hospice reviews) requests cannot be requested using this method of submission. Providers contact us at [phone] for further assistance. Notified Pt via: pt does not have Mychart, Md notified   Initiated Prior authorization KAJ:GOTLXBWIOM HCl 10MG  tablets Via: Covermymeds Case/Key:[id number] Status: n/a as of 07/26/22 Reason:This medication or product is on your plan's list of covered drugs. Prior authorization is not required at this time. If your pharmacy has questions regarding the processing of your prescription, please have them call the [facility] pharmacy help desk at [phone]. **Please note: This request was submitted electronically. Formulary lowering, tiering exception, cost reduction and/or pre-benefit determination review (including prospective Medicare hospice reviews) requests cannot be requested using this method of submission. Providers contact us at [phone] for further assistance. Notified Pt via: pt does not have Mychart, MD notified

## 2022-08-09 ENCOUNTER — Telehealth: Payer: Self-pay | Admitting: General Practice

## 2022-08-09 NOTE — Telephone Encounter (Signed)
Left message for patient to schedule medicare wellness visit.  

## 2022-08-17 DIAGNOSIS — R296 Repeated falls: Secondary | ICD-10-CM | POA: Diagnosis not present

## 2022-08-17 DIAGNOSIS — R252 Cramp and spasm: Secondary | ICD-10-CM | POA: Diagnosis not present

## 2022-08-17 DIAGNOSIS — R413 Other amnesia: Secondary | ICD-10-CM | POA: Diagnosis not present

## 2022-08-18 DIAGNOSIS — K219 Gastro-esophageal reflux disease without esophagitis: Secondary | ICD-10-CM | POA: Diagnosis not present

## 2022-08-18 DIAGNOSIS — R1084 Generalized abdominal pain: Secondary | ICD-10-CM | POA: Diagnosis not present

## 2022-08-18 DIAGNOSIS — Z9289 Personal history of other medical treatment: Secondary | ICD-10-CM | POA: Diagnosis not present

## 2022-08-18 DIAGNOSIS — R1310 Dysphagia, unspecified: Secondary | ICD-10-CM | POA: Diagnosis not present

## 2022-08-20 DIAGNOSIS — Z79899 Other long term (current) drug therapy: Secondary | ICD-10-CM | POA: Diagnosis not present

## 2022-08-20 DIAGNOSIS — K219 Gastro-esophageal reflux disease without esophagitis: Secondary | ICD-10-CM | POA: Diagnosis not present

## 2022-08-20 DIAGNOSIS — S0101XA Laceration without foreign body of scalp, initial encounter: Secondary | ICD-10-CM | POA: Diagnosis not present

## 2022-08-20 DIAGNOSIS — E785 Hyperlipidemia, unspecified: Secondary | ICD-10-CM | POA: Diagnosis not present

## 2022-08-20 DIAGNOSIS — Z888 Allergy status to other drugs, medicaments and biological substances status: Secondary | ICD-10-CM | POA: Diagnosis not present

## 2022-08-20 DIAGNOSIS — Z7982 Long term (current) use of aspirin: Secondary | ICD-10-CM | POA: Diagnosis not present

## 2022-08-20 DIAGNOSIS — I10 Essential (primary) hypertension: Secondary | ICD-10-CM | POA: Diagnosis not present

## 2022-08-20 DIAGNOSIS — S0083XA Contusion of other part of head, initial encounter: Secondary | ICD-10-CM | POA: Diagnosis not present

## 2022-08-20 DIAGNOSIS — Z043 Encounter for examination and observation following other accident: Secondary | ICD-10-CM | POA: Diagnosis not present

## 2022-08-20 DIAGNOSIS — Z886 Allergy status to analgesic agent status: Secondary | ICD-10-CM | POA: Diagnosis not present

## 2022-08-20 DIAGNOSIS — Z9089 Acquired absence of other organs: Secondary | ICD-10-CM | POA: Diagnosis not present

## 2022-08-20 DIAGNOSIS — Z23 Encounter for immunization: Secondary | ICD-10-CM | POA: Diagnosis not present

## 2022-08-20 DIAGNOSIS — M79642 Pain in left hand: Secondary | ICD-10-CM | POA: Diagnosis not present

## 2022-08-20 DIAGNOSIS — W01198A Fall on same level from slipping, tripping and stumbling with subsequent striking against other object, initial encounter: Secondary | ICD-10-CM | POA: Diagnosis not present

## 2022-08-21 DIAGNOSIS — M79642 Pain in left hand: Secondary | ICD-10-CM | POA: Diagnosis not present

## 2022-08-21 DIAGNOSIS — S0083XA Contusion of other part of head, initial encounter: Secondary | ICD-10-CM | POA: Diagnosis not present

## 2022-08-21 DIAGNOSIS — Z043 Encounter for examination and observation following other accident: Secondary | ICD-10-CM | POA: Diagnosis not present

## 2022-08-26 ENCOUNTER — Ambulatory Visit (INDEPENDENT_AMBULATORY_CARE_PROVIDER_SITE_OTHER): Payer: Medicare Other | Admitting: Family Medicine

## 2022-08-26 ENCOUNTER — Encounter: Payer: Self-pay | Admitting: Family Medicine

## 2022-08-26 VITALS — BP 147/73 | HR 72 | Ht 59.0 in | Wt 161.0 lb

## 2022-08-26 DIAGNOSIS — S0101XA Laceration without foreign body of scalp, initial encounter: Secondary | ICD-10-CM | POA: Diagnosis not present

## 2022-08-26 DIAGNOSIS — Z4802 Encounter for removal of sutures: Secondary | ICD-10-CM | POA: Diagnosis not present

## 2022-08-26 DIAGNOSIS — I10 Essential (primary) hypertension: Secondary | ICD-10-CM | POA: Diagnosis not present

## 2022-08-26 DIAGNOSIS — F418 Other specified anxiety disorders: Secondary | ICD-10-CM | POA: Diagnosis not present

## 2022-08-26 NOTE — Assessment & Plan Note (Signed)
Far tolerating Viibryd well without any side effects or problems.  PHQ-9 score was 0 today which is improved from previous of 12.  So for now we will continue with current regimen and stay at a low dose.  Will follow back up in about 3 months.     08/26/2022   11:29 AM 05/19/2022   11:26 AM 04/07/2022   10:42 AM  PHQ9 SCORE ONLY  PHQ-9 Total Score 0 12 0

## 2022-08-26 NOTE — Assessment & Plan Note (Signed)
Pressure is up a little bit today repeat was much better.  She is actually not taking her lisinopril and amlodipine.  She thought the Toniann Ket was to be in place of her blood pressure medication.  Please restart the medication.  I might have her just restart with the lisinopril and hold the amlodipine and then we can recheck her blood pressure in a couple of weeks.

## 2022-08-26 NOTE — Patient Instructions (Signed)
Please restart your lisinopril and amlodipine.

## 2022-08-26 NOTE — Progress Notes (Signed)
Established Patient Office Visit  Subjective   Patient ID: Nancy Wilson, female    DOB: 08-Sep-1941  Age: 81 y.o. MRN: [id number]  Chief Complaint  Patient presents with   Follow-up    viibryd    HPI  1 month f/u for depression with anxiety-she has been tolerating the Viibryd well without any side effects or problems.  More pressing issue is that she actually fell recently she does not remember the injury specifically but she was in the bathroom when it happened.  Her husband had taken the dogs out for a walk and when he came back found her in the bathroom she had hit the back of her head and there was a laceration.  She also injured the dorsum of her left hand.  They think she may have fallen off of the toilet.  She did go to the emergency department.  CT of the head and neck were negative for any acute injury.  She does have some mild anterior listhesis of C3 over C4 by about 2 to 3 mm.  X-ray of the left hand negative for fracture.     ROS    Objective:     BP (!) 147/73   Pulse 72   Ht 4\' 11"  (1.499 m)   Wt 161 lb (73 kg)   SpO2 96%   BMI 32.52 kg/m    Physical Exam Vitals and nursing note reviewed.  Constitutional:      Appearance: She is well-developed.  HENT:     Head: Normocephalic and atraumatic.  Cardiovascular:     Rate and Rhythm: Normal rate and regular rhythm.     Heart sounds: Normal heart sounds.  Pulmonary:     Effort: Pulmonary effort is normal.     Breath sounds: Normal breath sounds.  Musculoskeletal:     Comments: Rash on the posterior Scalp looks like it is healing.  5 staples easily removed.  Tolerated procedure well.  She has significant bruising and swelling on the dorsum of the left hand but has normal use of her fingers and wrist.  Skin:    General: Skin is warm and dry.  Neurological:     Mental Status: She is alert and oriented to person, place, and time.  Psychiatric:        Behavior: Behavior normal.      No results found  for any visits on 08/26/22.    The ASCVD Risk score (Arnett DK, et al., 2019) failed to calculate for the following reasons:   The 2019 ASCVD risk score is only valid for ages 23 to 42    Assessment & Plan:   Problem List Items Addressed This Visit       Cardiovascular and Mediastinum   HYPERTENSION, BENIGN - Primary    Pressure is up a little bit today repeat was much better.  She is actually not taking her lisinopril and amlodipine.  She thought the 76 was to be in place of her blood pressure medication.  Please restart the medication.  I might have her just restart with the lisinopril and hold the amlodipine and then we can recheck her blood pressure in a couple of weeks.        Other   Depression with anxiety    Far tolerating Viibryd well without any side effects or problems.  PHQ-9 score was 0 today which is improved from previous of 12.  So for now we will continue with current regimen and stay  at a low dose.  Will follow back up in about 3 months.     08/26/2022   11:29 AM 05/19/2022   11:26 AM 04/07/2022   10:42 AM  PHQ9 SCORE ONLY  PHQ-9 Total Score 0 12 0         Other Visit Diagnoses     Visit for suture removal       Laceration of scalp without foreign body, initial encounter           Laceration  of scalp seems to be healing well.  Sutures removed easily today patient tolerated well.  Avoid washing her scalp for the next 2 days and then okay to take a shower just not scrubbing aggressively at that area to.  Try to leave the scab intact.  Return in about 2 weeks (around 09/09/2022) for Nurse visit for BP.    Nani Gasser, MD

## 2022-08-30 ENCOUNTER — Ambulatory Visit
Admission: EM | Admit: 2022-08-30 | Discharge: 2022-08-30 | Disposition: A | Discharge status: Home / Self Care | Payer: Medicare Other

## 2022-08-30 DIAGNOSIS — Z885 Allergy status to narcotic agent status: Secondary | ICD-10-CM | POA: Diagnosis not present

## 2022-08-30 DIAGNOSIS — G20A1 Parkinson's disease without dyskinesia, without mention of fluctuations: Secondary | ICD-10-CM | POA: Diagnosis not present

## 2022-08-30 DIAGNOSIS — S0101XA Laceration without foreign body of scalp, initial encounter: Secondary | ICD-10-CM | POA: Diagnosis not present

## 2022-08-30 DIAGNOSIS — S0990XA Unspecified injury of head, initial encounter: Secondary | ICD-10-CM | POA: Diagnosis not present

## 2022-08-30 DIAGNOSIS — W01198A Fall on same level from slipping, tripping and stumbling with subsequent striking against other object, initial encounter: Secondary | ICD-10-CM | POA: Diagnosis not present

## 2022-08-30 DIAGNOSIS — S01411A Laceration without foreign body of right cheek and temporomandibular area, initial encounter: Secondary | ICD-10-CM | POA: Diagnosis not present

## 2022-08-30 DIAGNOSIS — Z880 Allergy status to penicillin: Secondary | ICD-10-CM | POA: Diagnosis not present

## 2022-08-30 DIAGNOSIS — E785 Hyperlipidemia, unspecified: Secondary | ICD-10-CM | POA: Diagnosis not present

## 2022-08-30 DIAGNOSIS — R296 Repeated falls: Secondary | ICD-10-CM | POA: Diagnosis not present

## 2022-08-30 DIAGNOSIS — I10 Essential (primary) hypertension: Secondary | ICD-10-CM | POA: Diagnosis not present

## 2022-09-09 ENCOUNTER — Ambulatory Visit (INDEPENDENT_AMBULATORY_CARE_PROVIDER_SITE_OTHER): Payer: Medicare Other | Admitting: Family Medicine

## 2022-09-09 VITALS — BP 157/66 | HR 80 | Ht 59.0 in | Wt 161.0 lb

## 2022-09-09 DIAGNOSIS — Z4802 Encounter for removal of sutures: Secondary | ICD-10-CM | POA: Diagnosis not present

## 2022-09-09 DIAGNOSIS — I10 Essential (primary) hypertension: Secondary | ICD-10-CM | POA: Diagnosis not present

## 2022-09-09 DIAGNOSIS — I5032 Chronic diastolic (congestive) heart failure: Secondary | ICD-10-CM | POA: Diagnosis not present

## 2022-09-09 MED ORDER — LISINOPRIL 40 MG PO TABS: 40.0000 mg | ORAL_TABLET | Freq: Every day | ORAL | 1 refills | Status: DC

## 2022-09-09 NOTE — Progress Notes (Signed)
   Acute Office Visit  Subjective:     Patient ID: Nancy Wilson, female    DOB: 04-14-1941, 81 y.o.   MRN: [id number]  Chief Complaint  Patient presents with   Hypertension    HPI Patient is in today for blood pressure check and suture removal.  Evidently she had another fall on December 12 and suffered a laceration to the top of her right scalp and hit her face.  She says her face feels better it is no longer painful but she still has some bruising present and it has been 10 days so she needs the sutures removed.  She had 4 placed.  ROS      Objective:    BP (!) 157/66   Pulse 80   Ht 4\' 11"  (1.499 m)   Wt 161 lb (73 kg)   SpO2 100%   BMI 32.52 kg/m    Physical Exam  No results found for any visits on 09/09/22.      Assessment & Plan:   Problem List Items Addressed This Visit       Cardiovascular and Mediastinum   HYPERTENSION, BENIGN - Primary    Pete blood pressure still elevated today we were having her follow-up because of previous elevated blood pressures.  Inrease lisinopril to 40 mg and follow-up in 2 to 3 weeks for repeat check.  I do not be cautious about overmedicating her and causing any increased risk for falls.      Relevant Medications   lisinopril (ZESTRIL) 40 MG tablet   Heart failure with preserved ejection fraction (HCC)   Relevant Medications   lisinopril (ZESTRIL) 40 MG tablet   Other Visit Diagnoses     Visit for suture removal           Sutures removed without complication.  Meds ordered this encounter  Medications   lisinopril (ZESTRIL) 40 MG tablet    Sig: Take 1 tablet (40 mg total) by mouth daily.    Dispense:  30 tablet    Refill:  1    Return in about 2 weeks (around 09/23/2022).  11/22/2022, MD

## 2022-09-09 NOTE — Progress Notes (Signed)
   Subjective:    Patient ID: Nancy Wilson, female    DOB: Sep 03, 1941, 81 y.o.   MRN: [id number]  HPI Pt here for bp check, pt denies trouble sleeping, palpitations or medication problems.    Review of Systems     Objective:   Physical Exam        Assessment & Plan:  Pts bp today was 157/66, p 80, pt advised to increase lisinopril to 40mg  and return for a bp check in 2 - 3 weeks.

## 2022-09-09 NOTE — Assessment & Plan Note (Signed)
Nancy Wilson blood pressure still elevated today we were having her follow-up because of previous elevated blood pressures.  Inrease lisinopril to 40 mg and follow-up in 2 to 3 weeks for repeat check.  I do not be cautious about overmedicating her and causing any increased risk for falls.

## 2022-09-23 ENCOUNTER — Other Ambulatory Visit: Payer: Self-pay | Admitting: Family Medicine

## 2022-09-23 DIAGNOSIS — I10 Essential (primary) hypertension: Secondary | ICD-10-CM

## 2022-09-23 DIAGNOSIS — I5032 Chronic diastolic (congestive) heart failure: Secondary | ICD-10-CM

## 2022-09-27 ENCOUNTER — Other Ambulatory Visit: Payer: Self-pay | Admitting: Family Medicine

## 2022-09-27 DIAGNOSIS — F341 Dysthymic disorder: Secondary | ICD-10-CM

## 2022-09-27 DIAGNOSIS — R296 Repeated falls: Secondary | ICD-10-CM | POA: Diagnosis not present

## 2022-09-27 DIAGNOSIS — R258 Other abnormal involuntary movements: Secondary | ICD-10-CM

## 2022-09-27 DIAGNOSIS — R252 Cramp and spasm: Secondary | ICD-10-CM | POA: Diagnosis not present

## 2022-09-27 DIAGNOSIS — R413 Other amnesia: Secondary | ICD-10-CM | POA: Diagnosis not present

## 2022-09-27 DIAGNOSIS — Z79899 Other long term (current) drug therapy: Secondary | ICD-10-CM | POA: Diagnosis not present

## 2022-09-29 DIAGNOSIS — M4802 Spinal stenosis, cervical region: Secondary | ICD-10-CM | POA: Diagnosis not present

## 2022-10-12 ENCOUNTER — Ambulatory Visit: Payer: Medicare Other | Admitting: Family Medicine

## 2022-10-18 DIAGNOSIS — G20A1 Parkinson's disease without dyskinesia, without mention of fluctuations: Secondary | ICD-10-CM | POA: Diagnosis not present

## 2022-10-18 DIAGNOSIS — I10 Essential (primary) hypertension: Secondary | ICD-10-CM | POA: Diagnosis not present

## 2022-10-18 DIAGNOSIS — K3189 Other diseases of stomach and duodenum: Secondary | ICD-10-CM | POA: Diagnosis not present

## 2022-10-18 DIAGNOSIS — Z880 Allergy status to penicillin: Secondary | ICD-10-CM | POA: Diagnosis not present

## 2022-10-18 DIAGNOSIS — R109 Unspecified abdominal pain: Secondary | ICD-10-CM | POA: Diagnosis not present

## 2022-10-18 DIAGNOSIS — Z7982 Long term (current) use of aspirin: Secondary | ICD-10-CM | POA: Diagnosis not present

## 2022-10-18 DIAGNOSIS — K449 Diaphragmatic hernia without obstruction or gangrene: Secondary | ICD-10-CM | POA: Diagnosis not present

## 2022-10-18 DIAGNOSIS — R933 Abnormal findings on diagnostic imaging of other parts of digestive tract: Secondary | ICD-10-CM | POA: Diagnosis not present

## 2022-10-18 DIAGNOSIS — Z885 Allergy status to narcotic agent status: Secondary | ICD-10-CM | POA: Diagnosis not present

## 2022-10-18 DIAGNOSIS — E785 Hyperlipidemia, unspecified: Secondary | ICD-10-CM | POA: Diagnosis not present

## 2022-10-18 DIAGNOSIS — Z79899 Other long term (current) drug therapy: Secondary | ICD-10-CM | POA: Diagnosis not present

## 2022-10-18 DIAGNOSIS — R1310 Dysphagia, unspecified: Secondary | ICD-10-CM | POA: Diagnosis not present

## 2022-10-18 DIAGNOSIS — Z888 Allergy status to other drugs, medicaments and biological substances status: Secondary | ICD-10-CM | POA: Diagnosis not present

## 2022-10-18 DIAGNOSIS — Z9289 Personal history of other medical treatment: Secondary | ICD-10-CM | POA: Diagnosis not present

## 2022-10-18 DIAGNOSIS — K219 Gastro-esophageal reflux disease without esophagitis: Secondary | ICD-10-CM | POA: Diagnosis not present

## 2022-10-18 DIAGNOSIS — K222 Esophageal obstruction: Secondary | ICD-10-CM | POA: Diagnosis not present

## 2022-10-30 DIAGNOSIS — S0101XA Laceration without foreign body of scalp, initial encounter: Secondary | ICD-10-CM | POA: Diagnosis not present

## 2022-10-30 DIAGNOSIS — R296 Repeated falls: Secondary | ICD-10-CM | POA: Diagnosis not present

## 2022-10-30 DIAGNOSIS — M7989 Other specified soft tissue disorders: Secondary | ICD-10-CM | POA: Diagnosis not present

## 2022-10-30 DIAGNOSIS — M25522 Pain in left elbow: Secondary | ICD-10-CM | POA: Diagnosis not present

## 2022-10-30 DIAGNOSIS — W01198A Fall on same level from slipping, tripping and stumbling with subsequent striking against other object, initial encounter: Secondary | ICD-10-CM | POA: Diagnosis not present

## 2022-10-30 DIAGNOSIS — G20A1 Parkinson's disease without dyskinesia, without mention of fluctuations: Secondary | ICD-10-CM | POA: Diagnosis not present

## 2022-10-30 DIAGNOSIS — I10 Essential (primary) hypertension: Secondary | ICD-10-CM | POA: Diagnosis not present

## 2022-11-01 DIAGNOSIS — R252 Cramp and spasm: Secondary | ICD-10-CM | POA: Diagnosis not present

## 2022-11-01 DIAGNOSIS — M4802 Spinal stenosis, cervical region: Secondary | ICD-10-CM | POA: Diagnosis not present

## 2022-11-01 DIAGNOSIS — R296 Repeated falls: Secondary | ICD-10-CM | POA: Diagnosis not present

## 2022-11-01 DIAGNOSIS — Z79899 Other long term (current) drug therapy: Secondary | ICD-10-CM | POA: Diagnosis not present

## 2022-11-01 DIAGNOSIS — M9971 Connective tissue and disc stenosis of intervertebral foramina of cervical region: Secondary | ICD-10-CM | POA: Diagnosis not present

## 2022-11-01 DIAGNOSIS — R413 Other amnesia: Secondary | ICD-10-CM | POA: Diagnosis not present

## 2022-11-03 DIAGNOSIS — M25522 Pain in left elbow: Secondary | ICD-10-CM | POA: Diagnosis not present

## 2022-11-09 ENCOUNTER — Encounter: Payer: Self-pay | Admitting: Family Medicine

## 2022-11-09 ENCOUNTER — Ambulatory Visit (INDEPENDENT_AMBULATORY_CARE_PROVIDER_SITE_OTHER): Payer: Medicare Other | Admitting: Family Medicine

## 2022-11-09 VITALS — BP 136/84 | HR 82

## 2022-11-09 DIAGNOSIS — I10 Essential (primary) hypertension: Secondary | ICD-10-CM | POA: Diagnosis not present

## 2022-11-09 DIAGNOSIS — R258 Other abnormal involuntary movements: Secondary | ICD-10-CM | POA: Diagnosis not present

## 2022-11-09 DIAGNOSIS — E039 Hypothyroidism, unspecified: Secondary | ICD-10-CM

## 2022-11-09 DIAGNOSIS — Z23 Encounter for immunization: Secondary | ICD-10-CM | POA: Diagnosis not present

## 2022-11-09 DIAGNOSIS — N1831 Chronic kidney disease, stage 3a: Secondary | ICD-10-CM | POA: Diagnosis not present

## 2022-11-09 DIAGNOSIS — R296 Repeated falls: Secondary | ICD-10-CM

## 2022-11-09 DIAGNOSIS — S0101XA Laceration without foreign body of scalp, initial encounter: Secondary | ICD-10-CM

## 2022-11-09 NOTE — Assessment & Plan Note (Signed)
Due to recheck renal function.  Continuing to follow every 6 months.

## 2022-11-09 NOTE — Assessment & Plan Note (Addendum)
Home BPs running 137/69, 147/72.  Much better than here.  Will continue with current regimen.  It does not sound like she is having any low blood pressures which is reassuring.

## 2022-11-09 NOTE — Progress Notes (Signed)
Established Patient Office Visit  Subjective   Patient ID: Nancy Wilson, female    DOB: 1940-10-07  Age: 82 y.o. MRN: [id number]  Chief Complaint  Patient presents with   mood   Fall    HPI  She is here today to follow-up from a recent fall.  She was seen in the emergency department on February 10 after a fall in the bathroom at home she hit her head but did not lose consciousness she did experience a laceration.  Also hit her left elbow.  Head CT was negative for any acute injuries and elbow film was negative for any acute fractures.  She said she did have headaches for several days after the injuries but those seem to have resolved she is here today to have the Steri-Strips removed.  He also had a follow-up with the orthopedist on February 14 for the left elbow.  They repeated some x-ray films and she was told it was negative and they recommended applying a topical.  She did see neurology in regards to her hand cramping and they did an MRI back in January on September 29, 2022.  It did show some mild to moderate central spinal canal stenosis from C3-4 down to C4-5 and foraminal stenosis at several levels as well.  Did adjust her lisinopril at last office visit she reports that she is tolerating it well.    ROS    Objective:     BP 136/84   Pulse 82   SpO2 97%    Physical Exam Vitals and nursing note reviewed.  Constitutional:      Appearance: She is well-developed.  HENT:     Head: Normocephalic and atraumatic.  Cardiovascular:     Rate and Rhythm: Normal rate and regular rhythm.     Heart sounds: Normal heart sounds.  Pulmonary:     Effort: Pulmonary effort is normal.     Breath sounds: Normal breath sounds.  Skin:    General: Skin is warm and dry.     Comments: She has a large bruise on her left eye.  She also has a laceration on the left posterior scalp that appears to be healing well.  Neurological:     Mental Status: She is alert and oriented to person,  place, and time.  Psychiatric:        Behavior: Behavior normal.      No results found for any visits on 11/09/22.    The ASCVD Risk score (Arnett DK, et al., 2019) failed to calculate for the following reasons:   The 2019 ASCVD risk score is only valid for ages 57 to 29    Assessment & Plan:   Problem List Items Addressed This Visit       Cardiovascular and Mediastinum   HYPERTENSION, BENIGN - Primary    Home BPs running 137/69, 147/72.  Much better than here.  Will continue with current regimen.  It does not sound like she is having any low blood pressures which is reassuring.       Relevant Orders   COMPLETE METABOLIC PANEL WITH GFR   TSH     Endocrine   Hypothyroidism    Plan to recheck thyroid level today.      Relevant Orders   COMPLETE METABOLIC PANEL WITH GFR   TSH     Genitourinary   CKD (chronic kidney disease) stage 3, GFR 30-59 ml/min (HCC)    Due to recheck renal function.  Continuing to follow  every 6 months.      Relevant Orders   COMPLETE METABOLIC PANEL WITH GFR   TSH     Other   Frequent falls   Bradykinesia    Did discuss referral to formal physical therapy.  We are starting a PWR program likely in the next couple of weeks and I would really like to get her referred.  I think this would also potentially help reduce her frequent falls she does use a walker at home.      Other Visit Diagnoses     Need for pneumococcal 20-valent conjugate vaccination       Relevant Orders   Pneumococcal conjugate vaccine 20-valent (Prevnar 20) (Completed)   Laceration of scalp without foreign body, initial encounter          Scalp Laceration-appears to be healing well okay to start washing her hair just wash around the area gently and do not pick or pull at the scab in place just let it heal.  No follow-ups on file.    Beatrice Lecher, MD

## 2022-11-09 NOTE — Assessment & Plan Note (Signed)
Plan to recheck thyroid level today.

## 2022-11-09 NOTE — Assessment & Plan Note (Signed)
Did discuss referral to formal physical therapy.  We are starting a PWR program likely in the next couple of weeks and I would really like to get her referred.  I think this would also potentially help reduce her frequent falls she does use a walker at home.

## 2022-11-10 ENCOUNTER — Other Ambulatory Visit: Payer: Self-pay | Admitting: *Deleted

## 2022-11-10 DIAGNOSIS — R7989 Other specified abnormal findings of blood chemistry: Secondary | ICD-10-CM

## 2022-11-10 LAB — COMPLETE METABOLIC PANEL WITH GFR
AG Ratio: 1.7 (calc) (ref 1.0–2.5)
ALT: 8 U/L (ref 6–29)
AST: 21 U/L (ref 10–35)
Albumin: 4.3 g/dL (ref 3.6–5.1)
Alkaline phosphatase (APISO): 122 U/L (ref 37–153)
BUN/Creatinine Ratio: 15 (calc) (ref 6–22)
BUN: 18 mg/dL (ref 7–25)
CO2: 29 mmol/L (ref 20–32)
Calcium: 9.6 mg/dL (ref 8.6–10.4)
Chloride: 106 mmol/L (ref 98–110)
Creat: 1.23 mg/dL — ABNORMAL HIGH (ref 0.60–0.95)
Globulin: 2.5 g/dL (calc) (ref 1.9–3.7)
Glucose, Bld: 83 mg/dL (ref 65–99)
Potassium: 4.6 mmol/L (ref 3.5–5.3)
Sodium: 143 mmol/L (ref 135–146)
Total Bilirubin: 0.4 mg/dL (ref 0.2–1.2)
Total Protein: 6.8 g/dL (ref 6.1–8.1)
eGFR: 44 mL/min/{1.73_m2} — ABNORMAL LOW (ref >=60)

## 2022-11-10 LAB — TSH: TSH: 1.42 mIU/L (ref 0.40–4.50)

## 2022-11-10 NOTE — Progress Notes (Signed)
Patient: Kidney function is up just slightly from her baseline normally she is around 1.1 this time it was around 1.23.  I would like to recheck the kidney function in 1 month instead of waiting 6 months.  Just to make sure that it is stable or goes back down.  Liver and thyroid look good.

## 2022-11-15 ENCOUNTER — Telehealth: Payer: Self-pay | Admitting: Family Medicine

## 2022-11-15 NOTE — Telephone Encounter (Signed)
Called patient to schedule Medicare Annual Wellness Visit (AWV). Left message for patient to call back and schedule Medicare Annual Wellness Visit (AWV).  Last date of AWV: 04/15/21  Please schedule an appointment at any time with Nurse Health Advisor.  If any questions, please contact me at [phone].  Thank you ,  Lin Givens Patient Access Advocate II Direct Dial: [phone]

## 2022-12-04 ENCOUNTER — Other Ambulatory Visit: Payer: Self-pay | Admitting: Family Medicine

## 2022-12-04 DIAGNOSIS — I10 Essential (primary) hypertension: Secondary | ICD-10-CM

## 2022-12-04 DIAGNOSIS — I5032 Chronic diastolic (congestive) heart failure: Secondary | ICD-10-CM

## 2022-12-10 ENCOUNTER — Ambulatory Visit: Payer: Medicare Other | Admitting: Family Medicine

## 2022-12-14 DIAGNOSIS — G629 Polyneuropathy, unspecified: Secondary | ICD-10-CM | POA: Diagnosis not present

## 2022-12-14 DIAGNOSIS — R252 Cramp and spasm: Secondary | ICD-10-CM | POA: Diagnosis not present

## 2022-12-14 DIAGNOSIS — G5602 Carpal tunnel syndrome, left upper limb: Secondary | ICD-10-CM | POA: Diagnosis not present

## 2022-12-19 ENCOUNTER — Other Ambulatory Visit: Payer: Self-pay | Admitting: Family Medicine

## 2023-01-03 ENCOUNTER — Other Ambulatory Visit: Payer: Self-pay | Admitting: Family Medicine

## 2023-01-06 DIAGNOSIS — R202 Paresthesia of skin: Secondary | ICD-10-CM | POA: Diagnosis not present

## 2023-01-06 DIAGNOSIS — R413 Other amnesia: Secondary | ICD-10-CM | POA: Diagnosis not present

## 2023-01-06 DIAGNOSIS — R296 Repeated falls: Secondary | ICD-10-CM | POA: Diagnosis not present

## 2023-01-18 ENCOUNTER — Other Ambulatory Visit: Payer: Self-pay | Admitting: Family Medicine

## 2023-03-21 ENCOUNTER — Telehealth: Payer: Self-pay

## 2023-03-21 NOTE — Telephone Encounter (Signed)
Patient called the after hours nurse line on 03/17/23. Requesting an appt with Dr. Linford Arnold. No reason for the appt was provided to the on call nurse. Please contact the patient and schedule a appt for them as needed. Thanks in advance.

## 2023-03-23 DIAGNOSIS — K219 Gastro-esophageal reflux disease without esophagitis: Secondary | ICD-10-CM | POA: Diagnosis not present

## 2023-03-23 DIAGNOSIS — W01198A Fall on same level from slipping, tripping and stumbling with subsequent striking against other object, initial encounter: Secondary | ICD-10-CM | POA: Diagnosis not present

## 2023-03-23 DIAGNOSIS — S0990XA Unspecified injury of head, initial encounter: Secondary | ICD-10-CM | POA: Diagnosis not present

## 2023-03-23 DIAGNOSIS — Z880 Allergy status to penicillin: Secondary | ICD-10-CM | POA: Diagnosis not present

## 2023-03-23 DIAGNOSIS — Z043 Encounter for examination and observation following other accident: Secondary | ICD-10-CM | POA: Diagnosis not present

## 2023-03-23 DIAGNOSIS — S0101XA Laceration without foreign body of scalp, initial encounter: Secondary | ICD-10-CM | POA: Diagnosis not present

## 2023-03-23 DIAGNOSIS — G20A1 Parkinson's disease without dyskinesia, without mention of fluctuations: Secondary | ICD-10-CM | POA: Diagnosis not present

## 2023-03-23 DIAGNOSIS — E785 Hyperlipidemia, unspecified: Secondary | ICD-10-CM | POA: Diagnosis not present

## 2023-03-23 DIAGNOSIS — Z79899 Other long term (current) drug therapy: Secondary | ICD-10-CM | POA: Diagnosis not present

## 2023-03-23 DIAGNOSIS — Z7982 Long term (current) use of aspirin: Secondary | ICD-10-CM | POA: Diagnosis not present

## 2023-03-23 DIAGNOSIS — Z888 Allergy status to other drugs, medicaments and biological substances status: Secondary | ICD-10-CM | POA: Diagnosis not present

## 2023-03-23 DIAGNOSIS — I10 Essential (primary) hypertension: Secondary | ICD-10-CM | POA: Diagnosis not present

## 2023-04-04 ENCOUNTER — Encounter: Payer: Self-pay | Admitting: Family Medicine

## 2023-04-04 ENCOUNTER — Ambulatory Visit: Payer: Medicare Other | Admitting: Family Medicine

## 2023-04-04 VITALS — BP 154/90 | HR 73 | Resp 18 | Ht 59.0 in

## 2023-04-04 DIAGNOSIS — S0101XD Laceration without foreign body of scalp, subsequent encounter: Secondary | ICD-10-CM | POA: Diagnosis not present

## 2023-04-04 DIAGNOSIS — R296 Repeated falls: Secondary | ICD-10-CM

## 2023-04-04 DIAGNOSIS — Z4802 Encounter for removal of sutures: Secondary | ICD-10-CM

## 2023-04-04 NOTE — Assessment & Plan Note (Signed)
Staples removed from scalp. Pt tolerated it well

## 2023-04-04 NOTE — Patient Instructions (Signed)
Blood pressure medicines: Amlodipine 2.5mg   Lisinopril 40mg 

## 2023-04-04 NOTE — Progress Notes (Signed)
   Acute Office Visit  Subjective:     Patient ID: Nancy Wilson, female    DOB: 08-13-1941, 82 y.o.   MRN: [id number]  Chief Complaint  Patient presents with   Hospitalization Follow-up    HPI Patient is in today for acute visit for staple removal of the scalp. Pt had a fall and suffered a laceration to her scalp.   Review of Systems  Constitutional:  Negative for chills and fever.  Respiratory:  Negative for cough and shortness of breath.   Cardiovascular:  Negative for chest pain.  Neurological:  Negative for headaches.        Objective:    BP (!) 154/90 (BP Location: Right Arm, Patient Position: Sitting, Cuff Size: Normal)   Pulse 73   Resp 18   Ht 4\' 11"  (1.499 m)   SpO2 94%   BMI 32.52 kg/m    Physical Exam Vitals reviewed.  Constitutional:      Appearance: She is well-developed.  HENT:     Head: Normocephalic and atraumatic.  Eyes:     Conjunctiva/sclera: Conjunctivae normal.  Cardiovascular:     Rate and Rhythm: Normal rate.  Pulmonary:     Effort: Pulmonary effort is normal.  Skin:    General: Skin is dry.     Coloration: Skin is not pale.     Comments: Scalp has staple present with dried blood  Neurological:     Mental Status: She is alert and oriented to person, place, and time.  Psychiatric:        Behavior: Behavior normal.     No results found for any visits on 04/04/23.      Assessment & Plan:   Problem List Items Addressed This Visit       Other   Frequent falls    This is patient second fall in the last 6 months and would benefit from PT or a walker. Pt in our clinic wheelchair on evaluation      Encounter for staple removal    Staples removed from scalp. Pt tolerated it well        Other Visit Diagnoses     Laceration of scalp without foreign body, subsequent encounter    -  Primary      20 min spent with patient removing staples  No orders of the defined types were placed in this encounter.     Charlton Amor, DO

## 2023-04-04 NOTE — Assessment & Plan Note (Addendum)
This is patient second fall in the last 6 months and would benefit from PT or a walker. Pt in our clinic wheelchair on evaluation

## 2023-04-11 DIAGNOSIS — R519 Headache, unspecified: Secondary | ICD-10-CM | POA: Diagnosis not present

## 2023-04-11 DIAGNOSIS — R413 Other amnesia: Secondary | ICD-10-CM | POA: Diagnosis not present

## 2023-04-11 DIAGNOSIS — M4802 Spinal stenosis, cervical region: Secondary | ICD-10-CM | POA: Diagnosis not present

## 2023-04-11 DIAGNOSIS — G231 Progressive supranuclear ophthalmoplegia [Steele-Richardson-Olszewski]: Secondary | ICD-10-CM | POA: Diagnosis not present

## 2023-04-11 DIAGNOSIS — R296 Repeated falls: Secondary | ICD-10-CM | POA: Diagnosis not present

## 2023-04-13 ENCOUNTER — Other Ambulatory Visit: Payer: Self-pay | Admitting: Family Medicine

## 2023-04-23 DIAGNOSIS — G9389 Other specified disorders of brain: Secondary | ICD-10-CM | POA: Diagnosis not present

## 2023-04-23 DIAGNOSIS — I6782 Cerebral ischemia: Secondary | ICD-10-CM | POA: Diagnosis not present

## 2023-04-23 DIAGNOSIS — R519 Headache, unspecified: Secondary | ICD-10-CM | POA: Diagnosis not present

## 2023-04-23 DIAGNOSIS — R9082 White matter disease, unspecified: Secondary | ICD-10-CM | POA: Diagnosis not present

## 2023-04-23 DIAGNOSIS — R9089 Other abnormal findings on diagnostic imaging of central nervous system: Secondary | ICD-10-CM | POA: Diagnosis not present

## 2023-05-21 ENCOUNTER — Other Ambulatory Visit: Payer: Self-pay | Admitting: Family Medicine

## 2023-05-21 DIAGNOSIS — N3281 Overactive bladder: Secondary | ICD-10-CM

## 2023-06-06 ENCOUNTER — Other Ambulatory Visit: Payer: Self-pay | Admitting: Family Medicine

## 2023-06-06 DIAGNOSIS — E785 Hyperlipidemia, unspecified: Secondary | ICD-10-CM

## 2023-06-08 ENCOUNTER — Other Ambulatory Visit: Payer: Self-pay | Admitting: Family Medicine

## 2023-06-08 DIAGNOSIS — I10 Essential (primary) hypertension: Secondary | ICD-10-CM

## 2023-06-09 ENCOUNTER — Telehealth: Payer: Self-pay

## 2023-06-09 ENCOUNTER — Ambulatory Visit
Admission: EM | Admit: 2023-06-09 | Discharge: 2023-06-09 | Disposition: A | Discharge status: Home / Self Care | Payer: Medicare Other

## 2023-06-09 ENCOUNTER — Encounter: Payer: Self-pay | Admitting: Emergency Medicine

## 2023-06-09 DIAGNOSIS — S0990XA Unspecified injury of head, initial encounter: Secondary | ICD-10-CM | POA: Diagnosis not present

## 2023-06-09 DIAGNOSIS — Y92009 Unspecified place in unspecified non-institutional (private) residence as the place of occurrence of the external cause: Secondary | ICD-10-CM

## 2023-06-09 DIAGNOSIS — Z880 Allergy status to penicillin: Secondary | ICD-10-CM | POA: Diagnosis not present

## 2023-06-09 DIAGNOSIS — G231 Progressive supranuclear ophthalmoplegia [Steele-Richardson-Olszewski]: Secondary | ICD-10-CM | POA: Diagnosis not present

## 2023-06-09 DIAGNOSIS — R531 Weakness: Secondary | ICD-10-CM

## 2023-06-09 DIAGNOSIS — Z7982 Long term (current) use of aspirin: Secondary | ICD-10-CM | POA: Diagnosis not present

## 2023-06-09 DIAGNOSIS — G20A1 Parkinson's disease without dyskinesia, without mention of fluctuations: Secondary | ICD-10-CM | POA: Diagnosis not present

## 2023-06-09 DIAGNOSIS — G8911 Acute pain due to trauma: Secondary | ICD-10-CM | POA: Diagnosis not present

## 2023-06-09 DIAGNOSIS — W19XXXA Unspecified fall, initial encounter: Secondary | ICD-10-CM

## 2023-06-09 DIAGNOSIS — I5032 Chronic diastolic (congestive) heart failure: Secondary | ICD-10-CM

## 2023-06-09 DIAGNOSIS — I10 Essential (primary) hypertension: Secondary | ICD-10-CM | POA: Diagnosis not present

## 2023-06-09 DIAGNOSIS — M1990 Unspecified osteoarthritis, unspecified site: Secondary | ICD-10-CM | POA: Diagnosis not present

## 2023-06-09 DIAGNOSIS — S40012A Contusion of left shoulder, initial encounter: Secondary | ICD-10-CM | POA: Diagnosis not present

## 2023-06-09 DIAGNOSIS — R2689 Other abnormalities of gait and mobility: Secondary | ICD-10-CM | POA: Diagnosis not present

## 2023-06-09 DIAGNOSIS — E785 Hyperlipidemia, unspecified: Secondary | ICD-10-CM | POA: Diagnosis not present

## 2023-06-09 DIAGNOSIS — Z043 Encounter for examination and observation following other accident: Secondary | ICD-10-CM | POA: Diagnosis not present

## 2023-06-09 DIAGNOSIS — R9431 Abnormal electrocardiogram [ECG] [EKG]: Secondary | ICD-10-CM | POA: Diagnosis not present

## 2023-06-09 DIAGNOSIS — M25512 Pain in left shoulder: Secondary | ICD-10-CM

## 2023-06-09 DIAGNOSIS — Z888 Allergy status to other drugs, medicaments and biological substances status: Secondary | ICD-10-CM | POA: Diagnosis not present

## 2023-06-09 DIAGNOSIS — Z79899 Other long term (current) drug therapy: Secondary | ICD-10-CM | POA: Diagnosis not present

## 2023-06-09 DIAGNOSIS — N3000 Acute cystitis without hematuria: Secondary | ICD-10-CM | POA: Diagnosis not present

## 2023-06-09 DIAGNOSIS — K219 Gastro-esophageal reflux disease without esophagitis: Secondary | ICD-10-CM | POA: Diagnosis not present

## 2023-06-09 MED ORDER — LISINOPRIL 40 MG PO TABS
40.0000 mg | ORAL_TABLET | Freq: Every day | ORAL | 0 refills | Status: DC
Start: 2023-06-09 — End: 2023-09-01

## 2023-06-09 NOTE — Telephone Encounter (Signed)
Medication has been sent to pt's local pharmacy

## 2023-06-09 NOTE — ED Triage Notes (Signed)
Patient states that fell on Tuesday on left shoulder and arm.  Patient does have Parkinsons.  Denies any LOC.  Patient has taken meds for pain.

## 2023-06-09 NOTE — Telephone Encounter (Signed)
Pstients spouse Mr. Fredrik Cove came by office to see if patient could get medication refill on lisinopril (ZESTRIL) 40 MG tablet , Mr. Fredrik Cove states that patient is out of medication,and they use the pharmacy [facility] [phone] [place], [place] - [address]  [address] [place], [place] [address], please advise, thanks.

## 2023-06-09 NOTE — ED Provider Notes (Signed)
[facility]    CSN: [id number] Arrival date & time: 06/09/23  1422      History   Chief Complaint Chief Complaint  Patient presents with   Fall    HPI Nancy Wilson is a 82 y.o. female.   Nancy Wilson is a 82 y.o. female presenting with her husband who contributes to the history for chief complaint of fall 2 days ago. She got up from her recliner two days ago without her walker and fell onto the left shoulder. She is unsure if she hit her head.  She does not take blood thinning medications.  She is ambulatory with steady gait with walker without assistance at baseline.  Over the last 2 days, she has been complaining of left shoulder pain.  She has not taken her blood pressure medication in 2 days as well (blood pressure currently 208/94).  She has a history of Parkinson's and has been taking medications as prescribed. She lives at home with her husband who provides care for her. She has had approximately 6 or 7 falls in the last 6 months.  History of osteoporosis and high fall/fracture risk. Over the last 2 days, she has come progressively weaker.  No urinary symptoms reported.  Having normal bowel movements.  Drinking plenty of water.   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   Heart failure with preserved ejection fraction (HCC) 09/24/2020   Trigger finger of right thumb 07/18/2020   Bradykinesia 05/22/2020   Gait instability 06/05/2019   Sacroiliac joint pain 12/28/2018   Lumbar facet joint pain 12/28/2018   Closed compression fracture of L1 lumbar vertebra, initial encounter (HCC) 12/28/2018   Age-related osteoporosis with current pathological  fracture 12/28/2018   Lumbar foraminal stenosis 12/28/2018   Depression with anxiety 11/29/2018   RLS (restless legs syndrome) 11/29/2018   Frequent falls 09/21/2018   OAB (overactive bladder) 05/30/2017   Benign essential tremor 04/18/2017   Posterior vitreous detachment of left eye 05/20/2016   Trigger middle finger of right hand 12/01/2015   Bilateral plantar fasciitis 08/09/2014   Osteoarthritis of both ankles 08/09/2014   S/P laparoscopic appendectomy 12/13/2011   Night sweats 12/13/2011   Herpes simplex type II infection 09/23/2011   BENIGN POSITIONAL VERTIGO 04/24/2010   Hyperlipidemia 03/28/2009   Muscle cramping 01/01/2009   Hypothyroidism 04/03/2008   Vitamin D deficiency 04/03/2008   HYPERTENSION, BENIGN 04/03/2008   GERD 04/03/2008   HIATAL HERNIA 04/03/2008   DIVERTICULOSIS OF COLON 04/03/2008   CKD (chronic kidney disease) stage 3, GFR 30-59 ml/min (HCC) 04/03/2008   OSTEOARTHRITIS 04/03/2008   Osteoporosis 04/03/2008   EDEMA 04/03/2008   Migraine headache without aura 04/03/2008    Past Surgical History:  Procedure Laterality Date   carpel tunnel release, both hands  1980's   DILATION AND CURETTAGE OF UTERUS     FRACTURE SURGERY     fell and broke right arm   GASTROSCOPY  11-06   trigger finger release, left finger     TUBAL LIGATION     bilateral    OB History   No obstetric history on file.  Home Medications    Prior to Admission medications   Medication Sig Start Date End Date Taking? Authorizing Provider  acyclovir (ZOVIRAX) 400 MG tablet TAKE 1 TABLET BY MOUTH AT  BEDTIME 01/19/23  Yes Agapito Games, MD  alendronate (FOSAMAX) 70 MG tablet TAKE 1 TABLET BY MOUTH WEEKLY  WITH 8 OZ OF PLAIN WATER 30  MINUTES BEFORE FIRST FOOD, DRINK OR MEDS. STAY UPRIGHT FOR 30  MINS 04/13/23  Yes Agapito Games, MD  amLODipine (NORVASC) 2.5 MG tablet TAKE 1 TABLET BY MOUTH DAILY 06/09/23  Yes Agapito Games, MD  atorvastatin (LIPITOR) 20  MG tablet TAKE 1 TABLET BY MOUTH DAILY  NEEDS LAB WORK 06/08/23  Yes Agapito Games, MD  carbidopa-levodopa (SINEMET IR) 25-100 MG tablet Take 1.5 tab in AM, 1.5 tab NOON, and  1 TABLET BY MOUTH  DAILY AT BEDTIME PO 10/08/21  Yes Agapito Games, MD  Cholecalciferol (VITAMIN D3) 2000 units capsule Take 2,000 Units by mouth daily.   Yes [provider]  lisinopril (ZESTRIL) 40 MG tablet TAKE 1 TABLET BY MOUTH EVERY DAY 09/23/22  Yes Agapito Games, MD  omeprazole (PRILOSEC) 40 MG capsule Take 40 mg by mouth daily. 06/21/22  Yes Pleasant, Kerry Kass, PA  oxybutynin (DITROPAN) 5 MG tablet TAKE 1 TABLET BY MOUTH TWICE  DAILY 05/25/23  Yes Agapito Games, MD  propranolol (INDERAL) 40 MG tablet TAKE 1 TABLET BY MOUTH TWICE  DAILY 09/28/22  Yes Agapito Games, MD  Vilazodone HCl (VIIBRYD) 20 MG TABS Take 1 tablet (20 mg total) by mouth daily. Pleases start after complete 7 days of 10mg  07/22/22  Yes Agapito Games, MD    Family History Family History  Problem Relation Age of Onset   Diabetes Mother    Hyperlipidemia Mother    Heart disease Mother 34   Hypertension Mother    Diabetes Father    Hyperlipidemia Father    Tremor Father        hand- started in his 52's   Hypertension Father    Tremor Cousin     Social History Social History   Tobacco Use   Smoking status: Never   Smokeless tobacco: Never  Vaping Use   Vaping status: Never Used  Substance Use Topics   Alcohol use: No    Alcohol/week: 0.0 standard drinks of alcohol   Drug use: No     Allergies   Hydrocodone-acetaminophen, Codeine, Cymbalta [duloxetine hcl], Doxycycline, Effexor [venlafaxine], Guaifenesin, Meperidine, Meperidine hcl, Metronidazole, Morphine, Penicillins, Prednisone, Propoxyphene, Prozac [fluoxetine hcl], and Ropinirole   Review of Systems Review of Systems Per HPI  Physical Exam Triage Vital Signs ED Triage Vitals  Encounter Vitals Group     BP 06/09/23  1436 (!) 208/94     Systolic BP Percentile --      Diastolic BP Percentile --      Pulse Rate 06/09/23 1436 67     Resp 06/09/23 1436 18     Temp 06/09/23 1436 97.7 F (36.5 C)     Temp Source 06/09/23 1436 Oral     SpO2 06/09/23 1436 96 %     Weight 06/09/23 1437 161 lb (73 kg)     Height --      Head Circumference --      Peak Flow --      Pain Score 06/09/23 1437 9     Pain Loc --      Pain Education --  Exclude from Growth Chart --    No data found.  Updated Vital Signs BP (!) 208/94 (BP Location: Right Arm)   Pulse 67   Temp 97.7 F (36.5 C) (Oral)   Resp 18   Wt 161 lb (73 kg)   SpO2 96%   BMI 32.52 kg/m   Visual Acuity Right Eye Distance:   Left Eye Distance:   Bilateral Distance:    Right Eye Near:   Left Eye Near:    Bilateral Near:     Physical Exam Vitals and nursing note reviewed.  Constitutional:      Appearance: She is not ill-appearing or toxic-appearing.     Comments: Pleasant female seated in wheelchair in position of comfort, appears stated age.  HENT:     Head: Normocephalic and atraumatic.     Right Ear: Hearing and external ear normal.     Left Ear: Hearing and external ear normal.     Nose: Nose normal.     Mouth/Throat:     Lips: Pink.  Eyes:     General: Lids are normal. Vision grossly intact. Gaze aligned appropriately.     Extraocular Movements: Extraocular movements intact.     Conjunctiva/sclera: Conjunctivae normal.  Pulmonary:     Effort: Pulmonary effort is normal.  Musculoskeletal:     Cervical back: Neck supple.  Skin:    General: Skin is warm and dry.     Capillary Refill: Capillary refill takes less than 2 seconds.     Findings: No rash.  Neurological:     General: No focal deficit present.     Mental Status: She is alert and oriented to person, place, and time.     Cranial Nerves: No cranial nerve deficit, dysarthria or facial asymmetry.     Motor: Weakness (Generalized) present.     Gait: Gait abnormal  (Antalgic gait, two-person assist).  Psychiatric:        Mood and Affect: Mood normal.        Speech: Speech normal.        Behavior: Behavior normal.        Thought Content: Thought content normal.        Judgment: Judgment normal.      UC Treatments / Results  Labs (all labs ordered are listed, but only abnormal results are displayed) Labs Reviewed - No data to display  EKG   Radiology No results found.  Procedures Procedures (including critical care time)  Medications Ordered in UC Medications - No data to display  Initial Impression / Assessment and Plan / UC Course  I have reviewed the triage vital signs and the nursing notes.  Pertinent labs & imaging results that were available during my care of the patient were reviewed by me and considered in my medical decision making (see chart for details).   1.  Fall, generalized weakness Patient is observably very weak when attempting to change positions from wheelchair to standing to ambulate to the restroom and is currently a two-person assist.  This is very different from baseline.  She appears to lack her normal mental focus and her husband agrees that she has been slightly altered mentally over the last 2 days as well.  Given new onset generalized weakness with altered mental status from baseline, I recommend patient go to the nearest emergency department for further workup and evaluation to rule out potential AKI, dehydration, as well as progressing Parkinson's.  Discussed recommendations with patient and her husband who both expressed  understanding and agreement with plan. She is stable to be transported to the nearest emergency department via personal vehicle with her husband.  Final Clinical Impressions(s) / UC Diagnoses   Final diagnoses:  Fall, initial encounter  Generalized weakness     Discharge Instructions      Nancy Wilson is a 82 y.o. female presenting with her husband who contributes to the history  for chief complaint of fall 2 days ago. She got up from her recliner two days ago without her walker and fell onto the left shoulder. She is unsure if she hit her head.  She does not take blood thinning medications.  She is ambulatory with steady gait with walker without assistance at baseline.  Over the last 2 days, she has been complaining of left shoulder pain.  She has not taken her blood pressure medication in 2 days as well (blood pressure currently 208/94).  She has a history of Parkinson's and has been taking medications as prescribed. She lives at home with her husband who provides care for her. She has had approximately 6 or 7 falls in the last 6 months.  History of osteoporosis and high fall/fracture risk. Over the last 2 days, she has come progressively weaker.  No urinary symptoms reported.  Having normal bowel movements.  Drinking plenty of water.  She is seated in a wheelchair on arrival to urgent care and needed to use the restroom.  She is observably very weak when attempting to change positions from wheelchair to standing to ambulate to the restroom and is currently a two-person assist.  This is very different from baseline.  She appears to lack her normal mental focus and her husband agrees that she has been slightly altered mentally over the last 2 days as well.  Given new onset generalized weakness with altered mental status from baseline, I recommend patient go to the nearest emergency department for further workup and evaluation to rule out potential AKI, dehydration, as well as progressing Parkinson's.  Discussed recommendations with patient and her husband who both expressed understanding and agreement with plan. She is stable to be transported to the nearest emergency department via personal vehicle with her husband.   ED Prescriptions   None    PDMP not reviewed this encounter.   Carlisle Beers, [place] 06/09/23 1545

## 2023-06-09 NOTE — ED Notes (Signed)
Patient is being discharged from the Urgent Care and sent to the Emergency Department via private vehicle . Per Kelli Churn patient is in need of higher level of care due to further evaluation. Patient is aware and verbalizes understanding of plan of care.  Vitals:   06/09/23 1436  BP: (!) 208/94  Pulse: 67  Resp: 18  Temp: 97.7 F (36.5 C)  SpO2: 96%

## 2023-06-22 DIAGNOSIS — N3000 Acute cystitis without hematuria: Secondary | ICD-10-CM | POA: Diagnosis not present

## 2023-06-23 DIAGNOSIS — I161 Hypertensive emergency: Secondary | ICD-10-CM | POA: Diagnosis not present

## 2023-06-23 DIAGNOSIS — I6781 Acute cerebrovascular insufficiency: Secondary | ICD-10-CM | POA: Diagnosis not present

## 2023-06-23 DIAGNOSIS — I639 Cerebral infarction, unspecified: Secondary | ICD-10-CM | POA: Diagnosis not present

## 2023-06-23 DIAGNOSIS — G20A1 Parkinson's disease without dyskinesia, without mention of fluctuations: Secondary | ICD-10-CM | POA: Diagnosis not present

## 2023-06-23 DIAGNOSIS — B964 Proteus (mirabilis) (morganii) as the cause of diseases classified elsewhere: Secondary | ICD-10-CM | POA: Diagnosis not present

## 2023-06-23 DIAGNOSIS — R0902 Hypoxemia: Secondary | ICD-10-CM | POA: Diagnosis not present

## 2023-06-23 DIAGNOSIS — Z1152 Encounter for screening for COVID-19: Secondary | ICD-10-CM | POA: Diagnosis not present

## 2023-06-23 DIAGNOSIS — G250 Essential tremor: Secondary | ICD-10-CM | POA: Diagnosis not present

## 2023-06-23 DIAGNOSIS — R402421 Glasgow coma scale score 9-12, in the field [EMT or ambulance]: Secondary | ICD-10-CM | POA: Diagnosis not present

## 2023-06-23 DIAGNOSIS — I517 Cardiomegaly: Secondary | ICD-10-CM | POA: Diagnosis not present

## 2023-06-23 DIAGNOSIS — R404 Transient alteration of awareness: Secondary | ICD-10-CM | POA: Diagnosis not present

## 2023-06-23 DIAGNOSIS — R2990 Unspecified symptoms and signs involving the nervous system: Secondary | ICD-10-CM | POA: Diagnosis not present

## 2023-06-23 DIAGNOSIS — R4701 Aphasia: Secondary | ICD-10-CM | POA: Diagnosis not present

## 2023-06-23 DIAGNOSIS — R638 Other symptoms and signs concerning food and fluid intake: Secondary | ICD-10-CM | POA: Diagnosis not present

## 2023-06-23 DIAGNOSIS — E871 Hypo-osmolality and hyponatremia: Secondary | ICD-10-CM | POA: Diagnosis not present

## 2023-06-23 DIAGNOSIS — G4489 Other headache syndrome: Secondary | ICD-10-CM | POA: Diagnosis not present

## 2023-06-23 DIAGNOSIS — I340 Nonrheumatic mitral (valve) insufficiency: Secondary | ICD-10-CM | POA: Diagnosis not present

## 2023-06-23 DIAGNOSIS — M1990 Unspecified osteoarthritis, unspecified site: Secondary | ICD-10-CM | POA: Diagnosis not present

## 2023-06-23 DIAGNOSIS — K5900 Constipation, unspecified: Secondary | ICD-10-CM | POA: Diagnosis not present

## 2023-06-23 DIAGNOSIS — E785 Hyperlipidemia, unspecified: Secondary | ICD-10-CM | POA: Diagnosis not present

## 2023-06-23 DIAGNOSIS — N3281 Overactive bladder: Secondary | ICD-10-CM | POA: Diagnosis not present

## 2023-06-23 DIAGNOSIS — I674 Hypertensive encephalopathy: Secondary | ICD-10-CM | POA: Diagnosis not present

## 2023-06-23 DIAGNOSIS — R9082 White matter disease, unspecified: Secondary | ICD-10-CM | POA: Diagnosis not present

## 2023-06-23 DIAGNOSIS — R918 Other nonspecific abnormal finding of lung field: Secondary | ICD-10-CM | POA: Diagnosis not present

## 2023-06-23 DIAGNOSIS — E876 Hypokalemia: Secondary | ICD-10-CM | POA: Diagnosis not present

## 2023-06-23 DIAGNOSIS — R1311 Dysphagia, oral phase: Secondary | ICD-10-CM | POA: Diagnosis not present

## 2023-06-23 DIAGNOSIS — K5730 Diverticulosis of large intestine without perforation or abscess without bleeding: Secondary | ICD-10-CM | POA: Diagnosis not present

## 2023-06-23 DIAGNOSIS — K449 Diaphragmatic hernia without obstruction or gangrene: Secondary | ICD-10-CM | POA: Diagnosis not present

## 2023-06-23 DIAGNOSIS — G231 Progressive supranuclear ophthalmoplegia [Steele-Richardson-Olszewski]: Secondary | ICD-10-CM | POA: Diagnosis not present

## 2023-06-23 DIAGNOSIS — K219 Gastro-esophageal reflux disease without esophagitis: Secondary | ICD-10-CM | POA: Diagnosis not present

## 2023-06-23 DIAGNOSIS — Z881 Allergy status to other antibiotic agents status: Secondary | ICD-10-CM | POA: Diagnosis not present

## 2023-06-23 DIAGNOSIS — R531 Weakness: Secondary | ICD-10-CM | POA: Diagnosis not present

## 2023-06-23 DIAGNOSIS — E222 Syndrome of inappropriate secretion of antidiuretic hormone: Secondary | ICD-10-CM | POA: Diagnosis not present

## 2023-06-23 DIAGNOSIS — Z743 Need for continuous supervision: Secondary | ICD-10-CM | POA: Diagnosis not present

## 2023-06-23 DIAGNOSIS — N390 Urinary tract infection, site not specified: Secondary | ICD-10-CM | POA: Diagnosis not present

## 2023-06-23 DIAGNOSIS — Z7401 Bed confinement status: Secondary | ICD-10-CM | POA: Diagnosis not present

## 2023-06-23 DIAGNOSIS — G43909 Migraine, unspecified, not intractable, without status migrainosus: Secondary | ICD-10-CM | POA: Diagnosis not present

## 2023-06-23 DIAGNOSIS — I700 Atherosclerosis of aorta: Secondary | ICD-10-CM | POA: Diagnosis not present

## 2023-06-23 DIAGNOSIS — G319 Degenerative disease of nervous system, unspecified: Secondary | ICD-10-CM | POA: Diagnosis not present

## 2023-06-23 DIAGNOSIS — R6889 Other general symptoms and signs: Secondary | ICD-10-CM | POA: Diagnosis not present

## 2023-06-23 DIAGNOSIS — I10 Essential (primary) hypertension: Secondary | ICD-10-CM | POA: Diagnosis not present

## 2023-06-30 DIAGNOSIS — Z7401 Bed confinement status: Secondary | ICD-10-CM | POA: Diagnosis not present

## 2023-06-30 DIAGNOSIS — N3281 Overactive bladder: Secondary | ICD-10-CM | POA: Diagnosis not present

## 2023-06-30 DIAGNOSIS — I674 Hypertensive encephalopathy: Secondary | ICD-10-CM | POA: Diagnosis not present

## 2023-06-30 DIAGNOSIS — I161 Hypertensive emergency: Secondary | ICD-10-CM | POA: Diagnosis not present

## 2023-06-30 DIAGNOSIS — Z87440 Personal history of urinary (tract) infections: Secondary | ICD-10-CM | POA: Diagnosis not present

## 2023-06-30 DIAGNOSIS — E785 Hyperlipidemia, unspecified: Secondary | ICD-10-CM | POA: Diagnosis not present

## 2023-06-30 DIAGNOSIS — E871 Hypo-osmolality and hyponatremia: Secondary | ICD-10-CM | POA: Diagnosis not present

## 2023-06-30 DIAGNOSIS — Z8619 Personal history of other infectious and parasitic diseases: Secondary | ICD-10-CM | POA: Diagnosis not present

## 2023-06-30 DIAGNOSIS — Z7982 Long term (current) use of aspirin: Secondary | ICD-10-CM | POA: Diagnosis not present

## 2023-06-30 DIAGNOSIS — G231 Progressive supranuclear ophthalmoplegia [Steele-Richardson-Olszewski]: Secondary | ICD-10-CM | POA: Diagnosis not present

## 2023-06-30 DIAGNOSIS — K219 Gastro-esophageal reflux disease without esophagitis: Secondary | ICD-10-CM | POA: Diagnosis not present

## 2023-06-30 DIAGNOSIS — I10 Essential (primary) hypertension: Secondary | ICD-10-CM | POA: Diagnosis not present

## 2023-06-30 DIAGNOSIS — R2990 Unspecified symptoms and signs involving the nervous system: Secondary | ICD-10-CM | POA: Diagnosis not present

## 2023-06-30 DIAGNOSIS — G250 Essential tremor: Secondary | ICD-10-CM | POA: Diagnosis not present

## 2023-06-30 DIAGNOSIS — R0902 Hypoxemia: Secondary | ICD-10-CM | POA: Diagnosis not present

## 2023-06-30 DIAGNOSIS — Z23 Encounter for immunization: Secondary | ICD-10-CM | POA: Diagnosis not present

## 2023-06-30 DIAGNOSIS — G20A1 Parkinson's disease without dyskinesia, without mention of fluctuations: Secondary | ICD-10-CM | POA: Diagnosis not present

## 2023-06-30 DIAGNOSIS — N390 Urinary tract infection, site not specified: Secondary | ICD-10-CM | POA: Diagnosis not present

## 2023-06-30 DIAGNOSIS — Z7189 Other specified counseling: Secondary | ICD-10-CM | POA: Diagnosis not present

## 2023-07-05 DIAGNOSIS — E871 Hypo-osmolality and hyponatremia: Secondary | ICD-10-CM | POA: Diagnosis not present

## 2023-07-05 DIAGNOSIS — G20A1 Parkinson's disease without dyskinesia, without mention of fluctuations: Secondary | ICD-10-CM | POA: Diagnosis not present

## 2023-07-05 DIAGNOSIS — E785 Hyperlipidemia, unspecified: Secondary | ICD-10-CM | POA: Diagnosis not present

## 2023-07-05 DIAGNOSIS — N3281 Overactive bladder: Secondary | ICD-10-CM | POA: Diagnosis not present

## 2023-07-05 DIAGNOSIS — Z23 Encounter for immunization: Secondary | ICD-10-CM | POA: Diagnosis not present

## 2023-07-05 DIAGNOSIS — G231 Progressive supranuclear ophthalmoplegia [Steele-Richardson-Olszewski]: Secondary | ICD-10-CM | POA: Diagnosis not present

## 2023-07-05 DIAGNOSIS — I10 Essential (primary) hypertension: Secondary | ICD-10-CM | POA: Diagnosis not present

## 2023-07-05 DIAGNOSIS — Z87440 Personal history of urinary (tract) infections: Secondary | ICD-10-CM | POA: Diagnosis not present

## 2023-07-13 DIAGNOSIS — N3281 Overactive bladder: Secondary | ICD-10-CM | POA: Diagnosis not present

## 2023-07-13 DIAGNOSIS — G20A1 Parkinson's disease without dyskinesia, without mention of fluctuations: Secondary | ICD-10-CM | POA: Diagnosis not present

## 2023-07-13 DIAGNOSIS — I10 Essential (primary) hypertension: Secondary | ICD-10-CM | POA: Diagnosis not present

## 2023-07-13 DIAGNOSIS — G231 Progressive supranuclear ophthalmoplegia [Steele-Richardson-Olszewski]: Secondary | ICD-10-CM | POA: Diagnosis not present

## 2023-07-13 DIAGNOSIS — E871 Hypo-osmolality and hyponatremia: Secondary | ICD-10-CM | POA: Diagnosis not present

## 2023-07-13 DIAGNOSIS — Z7982 Long term (current) use of aspirin: Secondary | ICD-10-CM | POA: Diagnosis not present

## 2023-07-13 DIAGNOSIS — Z7189 Other specified counseling: Secondary | ICD-10-CM | POA: Diagnosis not present

## 2023-07-13 DIAGNOSIS — Z8619 Personal history of other infectious and parasitic diseases: Secondary | ICD-10-CM | POA: Diagnosis not present

## 2023-07-13 DIAGNOSIS — N390 Urinary tract infection, site not specified: Secondary | ICD-10-CM | POA: Diagnosis not present

## 2023-07-13 DIAGNOSIS — E785 Hyperlipidemia, unspecified: Secondary | ICD-10-CM | POA: Diagnosis not present

## 2023-07-18 DIAGNOSIS — G231 Progressive supranuclear ophthalmoplegia [Steele-Richardson-Olszewski]: Secondary | ICD-10-CM | POA: Diagnosis not present

## 2023-07-20 ENCOUNTER — Encounter: Payer: Self-pay | Admitting: Family Medicine

## 2023-07-20 ENCOUNTER — Ambulatory Visit (INDEPENDENT_AMBULATORY_CARE_PROVIDER_SITE_OTHER): Payer: Medicare Other | Admitting: Family Medicine

## 2023-07-20 VITALS — BP 134/70 | HR 65 | Ht 59.0 in | Wt 137.0 lb

## 2023-07-20 DIAGNOSIS — I10 Essential (primary) hypertension: Secondary | ICD-10-CM | POA: Diagnosis not present

## 2023-07-20 DIAGNOSIS — F418 Other specified anxiety disorders: Secondary | ICD-10-CM

## 2023-07-20 DIAGNOSIS — E871 Hypo-osmolality and hyponatremia: Secondary | ICD-10-CM | POA: Diagnosis not present

## 2023-07-20 DIAGNOSIS — E039 Hypothyroidism, unspecified: Secondary | ICD-10-CM

## 2023-07-20 DIAGNOSIS — G231 Progressive supranuclear ophthalmoplegia [Steele-Richardson-Olszewski]: Secondary | ICD-10-CM | POA: Diagnosis not present

## 2023-07-20 NOTE — Patient Instructions (Signed)
Cut the sertraline in half and take half a tab daily for 8 days and then 1/2 tab every other day for 6 days and then stop.   Ok to stop the Aspirin as well.

## 2023-07-20 NOTE — Assessment & Plan Note (Signed)
We did discuss trying to taper off of the SSRI because of the hyponatremia episodes.  She is actually tried multiple medications in the past and really not found them all that effective so I really wonder how much the sertraline is actually doing for her we will continue with the Wellbutrin for now with the plan to potentially taper that as well but I really only want to eliminate one of the medications at a time to make sure that it does not cause a major shift in her mood.

## 2023-07-20 NOTE — Progress Notes (Signed)
Established Patient Office Visit  Subjective   Patient ID: AGNESS DEILY, female    DOB: [birth year]  Age: 82 y.o. MRN: [id number]  Chief Complaint  Patient presents with   Hospitalization Follow-up    HPI  Ms. Benna Dunks is here today for hospital follow-up.  She was admitted to [facility] health after family members had noticed that she was becoming more weak and was so weak that she actually could not get into her wheelchair.  She was admitted on October 3 and discharged home 7 days later on October 10.  She was admitted for altered mental status secondary to a Proteus mirabilis UTI.  She received IV ceftriaxone 2 g daily brain MRI was negative for acute ischemia.  She did have some issues with hyponatremia while there.  She was discharged to [facility] nursing facility and is now back home.  So recommended adjustment to her nutrition they recommended mechanically soft foods with bite-size diet and making sure sitting upright while eating and taking small single bites and sips and eating slowly.  And remaining upright for 20 to 30 minutes after eating to reduce reflux.  He was discharged on Omnicef 300 mg and sodium chloride 1 g tablets 3 times daily with meals.    ROS    Objective:     BP 134/70   Pulse 65   Ht 4\' 11"  (1.499 m)   Wt 137 lb (62.1 kg)   SpO2 96%   BMI 27.67 kg/m    Physical Exam Vitals and nursing note reviewed.  Constitutional:      Appearance: Normal appearance.  HENT:     Head: Normocephalic and atraumatic.  Eyes:     Conjunctiva/sclera: Conjunctivae normal.  Cardiovascular:     Rate and Rhythm: Normal rate and regular rhythm.  Pulmonary:     Effort: Pulmonary effort is normal.     Breath sounds: Normal breath sounds.  Skin:    General: Skin is warm and dry.  Neurological:     Mental Status: She is alert.  Psychiatric:        Mood and Affect: Mood normal.      No results found for any visits on 07/20/23.    The ASCVD Risk score (Arnett DK,  et al., 2019) failed to calculate for the following reasons:   The 2019 ASCVD risk score is only valid for ages 79 to 15    Assessment & Plan:   Problem List Items Addressed This Visit       Cardiovascular and Mediastinum   HYPERTENSION, BENIGN    Pete blood pressure at goal today.  They do have a home blood pressure cuff so we discussed monitoring that a few times a week to see if we might be able to adjust her medication regimen they did increase her amlodipine so hopefully if it improves we might be able to go back down.  She is taking the propranolol 3 times a day so we are can try decreasing that down to twice a day.      Relevant Medications   amLODipine (NORVASC) 5 MG tablet     Endocrine   Hypothyroidism   Relevant Orders   Ambulatory referral to Home Health     Other   Depression with anxiety    We did discuss trying to taper off of the SSRI because of the hyponatremia episodes.  She is actually tried multiple medications in the past and really not found them all that effective so  I really wonder how much the sertraline is actually doing for her we will continue with the Wellbutrin for now with the plan to potentially taper that as well but I really only want to eliminate one of the medications at a time to make sure that it does not cause a major shift in her mood.      Relevant Medications   buPROPion (WELLBUTRIN XL) 300 MG 24 hr tablet   Other Visit Diagnoses     Hyponatremia    -  Primary   Relevant Orders   CMP14+EGFR   Ambulatory referral to Home Health   PSP (progressive supranuclear palsy) (HCC)       Relevant Orders   Ambulatory referral to Home Health      Hyponatremia-currently on 1 g of salt tabs daily with each meal she has felt more nauseated with her medication since starting this.  Will recheck sodium levels today also really want to taper off her sertraline since this could be contributing to the hyponatremia as well and my hope is that we can  decrease the frequency of the salt tabs but we will have to monitor carefully.  Will call with results once available.  Progressive supranuclear palsy-follows with neurology closely.  Continue carbidopa levodopa for tremor.  She does have some Parkinson's-like features as well.  She has felt that she has gotten more weak since her hospitalization in the stay at the rehab so we will get home health to come in and do some physical therapy as well.  We spent an extensive amount of time cleaning up her medication list and going through them one by one.  Okay to go ahead and stop her aspirin.  Will taper off the sertraline as well.  Once we get the labs back we can take a look at the sodium chloride tablets.  In the future we will look at potentially weaning the Wellbutrin.  Plan to follow back up in 6 weeks.  Return in about 6 weeks (around 08/31/2023) for recheck BP and meds .   I spent 45 minutes on the day of the encounter to include pre-visit record review, face-to-face time with the patient and post visit ordering of test.   Nani Gasser, MD

## 2023-07-20 NOTE — Assessment & Plan Note (Signed)
Pete blood pressure at goal today.  They do have a home blood pressure cuff so we discussed monitoring that a few times a week to see if we might be able to adjust her medication regimen they did increase her amlodipine so hopefully if it improves we might be able to go back down.  She is taking the propranolol 3 times a day so we are can try decreasing that down to twice a day.

## 2023-07-21 LAB — CMP14+EGFR
ALT: 9 [IU]/L (ref 0–32)
AST: 22 [IU]/L (ref 0–40)
Albumin: 4.1 g/dL (ref 3.7–4.7)
Alkaline Phosphatase: 121 [IU]/L (ref 44–121)
BUN/Creatinine Ratio: 15 (ref 12–28)
BUN: 15 mg/dL (ref 8–27)
Bilirubin Total: 0.4 mg/dL (ref 0.0–1.2)
CO2: 22 mmol/L (ref 20–29)
Calcium: 9.5 mg/dL (ref 8.7–10.3)
Chloride: 100 mmol/L (ref 96–106)
Creatinine, Ser: 0.99 mg/dL (ref 0.57–1.00)
Globulin, Total: 2.2 g/dL (ref 1.5–4.5)
Glucose: 93 mg/dL (ref 70–99)
Potassium: 5 mmol/L (ref 3.5–5.2)
Sodium: 138 mmol/L (ref 134–144)
Total Protein: 6.3 g/dL (ref 6.0–8.5)
eGFR: 57 mL/min/{1.73_m2} — ABNORMAL LOW (ref >59)

## 2023-07-21 NOTE — Progress Notes (Signed)
Kidney function looks better this time.

## 2023-08-09 ENCOUNTER — Encounter: Payer: Self-pay | Admitting: Family Medicine

## 2023-08-09 DIAGNOSIS — G231 Progressive supranuclear ophthalmoplegia [Steele-Richardson-Olszewski]: Secondary | ICD-10-CM

## 2023-08-09 DIAGNOSIS — N3946 Mixed incontinence: Secondary | ICD-10-CM

## 2023-08-10 DIAGNOSIS — G231 Progressive supranuclear ophthalmoplegia [Steele-Richardson-Olszewski]: Secondary | ICD-10-CM | POA: Insufficient documentation

## 2023-08-10 DIAGNOSIS — R32 Unspecified urinary incontinence: Secondary | ICD-10-CM | POA: Insufficient documentation

## 2023-08-10 MED ORDER — BUPROPION HCL ER (XL) 300 MG PO TB24: 300.0000 mg | ORAL_TABLET | Freq: Every morning | ORAL | 1 refills | Status: DC

## 2023-08-10 MED ORDER — AMBULATORY NON FORMULARY MEDICATION: 99 refills | Status: AC

## 2023-08-10 MED ORDER — DONEPEZIL HCL 10 MG PO TABS: 10.0000 mg | ORAL_TABLET | Freq: Every day | ORAL | 1 refills | Status: DC

## 2023-08-10 MED ORDER — ROPINIROLE HCL 0.25 MG PO TABS: 0.2500 mg | ORAL_TABLET | Freq: Every day | ORAL | 1 refills | Status: DC

## 2023-08-10 NOTE — Telephone Encounter (Signed)
Prescription printed for device.

## 2023-08-14 ENCOUNTER — Other Ambulatory Visit: Payer: Self-pay | Admitting: Family Medicine

## 2023-08-14 DIAGNOSIS — R258 Other abnormal involuntary movements: Secondary | ICD-10-CM

## 2023-08-23 ENCOUNTER — Encounter: Payer: Self-pay | Admitting: Family Medicine

## 2023-08-23 ENCOUNTER — Other Ambulatory Visit: Payer: Self-pay | Admitting: Family Medicine

## 2023-08-24 MED ORDER — SODIUM CHLORIDE 1 G PO TABS: 1.0000 g | ORAL_TABLET | Freq: Three times a day (TID) | ORAL | 1 refills | Status: DC

## 2023-08-26 ENCOUNTER — Other Ambulatory Visit: Payer: Self-pay | Admitting: Family Medicine

## 2023-09-01 ENCOUNTER — Encounter: Payer: Self-pay | Admitting: Family Medicine

## 2023-09-01 ENCOUNTER — Ambulatory Visit (INDEPENDENT_AMBULATORY_CARE_PROVIDER_SITE_OTHER): Payer: Medicare Other | Admitting: Family Medicine

## 2023-09-01 VITALS — BP 154/72 | HR 102 | Ht 59.0 in | Wt 137.0 lb

## 2023-09-01 DIAGNOSIS — E039 Hypothyroidism, unspecified: Secondary | ICD-10-CM

## 2023-09-01 DIAGNOSIS — F418 Other specified anxiety disorders: Secondary | ICD-10-CM | POA: Diagnosis not present

## 2023-09-01 DIAGNOSIS — E871 Hypo-osmolality and hyponatremia: Secondary | ICD-10-CM | POA: Diagnosis not present

## 2023-09-01 DIAGNOSIS — I10 Essential (primary) hypertension: Secondary | ICD-10-CM | POA: Diagnosis not present

## 2023-09-01 DIAGNOSIS — G231 Progressive supranuclear ophthalmoplegia [Steele-Richardson-Olszewski]: Secondary | ICD-10-CM

## 2023-09-01 MED ORDER — BUPROPION HCL ER (XL) 150 MG PO TB24: ORAL_TABLET | ORAL | 0 refills | Status: DC

## 2023-09-01 MED ORDER — AMLODIPINE-OLMESARTAN 5-40 MG PO TABS: 1.0000 | ORAL_TABLET | Freq: Every day | ORAL | 1 refills | Status: DC

## 2023-09-01 NOTE — Patient Instructions (Signed)
Sent new rx for Wellbutrin 150mg  Take 1 tablet (150 mg total) by mouth every morning for 14 days, THEN 1 tablet (150 mg total) every other day for 14 days. Then stop .Marland Kitchen

## 2023-09-01 NOTE — Assessment & Plan Note (Signed)
Will taper off the Wellbutrin.  Sent new rx for Wellbutrin 150mg  Take 1 tablet (150 mg total) by mouth every morning for 14 days, THEN 1 tablet (150 mg total) every other day for 14 days. Then stop .Marland Kitchen

## 2023-09-01 NOTE — Assessment & Plan Note (Signed)
Continue lisinopril and amlodipine and start Azor.  New prescription sent to pharmacy.

## 2023-09-01 NOTE — Progress Notes (Signed)
Established Patient Office Visit  Subjective  Patient ID: Nancy Wilson, female    DOB: 06/13/1941  Age: 82 y.o. MRN: [id number]  Chief Complaint  Patient presents with   Hypertension    HPI  6 week f/u for multiple med changes after recent ospitalizations.   Hypertension- Pt denies chest pain, SOB, dizziness, or heart palpitations.  Taking meds as directed w/o problems.  Denies medication side effects.    F/U PSP - Following with Neurology.    F/U depression and anxiety - we tapered off sertraline but still on wellbutrin.  She has been doing really well emotionally. No major change in mood.    Hyponatremia - the sodium makes her stomach hurt.    Hypothyroidism - Taking medication regularly in the AM away from food and vitamins, etc. No recent change to skin, hair, or energy levels.     ROS    Objective:     BP (!) 154/72   Pulse (!) 102   Ht 4\' 11"  (1.499 m)   Wt 137 lb (62.1 kg)   SpO2 96%   BMI 27.67 kg/m    Physical Exam Vitals and nursing note reviewed.  Constitutional:      Appearance: Normal appearance.  HENT:     Head: Normocephalic and atraumatic.  Eyes:     Conjunctiva/sclera: Conjunctivae normal.  Cardiovascular:     Rate and Rhythm: Normal rate and regular rhythm.  Pulmonary:     Effort: Pulmonary effort is normal.     Breath sounds: Normal breath sounds.  Skin:    General: Skin is warm and dry.  Neurological:     Mental Status: She is alert.  Psychiatric:        Mood and Affect: Mood normal.      Results for orders placed or performed in visit on 09/01/23  Basic Metabolic Panel (BMET)  Result Value Ref Range   Glucose 93 70 - 99 mg/dL   BUN 21 8 - 27 mg/dL   Creatinine, Ser 1.02 (H) 0.57 - 1.00 mg/dL   eGFR 45 (L) >72 ZD/GUY/4.03   BUN/Creatinine Ratio 17 12 - 28   Sodium 141 134 - 144 mmol/L   Potassium 4.7 3.5 - 5.2 mmol/L   Chloride 104 96 - 106 mmol/L   CO2 20 20 - 29 mmol/L   Calcium 9.6 8.7 - 10.3 mg/dL  TSH  Result  Value Ref Range   TSH 1.970 0.450 - 4.500 uIU/mL      The ASCVD Risk score (Arnett DK, et al., 2019) failed to calculate for the following reasons:   The 2019 ASCVD risk score is only valid for ages 56 to 56    Assessment & Plan:   Problem List Items Addressed This Visit       Cardiovascular and Mediastinum   HYPERTENSION, BENIGN - Primary   Continue lisinopril and amlodipine and start Azor.  New prescription sent to pharmacy.      Relevant Medications   amLODipine-olmesartan (AZOR) 5-40 MG tablet   Other Relevant Orders   Basic Metabolic Panel (BMET) (Completed)   TSH (Completed)     Endocrine   Hypothyroidism   Plan to recheck TSH.      Relevant Orders   Basic Metabolic Panel (BMET) (Completed)   TSH (Completed)     Nervous and Auditory   PSP (progressive supranuclear palsy) (HCC)   Relevant Orders   Basic Metabolic Panel (BMET) (Completed)   TSH (Completed)     Other  Hyponatremia   Plan to recheck sodium levels today if they are normal then [place] to try to decrease her BM down to twice a day it does, upset her stomach when she takes it and she is on it 3 times a day if that still looks good after 3 to 4 weeks on 2 tabs daily then we will try to decrease down to at least once.  Was hoping that just taking her off the SSRI would make a big difference.      Relevant Orders   Basic Metabolic Panel (BMET) (Completed)   TSH (Completed)   Depression with anxiety   Will taper off the Wellbutrin.  Sent new rx for Wellbutrin 150mg  Take 1 tablet (150 mg total) by mouth every morning for 14 days, THEN 1 tablet (150 mg total) every other day for 14 days. Then stop .Marland Kitchen       Relevant Medications   buPROPion (WELLBUTRIN XL) 150 MG 24 hr tablet   Other Relevant Orders   Basic Metabolic Panel (BMET) (Completed)   TSH (Completed)    Return in about 2 months (around 11/02/2023) for Mood, Hypertension.    Nani Gasser, MD

## 2023-09-02 ENCOUNTER — Encounter: Payer: Self-pay | Admitting: Family Medicine

## 2023-09-02 DIAGNOSIS — E871 Hypo-osmolality and hyponatremia: Secondary | ICD-10-CM | POA: Insufficient documentation

## 2023-09-02 LAB — BASIC METABOLIC PANEL
BUN/Creatinine Ratio: 17 (ref 12–28)
BUN: 21 mg/dL (ref 8–27)
CO2: 20 mmol/L (ref 20–29)
Calcium: 9.6 mg/dL (ref 8.7–10.3)
Chloride: 104 mmol/L (ref 96–106)
Creatinine, Ser: 1.21 mg/dL — ABNORMAL HIGH (ref 0.57–1.00)
Glucose: 93 mg/dL (ref 70–99)
Potassium: 4.7 mmol/L (ref 3.5–5.2)
Sodium: 141 mmol/L (ref 134–144)
eGFR: 45 mL/min/{1.73_m2} — ABNORMAL LOW (ref >59)

## 2023-09-02 LAB — TSH: TSH: 1.97 u[IU]/mL (ref 0.450–4.500)

## 2023-09-02 NOTE — Assessment & Plan Note (Signed)
Plan to recheck TSH.   

## 2023-09-02 NOTE — Assessment & Plan Note (Signed)
Plan to recheck sodium levels today if they are normal then [facility] to try to decrease her BM down to twice a day it does, upset her stomach when she takes it and she is on it 3 times a day if that still looks good after 3 to 4 weeks on 2 tabs daily then we will try to decrease down to at least once.  Was hoping that just taking her off the SSRI would make a big difference.

## 2023-09-02 NOTE — Progress Notes (Signed)
Kidney function is stable.  Stay hydrated. Ok to drop down to sodium tab twice a day (instead of 3 x a day). Lets recheck BMP in 3-4 weeks and if still good we can try to drop down to 1 sodium tab a day

## 2023-09-20 ENCOUNTER — Other Ambulatory Visit: Payer: Self-pay | Admitting: Family Medicine

## 2023-09-20 DIAGNOSIS — I10 Essential (primary) hypertension: Secondary | ICD-10-CM

## 2023-09-20 DIAGNOSIS — I5032 Chronic diastolic (congestive) heart failure: Secondary | ICD-10-CM

## 2023-09-21 ENCOUNTER — Other Ambulatory Visit: Payer: Self-pay | Admitting: Family Medicine

## 2023-09-21 DIAGNOSIS — F418 Other specified anxiety disorders: Secondary | ICD-10-CM

## 2023-09-27 ENCOUNTER — Ambulatory Visit: Payer: Medicare Other | Admitting: Family Medicine

## 2023-10-27 ENCOUNTER — Other Ambulatory Visit: Payer: Self-pay | Admitting: Family Medicine

## 2023-11-03 ENCOUNTER — Ambulatory Visit (INDEPENDENT_AMBULATORY_CARE_PROVIDER_SITE_OTHER): Payer: Medicare Other | Admitting: Family Medicine

## 2023-11-03 ENCOUNTER — Telehealth: Payer: Self-pay | Admitting: Family Medicine

## 2023-11-03 ENCOUNTER — Encounter: Payer: Self-pay | Admitting: Family Medicine

## 2023-11-03 VITALS — BP 134/66 | HR 57 | Ht 59.0 in | Wt 143.0 lb

## 2023-11-03 DIAGNOSIS — N3281 Overactive bladder: Secondary | ICD-10-CM

## 2023-11-03 DIAGNOSIS — S0083XA Contusion of other part of head, initial encounter: Secondary | ICD-10-CM | POA: Diagnosis not present

## 2023-11-03 DIAGNOSIS — R296 Repeated falls: Secondary | ICD-10-CM | POA: Diagnosis not present

## 2023-11-03 DIAGNOSIS — F330 Major depressive disorder, recurrent, mild: Secondary | ICD-10-CM | POA: Diagnosis not present

## 2023-11-03 DIAGNOSIS — I10 Essential (primary) hypertension: Secondary | ICD-10-CM

## 2023-11-03 DIAGNOSIS — R252 Cramp and spasm: Secondary | ICD-10-CM | POA: Diagnosis not present

## 2023-11-03 DIAGNOSIS — W19XXXA Unspecified fall, initial encounter: Secondary | ICD-10-CM

## 2023-11-03 DIAGNOSIS — R200 Anesthesia of skin: Secondary | ICD-10-CM | POA: Diagnosis not present

## 2023-11-03 DIAGNOSIS — G231 Progressive supranuclear ophthalmoplegia [Steele-Richardson-Olszewski]: Secondary | ICD-10-CM

## 2023-11-03 MED ORDER — SERTRALINE HCL 25 MG PO TABS: 25.0000 mg | ORAL_TABLET | Freq: Every day | ORAL | 1 refills | Status: DC

## 2023-11-03 MED ORDER — BACLOFEN 10 MG PO TABS: 10.0000 mg | ORAL_TABLET | Freq: Three times a day (TID) | ORAL | 1 refills | Status: DC

## 2023-11-03 MED ORDER — SOLIFENACIN SUCCINATE 10 MG PO TABS: 10.0000 mg | ORAL_TABLET | Freq: Every day | ORAL | 1 refills | Status: DC

## 2023-11-03 NOTE — Addendum Note (Signed)
Addended by: Nani Gasser D on: 11/03/2023 05:54 PM   Modules accepted: Orders

## 2023-11-03 NOTE — Telephone Encounter (Signed)
Please call Dr. Larina Earthly office her neurologist.  She has an appointment coming up either next week or the week after.  I wanted to make him aware that she is complaining of some left foot numbness some not sure if she had mentioned this previously she has got good pulses and good warm feet so I do not think it is vascular.

## 2023-11-03 NOTE — Progress Notes (Addendum)
Established Patient Office Visit  Subjective  Patient ID: Nancy Wilson, female    DOB: 24-Aug-1941  Age: 83 y.o. MRN: [id number]  No chief complaint on file.   HPI  She is here for 49-month blood pressure follow-up it was quite elevated when I saw her back in December.  We discontinued lisinopril and amlodipine and started Azor.  He and her husband both wanted to let me know that she is actually been feeling more down and would like to consider restarting her antidepressant.  She previously took Zoloft.  She says she has not been more tearful but has more thoughts of not wanting to be here.  She also just wanted to make sure that her flu shots up-to-date.  She also reports urinating frequently sometimes as often as every 2 hours during the day and even more often at night.  She is on Ditropan XL but it does not seem to be helping all that much.    ROS    Objective:     BP 134/66   Pulse (!) 57   Ht 4\' 11"  (1.499 m)   Wt 143 lb (64.9 kg)   SpO2 99%   BMI 28.88 kg/m    Physical Exam Vitals and nursing note reviewed.  Constitutional:      Appearance: Normal appearance.  HENT:     Head: Normocephalic and atraumatic.  Eyes:     Conjunctiva/sclera: Conjunctivae normal.  Cardiovascular:     Rate and Rhythm: Normal rate and regular rhythm.  Pulmonary:     Effort: Pulmonary effort is normal.     Breath sounds: Normal breath sounds.  Musculoskeletal:     Comments: Foot with some trace edema over the foot with less around the ankle.  Dorsal pedal pulse and posterior tibial pulses 2+ bilaterally.  Skin is warm.  No rash etc.  Skin:    General: Skin is warm and dry.     Comments: Have a very large bruise underneath her chin.  Nontender along the jawline.    Neurological:     Mental Status: She is alert.  Psychiatric:        Mood and Affect: Mood normal.      No results found for any visits on 11/03/23.    The ASCVD Risk score (Arnett DK, et al., 2019) failed to  calculate for the following reasons:   The 2019 ASCVD risk score is only valid for ages 59 to 43    Assessment & Plan:   Problem List Items Addressed This Visit       Cardiovascular and Mediastinum   HYPERTENSION, BENIGN - Primary   Well controlled on combination pill.  I think it is helping her blood pressure stay more consistently controlled and we were also able to reduce her pill burden which is great.. Continue current regimen. Follow up in  50mo       Relevant Orders   BMP8+EGFR     Nervous and Auditory   PSP (progressive supranuclear palsy) (HCC)   Aptt next week with neurology..           Genitourinary   OAB (overactive bladder)   We discussed that we could try a different bladder medication instead of the Ditropan and see if that would work well or better. Will try Vesciare       Relevant Medications   solifenacin (VESICARE) 10 MG tablet     Other   Muscle cramping   She would like a refill  on her baclofen and.  I was able to see it on the medication list but not sure who previously prescribed it.  She does take it up to 3 times a day.      Relevant Medications   baclofen (LIORESAL) 10 MG tablet   MDD (major depressive disorder), recurrent episode, mild (HCC)   Start sertraline at 25 mg.  Follow-up in a couple months we can always adjust the dose upward and encouraged her to give me a call if she feels like we might need to go ahead and make that adjustment.  She did taper completely off the bupropion so I did remove that from her medication list.      Relevant Medications   sertraline (ZOLOFT) 25 MG tablet   Frequent falls   Unfortunately did fall again recently and has a bruise and some swelling underneath her chin.  She said she was trying to transfer from the wheelchair to the walker.  She is not quite sure what she hit her chin on but says it seems to be getting better.  No dental pain today.      Other Visit Diagnoses       Numbness of left foot          Fall, initial encounter         Contusion of chin, initial encounter           She also complains of left foot numbness.  Dorsal pedal and posterior tibial pulses are 2+.  Foot feels warm but with some trace edema.  I discussed that I would let her neurologist, Dr. Antonietta Barcelona,  know for her follow-up next week to see if they want to do any additional workup.  She feels like because of the numbness it feels more weak.  Return in about 3 months (around 01/31/2024) for restart Mood medication .   I spent 40 minutes on the day of the encounter to include pre-visit record review, face-to-face time with the patient and post visit ordering of test.  Nani Gasser, MD

## 2023-11-03 NOTE — Progress Notes (Signed)
Recent fall  Pt is asking for refill on Baclofen

## 2023-11-03 NOTE — Assessment & Plan Note (Addendum)
Aptt next week with neurology.Marland Kitchen

## 2023-11-03 NOTE — Assessment & Plan Note (Signed)
Unfortunately did fall again recently and has a bruise and some swelling underneath her chin.  She said she was trying to transfer from the wheelchair to the walker.  She is not quite sure what she hit her chin on but says it seems to be getting better.  No dental pain today.

## 2023-11-03 NOTE — Assessment & Plan Note (Addendum)
Well controlled on combination pill.  I think it is helping her blood pressure stay more consistently controlled and we were also able to reduce her pill burden which is great.. Continue current regimen. Follow up in  8mo

## 2023-11-03 NOTE — Assessment & Plan Note (Signed)
She would like a refill on her baclofen and.  I was able to see it on the medication list but not sure who previously prescribed it.  She does take it up to 3 times a day.

## 2023-11-03 NOTE — Assessment & Plan Note (Signed)
We discussed that we could try a different bladder medication instead of the Ditropan and see if that would work well or better. Will try Vesciare

## 2023-11-03 NOTE — Assessment & Plan Note (Signed)
Start sertraline at 25 mg.  Follow-up in a couple months we can always adjust the dose upward and encouraged her to give me a call if she feels like we might need to go ahead and make that adjustment.  She did taper completely off the bupropion so I did remove that from her medication list.

## 2023-11-04 ENCOUNTER — Encounter: Payer: Self-pay | Admitting: Family Medicine

## 2023-11-04 LAB — BMP8+EGFR
BUN/Creatinine Ratio: 25 (ref 12–28)
BUN: 28 mg/dL — ABNORMAL HIGH (ref 8–27)
CO2: 23 mmol/L (ref 20–29)
Calcium: 9.6 mg/dL (ref 8.7–10.3)
Chloride: 104 mmol/L (ref 96–106)
Creatinine, Ser: 1.11 mg/dL — ABNORMAL HIGH (ref 0.57–1.00)
Glucose: 98 mg/dL (ref 70–99)
Potassium: 4.5 mmol/L (ref 3.5–5.2)
Sodium: 141 mmol/L (ref 134–144)
eGFR: 50 mL/min/{1.73_m2} — ABNORMAL LOW (ref >59)

## 2023-11-04 NOTE — Telephone Encounter (Signed)
Spoke with nurse "terry" who will forward message to Dr. Antonietta Barcelona. States patient's appt is schld for 11/14/23.

## 2023-11-04 NOTE — Progress Notes (Signed)
Kidney function looks a little better this time.

## 2023-11-07 ENCOUNTER — Other Ambulatory Visit: Payer: Self-pay | Admitting: *Deleted

## 2023-11-07 DIAGNOSIS — I10 Essential (primary) hypertension: Secondary | ICD-10-CM

## 2023-11-14 DIAGNOSIS — G231 Progressive supranuclear ophthalmoplegia [Steele-Richardson-Olszewski]: Secondary | ICD-10-CM | POA: Diagnosis not present

## 2023-11-16 ENCOUNTER — Telehealth: Payer: Self-pay | Admitting: Family Medicine

## 2023-11-16 ENCOUNTER — Other Ambulatory Visit: Payer: Self-pay | Admitting: Family Medicine

## 2023-11-16 NOTE — Telephone Encounter (Signed)
 Neurology rec d/c baclofen.   Removed form med list. Please call pharmacy to d/c refills.

## 2023-11-16 NOTE — Progress Notes (Signed)
 Neurology rec d/c baclofen.   Removed form med list. Please call pharmacy to d/c refills.

## 2023-11-17 NOTE — Telephone Encounter (Signed)
 Spoke with pharmacy - removed Baclofen from med list.  Attempted call to inform patient of medication being d/c - left a voice mail message requesting a return call.

## 2023-11-30 ENCOUNTER — Other Ambulatory Visit: Payer: Self-pay | Admitting: Family Medicine

## 2023-11-30 DIAGNOSIS — R252 Cramp and spasm: Secondary | ICD-10-CM

## 2023-12-05 ENCOUNTER — Encounter: Payer: Self-pay | Admitting: Family Medicine

## 2023-12-05 DIAGNOSIS — R2242 Localized swelling, mass and lump, left lower limb: Secondary | ICD-10-CM

## 2023-12-07 MED ORDER — TRAMADOL HCL 50 MG PO TABS: 50.0000 mg | ORAL_TABLET | Freq: Every evening | ORAL | 0 refills | Status: AC | PRN

## 2023-12-12 DIAGNOSIS — R413 Other amnesia: Secondary | ICD-10-CM | POA: Diagnosis not present

## 2023-12-12 DIAGNOSIS — G231 Progressive supranuclear ophthalmoplegia [Steele-Richardson-Olszewski]: Secondary | ICD-10-CM | POA: Diagnosis not present

## 2023-12-13 NOTE — Addendum Note (Signed)
 Addended by: Nani Gasser D on: 12/13/2023 07:23 AM   Modules accepted: Orders

## 2023-12-22 ENCOUNTER — Encounter: Payer: Self-pay | Admitting: Family Medicine

## 2023-12-22 ENCOUNTER — Ambulatory Visit

## 2023-12-22 DIAGNOSIS — R2242 Localized swelling, mass and lump, left lower limb: Secondary | ICD-10-CM | POA: Diagnosis not present

## 2023-12-22 DIAGNOSIS — M7989 Other specified soft tissue disorders: Secondary | ICD-10-CM | POA: Diagnosis not present

## 2023-12-22 DIAGNOSIS — R200 Anesthesia of skin: Secondary | ICD-10-CM | POA: Diagnosis not present

## 2023-12-22 NOTE — Progress Notes (Signed)
 Hi Nechuma, just wanted to update you on your scan.  No sign of a blood clot in your leg which is very reassuring so it is just localized swelling more on that 1 leg.  I would recommend compression stockings to help push some of that fluid backend you put them on in the morning and then take them off in the evening a pressure of around 20-25 would work well.  You can usually purchase them offline like [employer] or get it from a local medical supply store.

## 2023-12-23 ENCOUNTER — Encounter: Payer: Self-pay | Admitting: Family Medicine

## 2023-12-23 LAB — BASIC METABOLIC PANEL WITH GFR
BUN/Creatinine Ratio: 25 (ref 12–28)
BUN: 31 mg/dL — ABNORMAL HIGH (ref 8–27)
CO2: 21 mmol/L (ref 20–29)
Calcium: 9.7 mg/dL (ref 8.7–10.3)
Chloride: 102 mmol/L (ref 96–106)
Creatinine, Ser: 1.22 mg/dL — ABNORMAL HIGH (ref 0.57–1.00)
Glucose: 97 mg/dL (ref 70–99)
Potassium: 5.1 mmol/L (ref 3.5–5.2)
Sodium: 136 mmol/L (ref 134–144)
eGFR: 44 mL/min/{1.73_m2} — ABNORMAL LOW (ref >59)

## 2023-12-23 NOTE — Progress Notes (Signed)
 Your lab work is within acceptable range and there are no concerning findings.   ?

## 2024-01-26 ENCOUNTER — Encounter: Payer: Self-pay | Admitting: Family Medicine

## 2024-01-26 ENCOUNTER — Ambulatory Visit: Admitting: Family Medicine

## 2024-01-26 ENCOUNTER — Ambulatory Visit

## 2024-01-26 ENCOUNTER — Other Ambulatory Visit: Payer: Self-pay | Admitting: Family Medicine

## 2024-01-26 VITALS — BP 138/68 | HR 63 | Ht 59.0 in | Wt 144.0 lb

## 2024-01-26 DIAGNOSIS — M79675 Pain in left toe(s): Secondary | ICD-10-CM | POA: Diagnosis not present

## 2024-01-26 DIAGNOSIS — R350 Frequency of micturition: Secondary | ICD-10-CM

## 2024-01-26 DIAGNOSIS — S99922A Unspecified injury of left foot, initial encounter: Secondary | ICD-10-CM | POA: Diagnosis not present

## 2024-01-26 DIAGNOSIS — N3281 Overactive bladder: Secondary | ICD-10-CM | POA: Diagnosis not present

## 2024-01-26 DIAGNOSIS — M19072 Primary osteoarthritis, left ankle and foot: Secondary | ICD-10-CM | POA: Diagnosis not present

## 2024-01-26 MED ORDER — DARIFENACIN HYDROBROMIDE ER 15 MG PO TB24: 15.0000 mg | ORAL_TABLET | Freq: Every day | ORAL | 1 refills | Status: DC

## 2024-01-26 NOTE — Telephone Encounter (Signed)
 Enablex not covered by insurance will send over prescription for Sanctura.

## 2024-01-26 NOTE — Telephone Encounter (Signed)
 Alternative Requested:THE PRESCRIBED MEDICATION IS NOT COVERED BY INSURANCE. PLEASE CONSIDER CHANGING TO ONE OF THE SUGGESTED COVERED ALTERNATIVES.   All Pharmacy Suggested Alternatives:  oxybutynin  (DITROPAN  XL) 15 MG 24 hr tablet solifenacin  (VESICARE ) 10 MG tablet tolterodine (DETROL) 2 MG tablet trospium (SANCTURA) 20 MG tablet   Rx auth team - [place] required for oxybutynin  rx

## 2024-01-26 NOTE — Progress Notes (Signed)
 Acute Office Visit  Subjective:     Patient ID: Nancy Wilson, female    DOB: 1941/07/10, 83 y.o.   MRN: [id number]  Chief Complaint  Patient presents with   Over Active Bladder    HPI Patient is in today for     Pt states that she has been urinating a lot more. She denies any increase in her fluid intake and she informed me that she is taking the Vesicare  10 mg 2x a day and this doesn't help with her sxs     Urinating at least 4-5 x a night and about every 1-2 hours at home.  No dysuria, etc.    She also says yesterday she fell and injured her left great toe.  It is bruised it feels painful to bear weight on it she has been elevating it no ice.  She is able to flex and extend the toe without significant difficulty.  She stubbed her toe yesterday.    ROS      Objective:    BP 138/68 (BP Location: Left Arm, Cuff Size: Normal)   Pulse 63   Ht 4\' 11"  (1.499 m)   Wt 144 lb (65.3 kg)   SpO2 96%   BMI 29.08 kg/m    Physical Exam Vitals reviewed.  Constitutional:      Appearance: Normal appearance.  HENT:     Head: Normocephalic.  Pulmonary:     Effort: Pulmonary effort is normal.  Musculoskeletal:     Comments: Left great toe is mildly bruised and swollen she has some trace edema on the top of the left foot as well nontender along the distal metatarsal heads for the 2nd through 5th digits but she is tender at the metatarsal head at the great toe as well as the distal joint of the great toe as well she has significant bruising she is able to flex and extend.  Neurological:     Mental Status: She is alert and oriented to person, place, and time.  Psychiatric:        Mood and Affect: Mood normal.        Behavior: Behavior normal.     No results found for any visits on 01/26/24.      Assessment & Plan:   Problem List Items Addressed This Visit       Genitourinary   OAB (overactive bladder) - Primary   She has significant urinary frequency but it could  also be related to a neurogenic bladder which may be why the medications do not seem to be helping she is open to trying a third agent but if that is not helpful over the next month we did discuss that she would need a full urology referral at that point to see if there are any options for her.  She had also tried Ditropan  in the past without success.  Will try Enablex       Relevant Medications   darifenacin (ENABLEX) 15 MG 24 hr tablet   Other Visit Diagnoses       Urinary frequency       Relevant Medications   darifenacin (ENABLEX) 15 MG 24 hr tablet     Injury of left great toe, initial encounter       Relevant Orders   DG Toe Great Left       Left great toe injury-recommend x-ray to rule out fracture we will call her with results hopefully it is just bruised.  She is able  to flex and extend so no significant tendon disruption which is reassuring continue with elevation and icing.  Meds ordered this encounter  Medications   darifenacin (ENABLEX) 15 MG 24 hr tablet    Sig: Take 1 tablet (15 mg total) by mouth daily.    Dispense:  30 tablet    Refill:  1    No follow-ups on file.  Duaine German, MD

## 2024-01-26 NOTE — Assessment & Plan Note (Addendum)
 She has significant urinary frequency but it could also be related to a neurogenic bladder which may be why the medications do not seem to be helping she is open to trying a third agent but if that is not helpful over the next month we did discuss that she would need a full urology referral at that point to see if there are any options for her.  She had also tried Ditropan  in the past without success.  Will try Enablex

## 2024-01-26 NOTE — Progress Notes (Signed)
 Pt states that she has been urinating a lot more. She denies any increase in her fluid intake and she informed me that she is taking the Vesicare  10 mg 2x a day and this doesn't help with her sxs.

## 2024-01-27 ENCOUNTER — Other Ambulatory Visit (HOSPITAL_COMMUNITY): Payer: Self-pay

## 2024-01-27 NOTE — Telephone Encounter (Signed)
 This request has been handled. No further action is required. Patient's husband has been update regarding the alternative medication.

## 2024-01-31 ENCOUNTER — Ambulatory Visit: Payer: Medicare Other | Admitting: Family Medicine

## 2024-02-01 ENCOUNTER — Ambulatory Visit: Payer: Self-pay | Admitting: Family Medicine

## 2024-02-01 NOTE — Progress Notes (Signed)
 Nancy Wilson, good news!  No sign of fracture of the toe just some swelling.  So it should hopefully get better over the next week or 2 try to elevate and ice as needed.

## 2024-02-02 ENCOUNTER — Encounter: Payer: Self-pay | Admitting: Family Medicine

## 2024-02-18 ENCOUNTER — Other Ambulatory Visit: Payer: Self-pay | Admitting: Family Medicine

## 2024-02-18 DIAGNOSIS — R350 Frequency of micturition: Secondary | ICD-10-CM

## 2024-02-18 DIAGNOSIS — N3281 Overactive bladder: Secondary | ICD-10-CM

## 2024-02-20 ENCOUNTER — Other Ambulatory Visit: Payer: Self-pay | Admitting: Family Medicine

## 2024-02-20 DIAGNOSIS — F330 Major depressive disorder, recurrent, mild: Secondary | ICD-10-CM

## 2024-02-21 ENCOUNTER — Other Ambulatory Visit: Payer: Self-pay | Admitting: Family Medicine

## 2024-02-21 DIAGNOSIS — N3281 Overactive bladder: Secondary | ICD-10-CM

## 2024-02-21 DIAGNOSIS — R350 Frequency of micturition: Secondary | ICD-10-CM

## 2024-02-22 MED ORDER — TROSPIUM CHLORIDE 20 MG PO TABS: 20.0000 mg | ORAL_TABLET | Freq: Every day | ORAL | 0 refills | Status: DC

## 2024-02-22 NOTE — Telephone Encounter (Signed)
 Changed to Sanctura 20 mg

## 2024-03-12 ENCOUNTER — Other Ambulatory Visit: Payer: Self-pay | Admitting: Family Medicine

## 2024-03-12 DIAGNOSIS — I10 Essential (primary) hypertension: Secondary | ICD-10-CM

## 2024-03-12 DIAGNOSIS — N3281 Overactive bladder: Secondary | ICD-10-CM

## 2024-03-12 DIAGNOSIS — F418 Other specified anxiety disorders: Secondary | ICD-10-CM

## 2024-03-12 NOTE — Telephone Encounter (Signed)
 Please advise on refill request

## 2024-03-26 ENCOUNTER — Encounter: Payer: Self-pay | Admitting: Family Medicine

## 2024-03-26 DIAGNOSIS — R350 Frequency of micturition: Secondary | ICD-10-CM

## 2024-03-26 NOTE — Telephone Encounter (Signed)
 Urology referral placed.  I want a get her in with our sports med doc for her arm and her leg to see what would be best to do.  It is a little bit more complicated than just ordering an MRI.  To really know what we are looking for and what type of imaging is best to determine that.

## 2024-03-27 ENCOUNTER — Other Ambulatory Visit: Payer: Self-pay | Admitting: Family Medicine

## 2024-03-27 MED ORDER — DONEPEZIL HCL 10 MG PO TABS: 10.0000 mg | ORAL_TABLET | Freq: Every day | ORAL | 1 refills | Status: DC

## 2024-04-04 ENCOUNTER — Encounter: Payer: Self-pay | Admitting: Family Medicine

## 2024-04-16 ENCOUNTER — Ambulatory Visit (INDEPENDENT_AMBULATORY_CARE_PROVIDER_SITE_OTHER): Admitting: Sports Medicine

## 2024-04-16 ENCOUNTER — Ambulatory Visit

## 2024-04-16 ENCOUNTER — Other Ambulatory Visit (INDEPENDENT_AMBULATORY_CARE_PROVIDER_SITE_OTHER)

## 2024-04-16 DIAGNOSIS — M25512 Pain in left shoulder: Secondary | ICD-10-CM

## 2024-04-16 DIAGNOSIS — I700 Atherosclerosis of aorta: Secondary | ICD-10-CM | POA: Diagnosis not present

## 2024-04-16 DIAGNOSIS — G8929 Other chronic pain: Secondary | ICD-10-CM | POA: Diagnosis not present

## 2024-04-16 DIAGNOSIS — M5030 Other cervical disc degeneration, unspecified cervical region: Secondary | ICD-10-CM | POA: Insufficient documentation

## 2024-04-16 DIAGNOSIS — M542 Cervicalgia: Secondary | ICD-10-CM | POA: Diagnosis not present

## 2024-04-16 DIAGNOSIS — M778 Other enthesopathies, not elsewhere classified: Secondary | ICD-10-CM | POA: Diagnosis not present

## 2024-04-16 DIAGNOSIS — M47816 Spondylosis without myelopathy or radiculopathy, lumbar region: Secondary | ICD-10-CM | POA: Diagnosis not present

## 2024-04-16 DIAGNOSIS — M47812 Spondylosis without myelopathy or radiculopathy, cervical region: Secondary | ICD-10-CM | POA: Diagnosis not present

## 2024-04-16 DIAGNOSIS — M549 Dorsalgia, unspecified: Secondary | ICD-10-CM | POA: Diagnosis not present

## 2024-04-16 DIAGNOSIS — M898X8 Other specified disorders of bone, other site: Secondary | ICD-10-CM | POA: Diagnosis not present

## 2024-04-16 DIAGNOSIS — M19012 Primary osteoarthritis, left shoulder: Secondary | ICD-10-CM | POA: Diagnosis not present

## 2024-04-16 DIAGNOSIS — M8580 Other specified disorders of bone density and structure, unspecified site: Secondary | ICD-10-CM | POA: Diagnosis not present

## 2024-04-16 MED ORDER — PREDNISONE 50 MG PO TABS: ORAL_TABLET | ORAL | 0 refills | Status: DC

## 2024-04-16 MED ORDER — TRIAMCINOLONE ACETONIDE 40 MG/ML IJ SUSP
40.0000 mg | Freq: Once | INTRAMUSCULAR | Status: AC
  Administered 2024-04-16: 40 mg via INTRA_ARTICULAR

## 2024-04-16 NOTE — Assessment & Plan Note (Signed)
 Known cervical DDD with a large disc protrusion diagnosed back in 2016, now having posterior and left-sided axial neck pain. She is on the gabapentin up taper, currently doing 100 mg twice a day, next week she will do 100 mg 3 times daily. Adding x-rays, prednisone , home health physical therapy, return in 6 weeks, MR for interventional planning if not better.

## 2024-04-16 NOTE — Addendum Note (Signed)
 Addended by: OLEY CHIQUITA CROME on: 04/16/2024 02:34 PM   Modules accepted: Orders

## 2024-04-16 NOTE — Progress Notes (Signed)
    Procedures performed today:    Procedure: Real-time Ultrasound Guided injection of the left glenohumeral joint Device: Samsung HS60  Verbal informed consent obtained.  Time-out conducted.  Noted no overlying erythema, induration, or other signs of local infection.  Skin prepped in a sterile fashion.  Local anesthesia: Topical Ethyl chloride.  With sterile technique and under real time ultrasound guidance: Arthritic joint noted, 1 cc Kenalog  40, 2 cc lidocaine , 2 cc bupivacaine injected easily Completed without difficulty  Advised to call if fevers/chills, erythema, induration, drainage, or persistent bleeding.  Images permanently stored and available for review in PACS.  Impression: Technically successful ultrasound guided injection.  Independent interpretation of notes and tests performed by another provider:   None.  Brief History, Exam, Impression, and Recommendations:    Chronic left shoulder pain This is a very pleasant 83 year old female, she has had years of pain left shoulder with significant loss of motion, she does have an underlying history of Parkinson's disease. On exam she has discomfort with external rotation, abduction, she does have a frozen shoulder type picture. Osteoarthritis is also in the differential. Will get x-rays, a glenohumeral joint injection. Home health physical therapy. She is homebound.  DDD (degenerative disc disease), cervical Known cervical DDD with a large disc protrusion diagnosed back in 2016, now having posterior and left-sided axial neck pain. She is on the gabapentin up taper, currently doing 100 mg twice a day, next week she will do 100 mg 3 times daily. Adding x-rays, prednisone , home health physical therapy, return in 6 weeks, MR for interventional planning if not better.  Lumbar spondylosis Also with axial back pain radiating down the back of the left leg to the left calf, negative Homans' sign. Good strength and sensation  distally. Suspect more of a radicular process, continue gabapentin up titration, prednisone  as above, x-rays. Return to see me in 6 weeks.    ____________________________________________ Nancy Wilson, M.D., ABFM., CAQSM., AME. Primary Care and Sports Medicine [facility]  Adjunct Professor of [facility] Medicine  [employer]  Restaurant manager, fast food

## 2024-04-16 NOTE — Assessment & Plan Note (Signed)
 Also with axial back pain radiating down the back of the left leg to the left calf, negative Homans' sign. Good strength and sensation distally. Suspect more of a radicular process, continue gabapentin up titration, prednisone  as above, x-rays. Return to see me in 6 weeks.

## 2024-04-16 NOTE — Assessment & Plan Note (Signed)
 This is a very pleasant 83 year old female, she has had years of pain left shoulder with significant loss of motion, she does have an underlying history of Parkinson's disease. On exam she has discomfort with external rotation, abduction, she does have a frozen shoulder type picture. Osteoarthritis is also in the differential. Will get x-rays, a glenohumeral joint injection. Home health physical therapy. She is homebound.

## 2024-04-18 ENCOUNTER — Other Ambulatory Visit: Payer: Self-pay | Admitting: Family Medicine

## 2024-04-18 DIAGNOSIS — N3281 Overactive bladder: Secondary | ICD-10-CM

## 2024-04-22 ENCOUNTER — Ambulatory Visit: Payer: Self-pay | Admitting: Sports Medicine

## 2024-04-22 DIAGNOSIS — Z7952 Long term (current) use of systemic steroids: Secondary | ICD-10-CM | POA: Diagnosis not present

## 2024-04-22 DIAGNOSIS — M5030 Other cervical disc degeneration, unspecified cervical region: Secondary | ICD-10-CM | POA: Diagnosis not present

## 2024-04-22 DIAGNOSIS — M47896 Other spondylosis, lumbar region: Secondary | ICD-10-CM | POA: Diagnosis not present

## 2024-04-22 DIAGNOSIS — M19012 Primary osteoarthritis, left shoulder: Secondary | ICD-10-CM | POA: Diagnosis not present

## 2024-04-22 DIAGNOSIS — Z556 Problems related to health literacy: Secondary | ICD-10-CM | POA: Diagnosis not present

## 2024-04-25 DIAGNOSIS — M5030 Other cervical disc degeneration, unspecified cervical region: Secondary | ICD-10-CM | POA: Diagnosis not present

## 2024-04-25 DIAGNOSIS — M19012 Primary osteoarthritis, left shoulder: Secondary | ICD-10-CM | POA: Diagnosis not present

## 2024-04-25 DIAGNOSIS — Z556 Problems related to health literacy: Secondary | ICD-10-CM | POA: Diagnosis not present

## 2024-04-25 DIAGNOSIS — M47896 Other spondylosis, lumbar region: Secondary | ICD-10-CM | POA: Diagnosis not present

## 2024-04-25 DIAGNOSIS — Z7952 Long term (current) use of systemic steroids: Secondary | ICD-10-CM | POA: Diagnosis not present

## 2024-05-02 DIAGNOSIS — M5030 Other cervical disc degeneration, unspecified cervical region: Secondary | ICD-10-CM | POA: Diagnosis not present

## 2024-05-02 DIAGNOSIS — Z556 Problems related to health literacy: Secondary | ICD-10-CM | POA: Diagnosis not present

## 2024-05-02 DIAGNOSIS — M47896 Other spondylosis, lumbar region: Secondary | ICD-10-CM | POA: Diagnosis not present

## 2024-05-02 DIAGNOSIS — M19012 Primary osteoarthritis, left shoulder: Secondary | ICD-10-CM | POA: Diagnosis not present

## 2024-05-02 DIAGNOSIS — Z7952 Long term (current) use of systemic steroids: Secondary | ICD-10-CM | POA: Diagnosis not present

## 2024-05-06 ENCOUNTER — Other Ambulatory Visit: Payer: Self-pay | Admitting: Family Medicine

## 2024-05-06 DIAGNOSIS — E785 Hyperlipidemia, unspecified: Secondary | ICD-10-CM

## 2024-05-15 ENCOUNTER — Other Ambulatory Visit: Payer: Self-pay | Admitting: Family Medicine

## 2024-05-16 ENCOUNTER — Telehealth: Payer: Self-pay | Admitting: *Deleted

## 2024-05-16 ENCOUNTER — Telehealth: Payer: Self-pay

## 2024-05-16 DIAGNOSIS — Z556 Problems related to health literacy: Secondary | ICD-10-CM | POA: Diagnosis not present

## 2024-05-16 NOTE — Telephone Encounter (Signed)
 Copied from CRM [id number]. Topic: General - Other >> May 16, 2024  4:09 PM Kevelyn M wrote: Reason for CRM: Bruising around the left eye and small cut. Her eye is black (lid and underneath). Leaning forward in wheelchair and fell out. Fall happened 05/12/2024.  Call back# [phone]

## 2024-05-16 NOTE — Telephone Encounter (Signed)
 Attempted call to patient - spoke with husband - Nature conservation officer recommended to give our office a call  No issues with vision in eye. No pain. Just left eye involved. Patient does not think that  a visit  is needed at his time but will call if feels one is needed in future.

## 2024-05-16 NOTE — Telephone Encounter (Signed)
 Nathanel OT with [facility] called to inform us  that [name] fell out of her wheelchair on Saturday while leaning forward attempting to pick something up.  Pt hit the L side of her face causing bruising around her L eye. There was no LOC. She does have a small abrasion over her L eye that has a bandaid on it. The area is not deep more like a scratch.   Pt does report that since she awakened she has had a headache. She took Tylenol  and this did help with her pain. She also stated that she has been experiencing headaches prior to the fall.   Pt denies any N/V, visual/speech changes.   I advised to have pt's family members to look out for any changes such as these and if these get worse to seek emergent care. She stated that her husband agreed and understood this.

## 2024-05-16 NOTE — Telephone Encounter (Signed)
 OK, thank you.

## 2024-05-17 ENCOUNTER — Encounter: Payer: Self-pay | Admitting: Family Medicine

## 2024-05-17 ENCOUNTER — Other Ambulatory Visit: Payer: Self-pay | Admitting: Family Medicine

## 2024-05-17 DIAGNOSIS — M5030 Other cervical disc degeneration, unspecified cervical region: Secondary | ICD-10-CM | POA: Diagnosis not present

## 2024-05-22 ENCOUNTER — Encounter: Payer: Self-pay | Admitting: Sports Medicine

## 2024-05-24 DIAGNOSIS — M5030 Other cervical disc degeneration, unspecified cervical region: Secondary | ICD-10-CM | POA: Diagnosis not present

## 2024-05-24 DIAGNOSIS — Z7952 Long term (current) use of systemic steroids: Secondary | ICD-10-CM | POA: Diagnosis not present

## 2024-05-24 DIAGNOSIS — Z556 Problems related to health literacy: Secondary | ICD-10-CM | POA: Diagnosis not present

## 2024-05-24 DIAGNOSIS — M19012 Primary osteoarthritis, left shoulder: Secondary | ICD-10-CM | POA: Diagnosis not present

## 2024-05-24 DIAGNOSIS — M47896 Other spondylosis, lumbar region: Secondary | ICD-10-CM | POA: Diagnosis not present

## 2024-05-28 ENCOUNTER — Ambulatory Visit: Admitting: Sports Medicine

## 2024-05-30 ENCOUNTER — Ambulatory Visit: Admitting: Sports Medicine

## 2024-05-30 DIAGNOSIS — Z556 Problems related to health literacy: Secondary | ICD-10-CM | POA: Diagnosis not present

## 2024-05-30 DIAGNOSIS — M19012 Primary osteoarthritis, left shoulder: Secondary | ICD-10-CM | POA: Diagnosis not present

## 2024-05-30 DIAGNOSIS — Z7952 Long term (current) use of systemic steroids: Secondary | ICD-10-CM | POA: Diagnosis not present

## 2024-05-30 DIAGNOSIS — M5030 Other cervical disc degeneration, unspecified cervical region: Secondary | ICD-10-CM | POA: Diagnosis not present

## 2024-05-30 DIAGNOSIS — M47896 Other spondylosis, lumbar region: Secondary | ICD-10-CM | POA: Diagnosis not present

## 2024-05-31 ENCOUNTER — Ambulatory Visit

## 2024-06-01 ENCOUNTER — Ambulatory Visit

## 2024-06-06 ENCOUNTER — Encounter: Payer: Self-pay | Admitting: Sports Medicine

## 2024-06-06 ENCOUNTER — Ambulatory Visit: Admitting: Sports Medicine

## 2024-06-06 VITALS — BP 150/80 | Ht 59.0 in | Wt 144.0 lb

## 2024-06-06 DIAGNOSIS — M5030 Other cervical disc degeneration, unspecified cervical region: Secondary | ICD-10-CM

## 2024-06-06 NOTE — Progress Notes (Signed)
 Patient ID: ROCHANDA HARPHAM, female   DOB: 1941-02-25, 83 y.o.   MRN: [id number]  Cristiana presents today for follow-up on neck and left shoulder pain.  Left shoulder pain did improve with ultrasound-guided cortisone injection.  Neck pain persists however despite a trial of prednisone , gabapentin, and home health.  Recent x-rays of her cervical spine show multilevel degenerative changes including multilevel posterior facet arthropathy.  I spoke with her today about proceeding with an MRI of her cervical spine for preprocedural planning.  She will likely benefit from facet injections.  She would like to go ahead and proceed with that study.  I will follow-up with her via telephone with those results when available.  They prefer to have the injections done by Dr. Alm Lower in [place] if possible.  This note was dictated using Dragon naturally speaking software and may contain errors in syntax, spelling, or content which have not been identified prior to signing this note.

## 2024-06-08 DIAGNOSIS — M47896 Other spondylosis, lumbar region: Secondary | ICD-10-CM | POA: Diagnosis not present

## 2024-06-11 DIAGNOSIS — G231 Progressive supranuclear ophthalmoplegia [Steele-Richardson-Olszewski]: Secondary | ICD-10-CM | POA: Diagnosis not present

## 2024-06-13 DIAGNOSIS — N3281 Overactive bladder: Secondary | ICD-10-CM | POA: Diagnosis not present

## 2024-06-13 DIAGNOSIS — N319 Neuromuscular dysfunction of bladder, unspecified: Secondary | ICD-10-CM | POA: Diagnosis not present

## 2024-06-13 DIAGNOSIS — Z7409 Other reduced mobility: Secondary | ICD-10-CM | POA: Diagnosis not present

## 2024-06-13 DIAGNOSIS — R350 Frequency of micturition: Secondary | ICD-10-CM | POA: Diagnosis not present

## 2024-06-15 DIAGNOSIS — Z556 Problems related to health literacy: Secondary | ICD-10-CM | POA: Diagnosis not present

## 2024-06-18 DIAGNOSIS — M5023 Other cervical disc displacement, cervicothoracic region: Secondary | ICD-10-CM | POA: Diagnosis not present

## 2024-06-18 DIAGNOSIS — M50221 Other cervical disc displacement at C4-C5 level: Secondary | ICD-10-CM | POA: Diagnosis not present

## 2024-06-18 DIAGNOSIS — M5031 Other cervical disc degeneration,  high cervical region: Secondary | ICD-10-CM | POA: Diagnosis not present

## 2024-06-18 DIAGNOSIS — M5030 Other cervical disc degeneration, unspecified cervical region: Secondary | ICD-10-CM | POA: Diagnosis not present

## 2024-06-18 DIAGNOSIS — M50223 Other cervical disc displacement at C6-C7 level: Secondary | ICD-10-CM | POA: Diagnosis not present

## 2024-06-20 DIAGNOSIS — Z556 Problems related to health literacy: Secondary | ICD-10-CM | POA: Diagnosis not present

## 2024-06-20 DIAGNOSIS — M5030 Other cervical disc degeneration, unspecified cervical region: Secondary | ICD-10-CM | POA: Diagnosis not present

## 2024-06-20 DIAGNOSIS — M19012 Primary osteoarthritis, left shoulder: Secondary | ICD-10-CM | POA: Diagnosis not present

## 2024-06-20 DIAGNOSIS — Z7952 Long term (current) use of systemic steroids: Secondary | ICD-10-CM | POA: Diagnosis not present

## 2024-06-24 ENCOUNTER — Other Ambulatory Visit: Payer: Self-pay | Admitting: Family Medicine

## 2024-06-24 DIAGNOSIS — R258 Other abnormal involuntary movements: Secondary | ICD-10-CM

## 2024-06-26 ENCOUNTER — Other Ambulatory Visit (INDEPENDENT_AMBULATORY_CARE_PROVIDER_SITE_OTHER)

## 2024-06-26 DIAGNOSIS — M5030 Other cervical disc degeneration, unspecified cervical region: Secondary | ICD-10-CM

## 2024-06-26 NOTE — Telephone Encounter (Signed)
 Please contact pt's husband or daughter for F/U OV and labs. Thank you

## 2024-06-27 ENCOUNTER — Telehealth: Payer: Self-pay

## 2024-06-27 NOTE — Telephone Encounter (Signed)
  Please advise  Copied from CRM #[id number]. Topic: Clinical - Home Health Verbal Orders >> Jun 27, 2024  2:56 PM Delon DASEN wrote: Caller/Agency: Aulet with [facility] Number: [phone] Service Requested: Speech Therapy Frequency: n/a Any new concerns about the patient? Yes- Patient had a fall on Sunday, hit face, bruising around right eye, no bleeding, bruising on right hand, no pain but does have tenderness in right hand- has cuts to right hand where bruising is

## 2024-06-28 NOTE — Telephone Encounter (Signed)
 Ok for speech therapy. We are happy to work her in to look at her hand if she feels more than just bruising

## 2024-06-29 ENCOUNTER — Telehealth: Payer: Self-pay

## 2024-06-29 NOTE — Telephone Encounter (Signed)
 Spoke with Aulet with home health - she states that  the patient has been taking Tizanidine 4mg   - 1 1/2 tablest ( 6mg  ) two times daily as needed for muscle spasms since 05/15/2024 -  She states any medication changes should be relayed to family as she will not see the patient again until possibly next week - her scheduled is not yet made.

## 2024-06-29 NOTE — Telephone Encounter (Signed)
 Copied from CRM [id number]. Topic: Clinical - Prescription Issue >> Jun 29, 2024 11:54 AM Ivette P wrote: Reason for CRM: Home health aid called in to notify she received a warning about the following medications   propranolol  (INDERAL ) 40 MG tablet acyclovir  (ZOVIRAX ) 400 MG tablet   Mixing with the medicaiton tizanidine that she is currently taking   Already notified Tonuzi, Lirim, MD has not heard anything back    Callback for Aulet - home care [phone]

## 2024-06-29 NOTE — Telephone Encounter (Signed)
 Left message advising ok for speech therapy.

## 2024-06-29 NOTE — Telephone Encounter (Signed)
 I know the acyclovir  and the propranolol  or regular medications how long is she taking the muscle relaxer?  If she is just taking the tizanidine for a few days then she can hold the acyclovir  during that time and still take her propranolol .

## 2024-07-02 NOTE — Telephone Encounter (Signed)
 Aulet with home health informed.

## 2024-07-02 NOTE — Telephone Encounter (Signed)
 Returned a call back to [employer] @ [facility]. No answer. Left a vm msg that verbal order was provided for skilled nursing. She is aware that a response is pending from the provider regarding medication interactions. Direct call back information provided.

## 2024-07-02 NOTE — Telephone Encounter (Signed)
 Ok to continue current regimen sinc she is only taking the acyclovir  once a day

## 2024-07-02 NOTE — Telephone Encounter (Unsigned)
 Copied from CRM [id number]. Topic: Clinical - Home Health Verbal Orders >> Jul 02, 2024  8:38 AM Myrick T wrote: Caller/Agency: Aulet from [facility] Number: [phone] Service Requested: Skilled Nursing Frequency: 1x1w beginning this week Any new concerns about the patient? Yes Call her back  on the f/u with the medication interaction for Tizanidine 4mg   - 1 1/2 tablest ( 6mg  ), propranolol  (INDERAL ) 40 MG tablet and acyclovir  (ZOVIRAX ) 400 MG tablet

## 2024-07-04 DIAGNOSIS — R633 Feeding difficulties, unspecified: Secondary | ICD-10-CM | POA: Diagnosis not present

## 2024-07-04 DIAGNOSIS — R1311 Dysphagia, oral phase: Secondary | ICD-10-CM | POA: Diagnosis not present

## 2024-07-05 ENCOUNTER — Telehealth: Payer: Self-pay

## 2024-07-05 ENCOUNTER — Ambulatory Visit: Payer: Self-pay

## 2024-07-05 NOTE — Telephone Encounter (Signed)
 Copied from CRM #[id number]. Topic: Clinical - Home Health Verbal Orders >> Jul 05, 2024 11:48 AM Berwyn MATSU wrote: Caller/Agency: [facility]  Callback Number: [phone] Service Requested: Skilled Nursing/ Home health nurse  Frequency:  1 a week for 5 weeks Any new concerns about the patient? Yes  Per Home health nurse she is missing medications. Per patient she does not want to take the following medications.  gabapentin (NEURONTIN) 100 MG capsule alendronate  (FOSAMAX ) 70 MG tablet  Per HH she is missing oxybutynin  5mg  ,Viibryd   10mg , descure 10mg , wellbutrin  300 mg    Patient has 25mg  of Zoloft  but referral states 100mg  may youy please verify.   Patient also has Gemtesa 75 mg tablet and per patient she is taking that as well.   Per home health may you please fax a list of medications patient is currently taking as there is a lot of confusion.   Per Morna she is requesting a call back and fax of medication list.   Fax: [phone]

## 2024-07-05 NOTE — Telephone Encounter (Signed)
 FYI Only or Action Required?: Action required by provider: referral request, clinical question for provider, update on patient condition, and request for documentation or forms.  Patient was last seen in primary care on 04/16/2024 by Curtis Debby PARAS, MD.  Called Nurse Triage reporting Fall.  Symptoms began No symptoms at this time.  Interventions attempted: Other: Home Health Care.  Symptoms are: stable.  Triage Disposition: Call PCP Now, Call PCP Within 24 Hours  Patient/caregiver understands and will follow disposition?: Yes  FYI- HHC Nurse Manuelita called to report that patient fell out of bed on 10/5. Pt has some bruising around her right eye and on right hand with a skin tear that is healing. Per chart review, PCP was made aware of fall on 10/8, no appt required at that time. Per Manuelita, pt denies any symptoms at this time and bruising appears to be healing.    Also, Manuelita reports during her initial eval today there are several discrepancies with patient's med list. Pt reports she no longer wants to take Fosomax or Gabapentin.  Also, Manuelita needs clarity if patient should be taking Vesicare  10mg , Oxybutynin  5mg , Wellbutrin  300mg , and Vilazodone  10mg . Morna also reported that the med list she has indicates Zoloft  300mg  vs 25mg  that Dr. Parnell has prescribed. Pt is also taking Gentasa? 75mg  Daily.  Manuelita is requesting an updated medication list to be faxed to [facility] Fax # [phone].  Manuelita also requesting verbal order for [facility] nurse visits once a week x 5 weeks for medicine education.  Will forward HP to clinic to expedite request.   Copied from CRM #[id number]. Topic: Clinical - Red Word Triage >> Jul 05, 2024 12:04 PM Gustabo BIRCH wrote:  health nurse [facility]- Pt said she fell Jun 24, 2024 out of bed and hit her bed and bruising on right eye and right hand. Reason for Disposition  [1] Follow-up call from patient regarding patient's clinical status AND [2]  information NON-URGENT  [1] Caller has URGENT medicine question about med that primary care doctor (or NP/PA) or specialist prescribed AND [2] triager unable to answer question  Answer Assessment - Initial Assessment Questions 1. NAME of MEDICINE: What medicine(s) are you calling about?     Gabapentin, Fosamax , Vesicare , Oxybutynin , Wellbutrin , Vilazodone , Zoloft , Gentasa,  2. QUESTION: What is your question? (e.g., double dose of medicine, side effect)     HHC Nurse Lindesy calling to get update med list from PCP  3. PRESCRIBER: Who prescribed the medicine? Reason: if prescribed by specialist, call should be referred to that group.     Dr. Alvan  4. SYMPTOMS: Do you have any symptoms? If Yes, ask: What symptoms are you having?  How bad are the symptoms (e.g., mild, moderate, severe)     No  5. PREGNANCY:  Is there any chance that you are pregnant? When was your last menstrual period?     No  Answer Assessment - Initial Assessment Questions 1. REASON FOR CALL or QUESTION: What is your reason for calling today? or How can I best     HHC Called to advise of patient's fall on 10/5  2. CALLER: Document the source of call. (e.g., laboratory staff, caregiver or patient).     Manuelita, [phone]- [facility]  Protocols used: PCP Call - No Triage-A-AH, Medication Question Call-A-AH

## 2024-07-05 NOTE — Telephone Encounter (Signed)
 2nd message received regarding mediation confusion. Do we need to schedule the patient for a medication reconciliation appt ?

## 2024-07-06 NOTE — Telephone Encounter (Signed)
 This task has been completed as requested. Lindsey @ [facility] was notified of the provider's update regarding the patient's current medication regimen. Verbal order provided for skilled nursing. No other inquiries during the call. No further action needed.

## 2024-07-06 NOTE — Telephone Encounter (Signed)
 OK to not take the Neurontin  She is on Gemtesa (NOT oxybutynin ) - the oxy was stopped previously.    I think Viibryd  was too expensive so should be on sertraline  25mg  (not 100).   Off of Buproprion too.    Not sure what Descure is???? (Donepazil?)

## 2024-07-13 ENCOUNTER — Telehealth: Payer: Self-pay

## 2024-07-13 NOTE — Telephone Encounter (Signed)
 Copied from CRM [id number]. Topic: Clinical - Medical Advice >> Jul 13, 2024  1:38 PM Montie POUR wrote: Reason for CRM:  [name] is calling to let [name]'s doctor know that she refused skilled nursing with [facility].

## 2024-07-16 ENCOUNTER — Ambulatory Visit: Admitting: Family Medicine

## 2024-07-25 ENCOUNTER — Telehealth: Payer: Self-pay

## 2024-07-25 NOTE — Telephone Encounter (Signed)
 Yes please hold the tizanidine

## 2024-07-25 NOTE — Telephone Encounter (Signed)
 Copied from CRM [id number]. Topic: Clinical - Prescription Issue >> Jul 25, 2024  2:46 PM Charlet HERO wrote: Reason for CRM: Alet with [facility] [phone] is calling to get asst with the patient seeing neurologist that she got prescribed 100 mg gabapentin and it gave her a warning for interaction with trazanadine. The patient was informed of the information and the patient would stop the trazanadine to take the gabapentin to help with the pain. Please call the patient asap.The patient has also been discharged from the provider starting today.

## 2024-07-26 NOTE — Telephone Encounter (Signed)
 LM for pt to hold tizanidine per Dr. Alvan and to call the office with any questions or concerns.

## 2024-08-01 ENCOUNTER — Telehealth: Payer: Self-pay

## 2024-08-01 DIAGNOSIS — R2681 Unsteadiness on feet: Secondary | ICD-10-CM

## 2024-08-01 NOTE — Telephone Encounter (Unsigned)
 Copied from CRM [id number]. Topic: General - Other >> Aug 01, 2024 11:10 AM Sophia H wrote: Reason for CRM: Spoke with Onita [facility] Advocate who is calling in to request home health for the patient. Advised her that on 10/24 an aid was sent out but the patient refused and did ask for clarification on starting back up with that. Unsure of the process since she originally refused, please reach out to patient and advise. [phone]

## 2024-08-01 NOTE — Telephone Encounter (Signed)
 Called Patient - she gave me verbal permission to speak with her caregiver - ANN- Jenkins states that her and the patient discussed this this morning and is agreeable to home health coming to her home.   - how would you like to proceed with this? Will we need to place a new referral ? The number listed in chart is for the patient caregiver Arlean  and not for the [facility] advocate who called for the patient.   Also states patient fell on Thursday 07/26/2024 and  R foot is bruised but can bear weight. She is wanting to know if an x-ray could be ordered to be done at the patient's home since she is not able to walk to get to the office?

## 2024-08-01 NOTE — Telephone Encounter (Signed)
 I would recommend that we call the patient directly and see if she is interested before we could on the path of placing another referral.

## 2024-08-03 NOTE — Telephone Encounter (Signed)
 Please review the home health pended referral for accuracy before sending

## 2024-08-03 NOTE — Telephone Encounter (Signed)
 Spoke with ann - continuing conversation that we had been given verbal approval to speak with her - She states the foot is healing somewhat - she will likely give this the weekend to see if things are improving or if they want to proceed with the mobile order  for x-ray - she will reach out to let  us  know.   She is requesting the referral to  home health for patient .

## 2024-08-03 NOTE — Telephone Encounter (Signed)
 Spoke with [facility] mobile x-ray Phone: [phone]  could go to patient home to have digital x-ray done - they do accept all insurances.  Would need patient face sheet / demographics and signed order from physician-  Will need to fax to [phone] Current turnaround time would be  a few days on average as high volume currently  Called patient home in hopes of continuing to speak with patient care giver ann She will not be in at the home until after 10:20 am today  Left my direct number for return contact number - requested a return call    Wanted to be sure as to what services are being requested  for home health exactly to include on order  and  to see if agreeable to sending the home x-ray department as above

## 2024-08-12 ENCOUNTER — Other Ambulatory Visit: Payer: Self-pay | Admitting: Family Medicine

## 2024-08-12 DIAGNOSIS — N3281 Overactive bladder: Secondary | ICD-10-CM

## 2024-08-20 ENCOUNTER — Ambulatory Visit (INDEPENDENT_AMBULATORY_CARE_PROVIDER_SITE_OTHER): Admitting: Family Medicine

## 2024-08-20 ENCOUNTER — Other Ambulatory Visit: Payer: Self-pay | Admitting: Family Medicine

## 2024-08-20 ENCOUNTER — Encounter: Payer: Self-pay | Admitting: Family Medicine

## 2024-08-20 VITALS — BP 132/78 | HR 62 | Ht 59.0 in | Wt 148.0 lb

## 2024-08-20 DIAGNOSIS — H65192 Other acute nonsuppurative otitis media, left ear: Secondary | ICD-10-CM

## 2024-08-20 DIAGNOSIS — I10 Essential (primary) hypertension: Secondary | ICD-10-CM

## 2024-08-20 DIAGNOSIS — Z23 Encounter for immunization: Secondary | ICD-10-CM | POA: Diagnosis not present

## 2024-08-20 DIAGNOSIS — E785 Hyperlipidemia, unspecified: Secondary | ICD-10-CM | POA: Diagnosis not present

## 2024-08-20 DIAGNOSIS — G231 Progressive supranuclear ophthalmoplegia [Steele-Richardson-Olszewski]: Secondary | ICD-10-CM

## 2024-08-20 DIAGNOSIS — R1319 Other dysphagia: Secondary | ICD-10-CM

## 2024-08-20 NOTE — Progress Notes (Signed)
 Established Patient Office Visit  Patient ID: Nancy Wilson, female    DOB: [birth year]  Age: 83 y.o. MRN: [id number] PCP: Alvan Nancy BIRCH, MD  Chief Complaint  Patient presents with   Hypertension    Subjective:     HPI  Discussed the use of AI scribe software for clinical note transcription with the patient, who gave verbal consent to proceed.  History of Present Illness Nancy Wilson is an 83 year old female who presents for follow-up on her bladder medication and other health concerns.  Urinary symptoms and bladder medication - Recently started a new bladder medication prescribed by her gynecologist - Insurance covers all but $47 for a 30-day supply - Monitors blood pressure at home due to potential impact of bladder medication - No concerning blood pressure readings  Mobility and fall risk - Uses a wheelchair for mobility around the house - No frequent falls  Swallowing difficulties and head pain - Experiences chest pain when eating, attributed to swallowing difficulties - Has seen a gastroenterologist for evaluation - Has not had esophageal dilation - Able to swallow large sodium pill with water  Ear pain and sinus symptoms - Experiencing ear pain - Uses saline nasal spray - No current use of medicated nasal spray - Ear pain attributed to sinus issues  Cognitive impairment and psychiatric medication - On donepezil  for dementia, with a prescription for a 90-day supply - Advised to taper off sertraline , previously taken at a low dose  General symptoms - No swelling - No chest pain - No breathing problems  Immunization status - Received influenza vaccination today Recently followed up with gynecology for overactive bladder and neurogenic bladder their recommendation was to stop the oxybutynin  and Vibegron but unfortunately it was too costly.  They have decided to do a trial of Myrbetriq 50 mg daily instead.  And consider Botox.. ROS    Objective:      BP 132/78   Pulse 62   Ht 4' 11 (1.499 m)   Wt 148 lb (67.1 kg)   SpO2 100%   BMI 29.89 kg/m    Physical Exam Vitals and nursing note reviewed.  Constitutional:      Appearance: Normal appearance.  HENT:     Head: Normocephalic and atraumatic.     Right Ear: Tympanic membrane, ear canal and external ear normal.     Left Ear: Ear canal and external ear normal.     Ears:     Comments: Effusion left TM  Eyes:     Conjunctiva/sclera: Conjunctivae normal.  Cardiovascular:     Rate and Rhythm: Normal rate and regular rhythm.  Pulmonary:     Effort: Pulmonary effort is normal.     Breath sounds: Normal breath sounds.  Skin:    General: Skin is warm and dry.  Neurological:     Mental Status: She is alert.  Psychiatric:        Mood and Affect: Mood normal.      No results found for any visits on 08/20/24.    The ASCVD Risk score (Arnett DK, et al., 2019) failed to calculate for the following reasons:   The 2019 ASCVD risk score is only valid for ages 68 to 85    Assessment & Plan:   Problem List Items Addressed This Visit       Cardiovascular and Mediastinum   HYPERTENSION, BENIGN - Primary   Essential hypertension Blood pressure well-controlled, no recent hypotension or hypertension. - Monitor blood  pressure periodically, especially with urinary medication.      Relevant Orders   CMP14+EGFR   Lipid panel     Nervous and Auditory   PSP (progressive supranuclear palsy) (HCC)   Relevant Orders   CMP14+EGFR   Lipid panel     Other   Hyperlipidemia   Relevant Orders   CMP14+EGFR   Lipid panel   Other Visit Diagnoses       Encounter for immunization       Relevant Orders   Flu vaccine HIGH DOSE PF(Fluzone Trivalent) (Completed)     Acute effusion of left ear         Esophageal dysphagia       Relevant Orders   Ambulatory referral to Gastroenterology       Assessment and Plan Assessment & Plan   Neurocognitive disorder (dementia) On  donepezil  with no recent cognitive changes. - Ensure donepezil  is dispensed as a 90-day supply.  Urinary incontinence Trialing new bladder medication, no adverse effects, $77 copay for 30-day supply. - Consider 90-day supply if insurance allows to reduce cost.  Eustachian tube dysfunction with left middle ear effusion Left ear pain with fluid, likely sinus-related, no infection. - Use nasal spray (Flonase , Nasonex, or Rynacort) for 2-4 weeks. - Use saline nasal spray before medicated nasal spray.  Dysphagia, oropharyngeal Intermittent sensation of pills getting stuck, no prior esophageal dilation. - Consider GI referral for possible esophageal stricture if symptoms persist.  Depression, mild  On sertraline  25 mg, plan to taper off due to low dose and minimal symptoms. - Taper sertraline  by taking it every other day for 10 days, then discontinue.  General Health Maintenance Received flu shot, discussed shingles vaccine coverage under Medicare Part D. - Consider obtaining shingles vaccine at a pharmacy.    Return in about 6 months (around 02/18/2025) for Hypertension.    Nancy Byars, MD [facility] at [facility]

## 2024-08-20 NOTE — Assessment & Plan Note (Signed)
 Essential hypertension Blood pressure well-controlled, no recent hypotension or hypertension. - Monitor blood pressure periodically, especially with urinary medication.

## 2024-08-20 NOTE — Patient Instructions (Signed)
 Recommend Flonase  ( fluticasone ) nasal spray in each nostril daily for 3 weeks to see if ear is feeling some better.    OK to take the sertraline  every other day for 10 days and then stop.

## 2024-08-21 ENCOUNTER — Ambulatory Visit: Payer: Self-pay | Admitting: Family Medicine

## 2024-08-21 LAB — CMP14+EGFR
ALT: 5 IU/L (ref 0–32)
AST: 18 IU/L (ref 0–40)
Albumin: 4.2 g/dL (ref 3.7–4.7)
Alkaline Phosphatase: 101 IU/L (ref 48–129)
BUN/Creatinine Ratio: 24 (ref 12–28)
BUN: 25 mg/dL (ref 8–27)
Bilirubin Total: 0.3 mg/dL (ref 0.0–1.2)
CO2: 21 mmol/L (ref 20–29)
Calcium: 9.7 mg/dL (ref 8.7–10.3)
Chloride: 102 mmol/L (ref 96–106)
Creatinine, Ser: 1.04 mg/dL — ABNORMAL HIGH (ref 0.57–1.00)
Globulin, Total: 2.3 g/dL (ref 1.5–4.5)
Glucose: 90 mg/dL (ref 70–99)
Potassium: 4.8 mmol/L (ref 3.5–5.2)
Sodium: 140 mmol/L (ref 134–144)
Total Protein: 6.5 g/dL (ref 6.0–8.5)
eGFR: 54 mL/min/1.73 — ABNORMAL LOW (ref >59)

## 2024-08-21 LAB — LIPID PANEL
Chol/HDL Ratio: 4.7 ratio — ABNORMAL HIGH (ref 0.0–4.4)
Cholesterol, Total: 252 mg/dL — ABNORMAL HIGH (ref 100–199)
HDL: 54 mg/dL (ref >39)
LDL Chol Calc (NIH): 170 mg/dL — ABNORMAL HIGH (ref 0–99)
Triglycerides: 153 mg/dL — ABNORMAL HIGH (ref 0–149)
VLDL Cholesterol Cal: 28 mg/dL (ref 5–40)

## 2024-08-21 NOTE — Progress Notes (Signed)
 Hi Nancy Wilson, kidney function is stable in fact it actually looks a little bit better this time.  Liver enzymes are normal.  LDL and total cholesterol are high.  Make sure that you are taking your atorvastatin  nightly.  We could always increase your dose to better control your cholesterol if you are okay with that then please let us  know.

## 2024-08-31 ENCOUNTER — Telehealth: Payer: Self-pay

## 2024-08-31 NOTE — Progress Notes (Signed)
° °  08/31/2024  Patient ID: Nancy Wilson, female   DOB: [birth year], 83 y.o.   MRN: [id number]  This patient is appearing on a report for being at risk of failing the adherence measure for hypertension (ACEi/ARB) medications this calendar year.   Medication: amlodipine /olmesartan  5/40mg  Last fill date: 04/04/24 for 90 day supply  MyChart message sent to patient.  Nancy Wilson, PharmD, DPLA

## 2024-09-10 ENCOUNTER — Other Ambulatory Visit: Payer: Self-pay | Admitting: Family Medicine

## 2024-09-10 ENCOUNTER — Encounter: Payer: Self-pay | Admitting: Family Medicine

## 2024-09-11 ENCOUNTER — Other Ambulatory Visit: Payer: Self-pay | Admitting: *Deleted

## 2024-10-25 ENCOUNTER — Encounter: Payer: Self-pay | Admitting: Family Medicine

## 2024-10-25 MED ORDER — SODIUM CHLORIDE 1 G PO TABS: 1.0000 g | ORAL_TABLET | Freq: Three times a day (TID) | ORAL | 0 refills | Status: AC

## 2024-10-25 NOTE — Telephone Encounter (Signed)
 Requesting rx rf of Sodium chloride  1G Last written 05/16/2024 Last OV 08/20/2024 Upcoming appt 02/18/2025

## 2025-02-18 ENCOUNTER — Ambulatory Visit: Admitting: Family Medicine
